# Patient Record
Sex: Male | Born: 1950 | Race: Black or African American | Hispanic: No | Marital: Married | Smoking: Current some day smoker
Health system: Southern US, Community
[De-identification: ages and names within clinical notes are randomized; demographics above are authoritative.]

## PROBLEM LIST (undated history)

## (undated) DIAGNOSIS — M1712 Unilateral primary osteoarthritis, left knee: Secondary | ICD-10-CM

## (undated) DIAGNOSIS — J449 Chronic obstructive pulmonary disease, unspecified: Secondary | ICD-10-CM

## (undated) DIAGNOSIS — I209 Angina pectoris, unspecified: Secondary | ICD-10-CM

## (undated) DIAGNOSIS — I2510 Atherosclerotic heart disease of native coronary artery without angina pectoris: Secondary | ICD-10-CM

## (undated) DIAGNOSIS — C801 Malignant (primary) neoplasm, unspecified: Secondary | ICD-10-CM

## (undated) DIAGNOSIS — K759 Inflammatory liver disease, unspecified: Secondary | ICD-10-CM

## (undated) DIAGNOSIS — E785 Hyperlipidemia, unspecified: Secondary | ICD-10-CM

## (undated) DIAGNOSIS — C189 Malignant neoplasm of colon, unspecified: Secondary | ICD-10-CM

## (undated) DIAGNOSIS — R0602 Shortness of breath: Secondary | ICD-10-CM

## (undated) DIAGNOSIS — I10 Essential (primary) hypertension: Secondary | ICD-10-CM

## (undated) DIAGNOSIS — D689 Coagulation defect, unspecified: Secondary | ICD-10-CM

## (undated) HISTORY — PX: REPLACEMENT TOTAL KNEE: SUR1224

## (undated) HISTORY — DX: Malignant neoplasm of colon, unspecified: C18.9

## (undated) HISTORY — DX: Hyperlipidemia, unspecified: E78.5

## (undated) HISTORY — DX: Chronic obstructive pulmonary disease, unspecified: J44.9

## (undated) HISTORY — PX: JOINT REPLACEMENT: SHX530

## (undated) HISTORY — DX: Coagulation defect, unspecified: D68.9

## (undated) HISTORY — PX: COLON SURGERY: SHX602

---

## 2000-02-03 ENCOUNTER — Encounter: Payer: Self-pay | Admitting: Emergency Medicine

## 2000-02-03 ENCOUNTER — Emergency Department (HOSPITAL_COMMUNITY): Admission: EM | Admit: 2000-02-03 | Discharge: 2000-02-03 | Payer: Self-pay | Admitting: Emergency Medicine

## 2000-02-07 ENCOUNTER — Encounter: Admission: RE | Admit: 2000-02-07 | Discharge: 2000-02-07 | Payer: Self-pay | Admitting: Internal Medicine

## 2000-02-08 ENCOUNTER — Encounter: Admission: RE | Admit: 2000-02-08 | Discharge: 2000-02-08 | Payer: Self-pay | Admitting: Hematology and Oncology

## 2000-03-24 ENCOUNTER — Encounter: Admission: RE | Admit: 2000-03-24 | Discharge: 2000-03-24 | Payer: Self-pay

## 2000-05-25 ENCOUNTER — Ambulatory Visit (HOSPITAL_COMMUNITY): Admission: RE | Admit: 2000-05-25 | Discharge: 2000-05-25 | Payer: Self-pay | Admitting: Gastroenterology

## 2000-05-25 ENCOUNTER — Encounter (INDEPENDENT_AMBULATORY_CARE_PROVIDER_SITE_OTHER): Payer: Self-pay | Admitting: Specialist

## 2000-05-31 ENCOUNTER — Encounter (HOSPITAL_BASED_OUTPATIENT_CLINIC_OR_DEPARTMENT_OTHER): Payer: Self-pay | Admitting: General Surgery

## 2000-06-01 ENCOUNTER — Inpatient Hospital Stay (HOSPITAL_COMMUNITY): Admission: RE | Admit: 2000-06-01 | Discharge: 2000-06-10 | Payer: Self-pay | Admitting: General Surgery

## 2000-06-01 ENCOUNTER — Encounter (INDEPENDENT_AMBULATORY_CARE_PROVIDER_SITE_OTHER): Payer: Self-pay

## 2000-06-02 ENCOUNTER — Encounter (HOSPITAL_BASED_OUTPATIENT_CLINIC_OR_DEPARTMENT_OTHER): Payer: Self-pay | Admitting: General Surgery

## 2000-06-05 ENCOUNTER — Encounter (HOSPITAL_BASED_OUTPATIENT_CLINIC_OR_DEPARTMENT_OTHER): Payer: Self-pay | Admitting: General Surgery

## 2000-06-09 ENCOUNTER — Encounter (HOSPITAL_BASED_OUTPATIENT_CLINIC_OR_DEPARTMENT_OTHER): Payer: Self-pay | Admitting: General Surgery

## 2000-06-28 ENCOUNTER — Encounter: Payer: Self-pay | Admitting: *Deleted

## 2000-06-28 ENCOUNTER — Ambulatory Visit (HOSPITAL_COMMUNITY): Admission: RE | Admit: 2000-06-28 | Discharge: 2000-06-28 | Payer: Self-pay | Admitting: Internal Medicine

## 2000-06-29 ENCOUNTER — Encounter: Admission: RE | Admit: 2000-06-29 | Discharge: 2000-09-27 | Payer: Self-pay | Admitting: Radiation Oncology

## 2000-07-21 ENCOUNTER — Ambulatory Visit (HOSPITAL_COMMUNITY): Admission: RE | Admit: 2000-07-21 | Discharge: 2000-07-21 | Payer: Self-pay | Admitting: General Surgery

## 2000-07-21 ENCOUNTER — Encounter (HOSPITAL_BASED_OUTPATIENT_CLINIC_OR_DEPARTMENT_OTHER): Payer: Self-pay | Admitting: General Surgery

## 2000-08-03 ENCOUNTER — Ambulatory Visit (HOSPITAL_COMMUNITY): Admission: RE | Admit: 2000-08-03 | Discharge: 2000-08-03 | Payer: Self-pay | Admitting: Gastroenterology

## 2000-08-03 ENCOUNTER — Encounter (INDEPENDENT_AMBULATORY_CARE_PROVIDER_SITE_OTHER): Payer: Self-pay | Admitting: Specialist

## 2000-09-29 ENCOUNTER — Inpatient Hospital Stay (HOSPITAL_COMMUNITY): Admission: EM | Admit: 2000-09-29 | Discharge: 2000-10-07 | Payer: Self-pay | Admitting: Emergency Medicine

## 2000-09-30 ENCOUNTER — Encounter (HOSPITAL_COMMUNITY): Payer: Self-pay | Admitting: Oncology

## 2000-10-02 ENCOUNTER — Encounter (HOSPITAL_COMMUNITY): Payer: Self-pay | Admitting: Oncology

## 2000-10-03 ENCOUNTER — Encounter (HOSPITAL_COMMUNITY): Payer: Self-pay | Admitting: Oncology

## 2000-10-06 ENCOUNTER — Encounter (HOSPITAL_COMMUNITY): Payer: Self-pay | Admitting: Oncology

## 2000-10-30 ENCOUNTER — Ambulatory Visit (HOSPITAL_COMMUNITY): Admission: RE | Admit: 2000-10-30 | Discharge: 2000-10-30 | Payer: Self-pay | Admitting: Oncology

## 2000-10-30 ENCOUNTER — Encounter (HOSPITAL_COMMUNITY): Payer: Self-pay | Admitting: Oncology

## 2001-03-03 ENCOUNTER — Encounter: Payer: Self-pay | Admitting: Emergency Medicine

## 2001-03-03 ENCOUNTER — Emergency Department (HOSPITAL_COMMUNITY): Admission: EM | Admit: 2001-03-03 | Discharge: 2001-03-03 | Payer: Self-pay | Admitting: Emergency Medicine

## 2001-03-14 DIAGNOSIS — I2510 Atherosclerotic heart disease of native coronary artery without angina pectoris: Secondary | ICD-10-CM | POA: Insufficient documentation

## 2001-03-16 ENCOUNTER — Encounter: Payer: Self-pay | Admitting: Cardiology

## 2001-03-16 ENCOUNTER — Ambulatory Visit (HOSPITAL_COMMUNITY): Admission: RE | Admit: 2001-03-16 | Discharge: 2001-03-16 | Payer: Self-pay | Admitting: Cardiology

## 2001-03-29 ENCOUNTER — Ambulatory Visit (HOSPITAL_COMMUNITY): Admission: RE | Admit: 2001-03-29 | Discharge: 2001-03-30 | Payer: Self-pay | Admitting: Cardiology

## 2001-04-15 ENCOUNTER — Inpatient Hospital Stay (HOSPITAL_COMMUNITY): Admission: EM | Admit: 2001-04-15 | Discharge: 2001-04-17 | Payer: Self-pay | Admitting: Emergency Medicine

## 2001-04-15 ENCOUNTER — Encounter: Payer: Self-pay | Admitting: Emergency Medicine

## 2001-04-16 ENCOUNTER — Encounter: Payer: Self-pay | Admitting: Cardiology

## 2001-06-22 ENCOUNTER — Encounter: Payer: Self-pay | Admitting: Gastroenterology

## 2001-06-22 ENCOUNTER — Encounter: Admission: RE | Admit: 2001-06-22 | Discharge: 2001-06-22 | Payer: Self-pay | Admitting: Gastroenterology

## 2001-07-02 ENCOUNTER — Ambulatory Visit: Admission: RE | Admit: 2001-07-02 | Discharge: 2001-07-02 | Payer: Self-pay | Admitting: Oncology

## 2001-07-12 ENCOUNTER — Ambulatory Visit (HOSPITAL_COMMUNITY): Admission: RE | Admit: 2001-07-12 | Discharge: 2001-07-12 | Payer: Self-pay | Admitting: Oncology

## 2001-07-12 ENCOUNTER — Encounter (HOSPITAL_COMMUNITY): Payer: Self-pay | Admitting: Oncology

## 2001-07-12 ENCOUNTER — Encounter (INDEPENDENT_AMBULATORY_CARE_PROVIDER_SITE_OTHER): Payer: Self-pay | Admitting: Specialist

## 2001-12-24 ENCOUNTER — Encounter (HOSPITAL_COMMUNITY): Payer: Self-pay | Admitting: Oncology

## 2001-12-24 ENCOUNTER — Ambulatory Visit (HOSPITAL_COMMUNITY): Admission: RE | Admit: 2001-12-24 | Discharge: 2001-12-24 | Payer: Self-pay | Admitting: Oncology

## 2002-01-08 ENCOUNTER — Ambulatory Visit (HOSPITAL_COMMUNITY): Admission: RE | Admit: 2002-01-08 | Discharge: 2002-01-08 | Payer: Self-pay | Admitting: Cardiology

## 2002-01-11 ENCOUNTER — Ambulatory Visit (HOSPITAL_COMMUNITY): Admission: RE | Admit: 2002-01-11 | Discharge: 2002-01-11 | Payer: Self-pay | Admitting: General Surgery

## 2002-04-22 ENCOUNTER — Encounter (INDEPENDENT_AMBULATORY_CARE_PROVIDER_SITE_OTHER): Payer: Self-pay | Admitting: Specialist

## 2002-04-22 ENCOUNTER — Ambulatory Visit (HOSPITAL_COMMUNITY): Admission: RE | Admit: 2002-04-22 | Discharge: 2002-04-22 | Payer: Self-pay | Admitting: Gastroenterology

## 2002-08-28 ENCOUNTER — Ambulatory Visit (HOSPITAL_COMMUNITY): Admission: RE | Admit: 2002-08-28 | Discharge: 2002-08-28 | Payer: Self-pay | Admitting: Cardiology

## 2002-08-28 ENCOUNTER — Encounter: Payer: Self-pay | Admitting: Cardiology

## 2002-10-15 ENCOUNTER — Ambulatory Visit (HOSPITAL_COMMUNITY): Admission: RE | Admit: 2002-10-15 | Discharge: 2002-10-15 | Payer: Self-pay | Admitting: Oncology

## 2002-10-15 ENCOUNTER — Encounter (HOSPITAL_COMMUNITY): Payer: Self-pay | Admitting: Oncology

## 2003-01-02 ENCOUNTER — Encounter (INDEPENDENT_AMBULATORY_CARE_PROVIDER_SITE_OTHER): Payer: Self-pay | Admitting: Specialist

## 2003-01-02 ENCOUNTER — Encounter: Payer: Self-pay | Admitting: Gastroenterology

## 2003-01-02 ENCOUNTER — Ambulatory Visit (HOSPITAL_COMMUNITY): Admission: RE | Admit: 2003-01-02 | Discharge: 2003-01-02 | Payer: Self-pay | Admitting: Gastroenterology

## 2003-01-20 ENCOUNTER — Ambulatory Visit (HOSPITAL_COMMUNITY): Admission: RE | Admit: 2003-01-20 | Discharge: 2003-01-20 | Payer: Self-pay | Admitting: Cardiology

## 2004-02-20 ENCOUNTER — Ambulatory Visit (HOSPITAL_COMMUNITY): Admission: RE | Admit: 2004-02-20 | Discharge: 2004-02-20 | Payer: Self-pay | Admitting: Oncology

## 2004-03-01 ENCOUNTER — Ambulatory Visit: Payer: Self-pay | Admitting: Oncology

## 2004-04-05 ENCOUNTER — Encounter (INDEPENDENT_AMBULATORY_CARE_PROVIDER_SITE_OTHER): Payer: Self-pay | Admitting: *Deleted

## 2004-04-05 ENCOUNTER — Ambulatory Visit (HOSPITAL_COMMUNITY): Admission: RE | Admit: 2004-04-05 | Discharge: 2004-04-05 | Payer: Self-pay | Admitting: Gastroenterology

## 2004-04-12 ENCOUNTER — Ambulatory Visit: Payer: Self-pay | Admitting: Gastroenterology

## 2004-06-25 ENCOUNTER — Ambulatory Visit: Payer: Self-pay | Admitting: Oncology

## 2004-09-06 ENCOUNTER — Ambulatory Visit: Payer: Self-pay | Admitting: Oncology

## 2005-04-07 ENCOUNTER — Ambulatory Visit: Payer: Self-pay | Admitting: Oncology

## 2005-04-08 ENCOUNTER — Ambulatory Visit (HOSPITAL_COMMUNITY): Admission: RE | Admit: 2005-04-08 | Discharge: 2005-04-08 | Payer: Self-pay | Admitting: Oncology

## 2005-04-13 ENCOUNTER — Ambulatory Visit: Payer: Self-pay | Admitting: Family Medicine

## 2005-04-26 ENCOUNTER — Ambulatory Visit: Payer: Self-pay | Admitting: *Deleted

## 2005-07-20 ENCOUNTER — Encounter (HOSPITAL_COMMUNITY): Admission: RE | Admit: 2005-07-20 | Discharge: 2005-10-18 | Payer: Self-pay | Admitting: Cardiology

## 2005-07-29 ENCOUNTER — Ambulatory Visit: Payer: Self-pay | Admitting: Gastroenterology

## 2005-09-27 ENCOUNTER — Ambulatory Visit: Payer: Self-pay | Admitting: Family Medicine

## 2005-10-18 ENCOUNTER — Ambulatory Visit: Payer: Self-pay | Admitting: Oncology

## 2005-11-26 ENCOUNTER — Inpatient Hospital Stay (HOSPITAL_COMMUNITY): Admission: EM | Admit: 2005-11-26 | Discharge: 2005-11-29 | Payer: Self-pay | Admitting: Emergency Medicine

## 2006-01-26 ENCOUNTER — Ambulatory Visit: Payer: Self-pay | Admitting: Oncology

## 2006-02-06 LAB — COMPREHENSIVE METABOLIC PANEL
AST: 52 U/L — ABNORMAL HIGH (ref 0–37)
Alkaline Phosphatase: 107 U/L (ref 39–117)
BUN: 9 mg/dL (ref 6–23)
Creatinine, Ser: 1.26 mg/dL (ref 0.40–1.50)
Glucose, Bld: 93 mg/dL (ref 70–99)

## 2006-02-06 LAB — LACTATE DEHYDROGENASE: LDH: 197 U/L (ref 94–250)

## 2006-02-06 LAB — CBC WITH DIFFERENTIAL/PLATELET
BASO%: 1.3 % (ref 0.0–2.0)
Basophils Absolute: 0.1 10e3/uL (ref 0.0–0.1)
EOS%: 1.4 % (ref 0.0–7.0)
Eosinophils Absolute: 0.1 10e3/uL (ref 0.0–0.5)
HCT: 46.5 % (ref 38.7–49.9)
HGB: 16.3 g/dL (ref 13.0–17.1)
LYMPH%: 29.3 % (ref 14.0–48.0)
MCH: 33.3 pg (ref 28.0–33.4)
MCHC: 35 g/dL (ref 32.0–35.9)
MCV: 95.2 fL (ref 81.6–98.0)
MONO#: 0.4 10e3/uL (ref 0.1–0.9)
MONO%: 5.9 % (ref 0.0–13.0)
NEUT#: 4.5 10e3/uL (ref 1.5–6.5)
NEUT%: 62 % (ref 40.0–75.0)
Platelets: 223 10e3/uL (ref 145–400)
RBC: 4.89 10e6/uL (ref 4.20–5.71)
RDW: 12 % (ref 11.2–14.6)
WBC: 7.3 10e3/uL (ref 4.0–10.0)
lymph#: 2.2 10e3/uL (ref 0.9–3.3)

## 2006-02-06 LAB — CEA: CEA: 0.7 ng/mL (ref 0.0–5.0)

## 2006-03-29 ENCOUNTER — Ambulatory Visit (HOSPITAL_COMMUNITY): Admission: RE | Admit: 2006-03-29 | Discharge: 2006-03-29 | Payer: Self-pay | Admitting: Oncology

## 2006-04-30 ENCOUNTER — Emergency Department (HOSPITAL_COMMUNITY): Admission: EM | Admit: 2006-04-30 | Discharge: 2006-05-01 | Payer: Self-pay | Admitting: Emergency Medicine

## 2006-07-27 ENCOUNTER — Ambulatory Visit: Payer: Self-pay | Admitting: Oncology

## 2006-08-21 ENCOUNTER — Ambulatory Visit (HOSPITAL_COMMUNITY): Admission: RE | Admit: 2006-08-21 | Discharge: 2006-08-21 | Payer: Self-pay | Admitting: Oncology

## 2006-08-21 LAB — COMPREHENSIVE METABOLIC PANEL
AST: 50 U/L — ABNORMAL HIGH (ref 0–37)
Albumin: 4 g/dL (ref 3.5–5.2)
Alkaline Phosphatase: 116 U/L (ref 39–117)
BUN: 13 mg/dL (ref 6–23)
Potassium: 4.1 mEq/L (ref 3.5–5.3)
Sodium: 140 mEq/L (ref 135–145)

## 2006-08-21 LAB — CBC WITH DIFFERENTIAL/PLATELET
Basophils Absolute: 0 10*3/uL (ref 0.0–0.1)
EOS%: 3 % (ref 0.0–7.0)
MCH: 33.7 pg — ABNORMAL HIGH (ref 28.0–33.4)
MCV: 95 fL (ref 81.6–98.0)
MONO%: 9.5 % (ref 0.0–13.0)
RBC: 4.51 10*6/uL (ref 4.20–5.71)
RDW: 13.4 % (ref 11.2–14.6)

## 2006-08-26 ENCOUNTER — Emergency Department (HOSPITAL_COMMUNITY): Admission: EM | Admit: 2006-08-26 | Discharge: 2006-08-26 | Payer: Self-pay | Admitting: Emergency Medicine

## 2006-09-05 ENCOUNTER — Ambulatory Visit: Payer: Self-pay | Admitting: Gastroenterology

## 2006-09-19 ENCOUNTER — Ambulatory Visit (HOSPITAL_COMMUNITY): Admission: RE | Admit: 2006-09-19 | Discharge: 2006-09-19 | Payer: Self-pay | Admitting: Cardiology

## 2006-09-19 ENCOUNTER — Encounter (INDEPENDENT_AMBULATORY_CARE_PROVIDER_SITE_OTHER): Payer: Self-pay | Admitting: Cardiology

## 2006-11-29 ENCOUNTER — Encounter (INDEPENDENT_AMBULATORY_CARE_PROVIDER_SITE_OTHER): Payer: Self-pay | Admitting: *Deleted

## 2007-02-15 ENCOUNTER — Ambulatory Visit: Payer: Self-pay | Admitting: Oncology

## 2007-02-19 LAB — COMPREHENSIVE METABOLIC PANEL
ALT: 51 U/L (ref 0–53)
Alkaline Phosphatase: 127 U/L — ABNORMAL HIGH (ref 39–117)
Creatinine, Ser: 1.14 mg/dL (ref 0.40–1.50)
Sodium: 140 mEq/L (ref 135–145)
Total Bilirubin: 0.4 mg/dL (ref 0.3–1.2)
Total Protein: 7.3 g/dL (ref 6.0–8.3)

## 2007-02-19 LAB — CBC WITH DIFFERENTIAL/PLATELET
BASO%: 2.1 % — ABNORMAL HIGH (ref 0.0–2.0)
Basophils Absolute: 0.1 10*3/uL (ref 0.0–0.1)
EOS%: 1.3 % (ref 0.0–7.0)
Eosinophils Absolute: 0.1 10*3/uL (ref 0.0–0.5)
HCT: 44.4 % (ref 38.7–49.9)
HGB: 15.7 g/dL (ref 13.0–17.1)
LYMPH%: 33.9 % (ref 14.0–48.0)
MCH: 33.9 pg — ABNORMAL HIGH (ref 28.0–33.4)
MCHC: 35.3 g/dL (ref 32.0–35.9)
MCV: 96.1 fL (ref 81.6–98.0)
MONO#: 0.5 10*3/uL (ref 0.1–0.9)
MONO%: 7.5 % (ref 0.0–13.0)
NEUT#: 3.6 10*3/uL (ref 1.5–6.5)
NEUT%: 55.2 % (ref 40.0–75.0)
Platelets: 212 10*3/uL (ref 145–400)
RBC: 4.62 10*6/uL (ref 4.20–5.71)
RDW: 14 % (ref 11.2–14.6)
WBC: 6.5 10*3/uL (ref 4.0–10.0)
lymph#: 2.2 10*3/uL (ref 0.9–3.3)

## 2007-02-19 LAB — LACTATE DEHYDROGENASE: LDH: 188 U/L (ref 94–250)

## 2007-02-19 LAB — CEA: CEA: <0.5 ng/mL (ref 0.0–5.0)

## 2007-03-19 ENCOUNTER — Encounter (INDEPENDENT_AMBULATORY_CARE_PROVIDER_SITE_OTHER): Payer: Self-pay | Admitting: Nurse Practitioner

## 2007-04-02 ENCOUNTER — Telehealth (INDEPENDENT_AMBULATORY_CARE_PROVIDER_SITE_OTHER): Payer: Self-pay | Admitting: *Deleted

## 2007-05-02 ENCOUNTER — Ambulatory Visit: Payer: Self-pay | Admitting: Nurse Practitioner

## 2007-05-02 DIAGNOSIS — I10 Essential (primary) hypertension: Secondary | ICD-10-CM

## 2007-05-28 ENCOUNTER — Ambulatory Visit: Payer: Self-pay | Admitting: Family Medicine

## 2007-05-29 DIAGNOSIS — B1710 Acute hepatitis C without hepatic coma: Secondary | ICD-10-CM | POA: Insufficient documentation

## 2007-05-29 DIAGNOSIS — I10 Essential (primary) hypertension: Secondary | ICD-10-CM | POA: Insufficient documentation

## 2007-05-29 DIAGNOSIS — E785 Hyperlipidemia, unspecified: Secondary | ICD-10-CM | POA: Insufficient documentation

## 2007-05-31 ENCOUNTER — Encounter (INDEPENDENT_AMBULATORY_CARE_PROVIDER_SITE_OTHER): Payer: Self-pay | Admitting: Family Medicine

## 2007-07-20 ENCOUNTER — Encounter (INDEPENDENT_AMBULATORY_CARE_PROVIDER_SITE_OTHER): Payer: Self-pay | Admitting: Family Medicine

## 2007-08-16 ENCOUNTER — Ambulatory Visit: Payer: Self-pay | Admitting: Oncology

## 2007-09-05 ENCOUNTER — Ambulatory Visit: Payer: Self-pay | Admitting: Family Medicine

## 2007-09-05 ENCOUNTER — Telehealth (INDEPENDENT_AMBULATORY_CARE_PROVIDER_SITE_OTHER): Payer: Self-pay | Admitting: Family Medicine

## 2007-09-05 DIAGNOSIS — M62838 Other muscle spasm: Secondary | ICD-10-CM | POA: Insufficient documentation

## 2007-09-10 ENCOUNTER — Encounter (INDEPENDENT_AMBULATORY_CARE_PROVIDER_SITE_OTHER): Payer: Self-pay | Admitting: Family Medicine

## 2007-09-10 LAB — CBC WITH DIFFERENTIAL/PLATELET
BASO%: 0.8 % (ref 0.0–2.0)
Basophils Absolute: 0.1 10*3/uL (ref 0.0–0.1)
EOS%: 3.1 % (ref 0.0–7.0)
HCT: 47 % (ref 38.7–49.9)
LYMPH%: 36.7 % (ref 14.0–48.0)
MCH: 33.2 pg (ref 28.0–33.4)
MCHC: 34.6 g/dL (ref 32.0–35.9)
MCV: 96.2 fL (ref 81.6–98.0)
MONO%: 11.7 % (ref 0.0–13.0)
NEUT%: 47.7 % (ref 40.0–75.0)
lymph#: 2.7 10*3/uL (ref 0.9–3.3)

## 2007-09-10 LAB — COMPREHENSIVE METABOLIC PANEL
ALT: 84 U/L — ABNORMAL HIGH (ref 0–53)
AST: 101 U/L — ABNORMAL HIGH (ref 0–37)
Alkaline Phosphatase: 132 U/L — ABNORMAL HIGH (ref 39–117)
BUN: 14 mg/dL (ref 6–23)
Chloride: 107 mEq/L (ref 96–112)
Creatinine, Ser: 1.18 mg/dL (ref 0.40–1.50)
Total Bilirubin: 0.4 mg/dL (ref 0.3–1.2)

## 2007-09-10 LAB — LACTATE DEHYDROGENASE: LDH: 247 U/L (ref 94–250)

## 2007-09-18 ENCOUNTER — Telehealth (INDEPENDENT_AMBULATORY_CARE_PROVIDER_SITE_OTHER): Payer: Self-pay | Admitting: *Deleted

## 2007-09-25 LAB — CONVERTED CEMR LAB
ALT: 73 units/L — ABNORMAL HIGH (ref 0–53)
Albumin: 4 g/dL (ref 3.5–5.2)
Basophils Absolute: 0 10*3/uL (ref 0.0–0.1)
CO2: 23 meq/L (ref 19–32)
Calcium: 9 mg/dL (ref 8.4–10.5)
Chloride: 105 meq/L (ref 96–112)
Cholesterol: 147 mg/dL (ref 0–200)
Creatinine, Ser: 1.25 mg/dL (ref 0.40–1.50)
Hemoglobin: 16.3 g/dL (ref 13.0–17.0)
Lymphocytes Relative: 37 % (ref 12–46)
Monocytes Absolute: 0.6 10*3/uL (ref 0.1–1.0)
Monocytes Relative: 10 % (ref 3–12)
Neutro Abs: 2.9 10*3/uL (ref 1.7–7.7)
Neutrophils Relative %: 50 % (ref 43–77)
PSA: 1.4 ng/mL (ref 0.10–4.00)
Potassium: 5.3 meq/L (ref 3.5–5.3)
RBC: 4.9 M/uL (ref 4.22–5.81)
RDW: 14.1 % (ref 11.5–15.5)
TSH: 1.063 microintl units/mL (ref 0.350–5.50)
Total CHOL/HDL Ratio: 2.8

## 2007-09-27 ENCOUNTER — Ambulatory Visit: Payer: Self-pay | Admitting: Nurse Practitioner

## 2007-09-27 DIAGNOSIS — L02239 Carbuncle of trunk, unspecified: Secondary | ICD-10-CM | POA: Insufficient documentation

## 2007-09-27 DIAGNOSIS — R209 Unspecified disturbances of skin sensation: Secondary | ICD-10-CM | POA: Insufficient documentation

## 2007-09-27 DIAGNOSIS — L02229 Furuncle of trunk, unspecified: Secondary | ICD-10-CM

## 2007-09-27 LAB — CONVERTED CEMR LAB
Iron: 119 ug/dL (ref 42–165)
Vitamin B-12: 414 pg/mL (ref 211–911)

## 2007-10-01 ENCOUNTER — Ambulatory Visit: Payer: Self-pay | Admitting: Family Medicine

## 2007-10-01 DIAGNOSIS — L723 Sebaceous cyst: Secondary | ICD-10-CM

## 2007-10-02 ENCOUNTER — Encounter (INDEPENDENT_AMBULATORY_CARE_PROVIDER_SITE_OTHER): Payer: Self-pay | Admitting: Family Medicine

## 2007-10-03 ENCOUNTER — Ambulatory Visit: Payer: Self-pay | Admitting: Family Medicine

## 2007-10-05 ENCOUNTER — Ambulatory Visit: Payer: Self-pay | Admitting: Family Medicine

## 2007-10-09 ENCOUNTER — Ambulatory Visit: Payer: Self-pay | Admitting: Family Medicine

## 2007-11-01 ENCOUNTER — Ambulatory Visit (HOSPITAL_COMMUNITY): Admission: RE | Admit: 2007-11-01 | Discharge: 2007-11-01 | Payer: Self-pay | Admitting: Gastroenterology

## 2007-11-01 ENCOUNTER — Encounter (INDEPENDENT_AMBULATORY_CARE_PROVIDER_SITE_OTHER): Payer: Self-pay | Admitting: Gastroenterology

## 2007-11-12 ENCOUNTER — Ambulatory Visit: Payer: Self-pay | Admitting: Family Medicine

## 2008-01-02 ENCOUNTER — Telehealth (INDEPENDENT_AMBULATORY_CARE_PROVIDER_SITE_OTHER): Payer: Self-pay | Admitting: *Deleted

## 2008-02-01 ENCOUNTER — Encounter (INDEPENDENT_AMBULATORY_CARE_PROVIDER_SITE_OTHER): Payer: Self-pay | Admitting: Family Medicine

## 2008-04-29 ENCOUNTER — Ambulatory Visit: Payer: Self-pay | Admitting: Gastroenterology

## 2008-04-29 ENCOUNTER — Encounter (INDEPENDENT_AMBULATORY_CARE_PROVIDER_SITE_OTHER): Payer: Self-pay | Admitting: Family Medicine

## 2008-06-27 ENCOUNTER — Encounter (INDEPENDENT_AMBULATORY_CARE_PROVIDER_SITE_OTHER): Payer: Self-pay | Admitting: Family Medicine

## 2008-08-11 ENCOUNTER — Encounter (INDEPENDENT_AMBULATORY_CARE_PROVIDER_SITE_OTHER): Payer: Self-pay | Admitting: Family Medicine

## 2008-09-19 ENCOUNTER — Ambulatory Visit: Payer: Self-pay | Admitting: Oncology

## 2008-09-22 ENCOUNTER — Encounter (INDEPENDENT_AMBULATORY_CARE_PROVIDER_SITE_OTHER): Payer: Self-pay | Admitting: Family Medicine

## 2008-09-23 ENCOUNTER — Encounter: Payer: Self-pay | Admitting: Physician Assistant

## 2008-09-23 ENCOUNTER — Ambulatory Visit (HOSPITAL_COMMUNITY): Admission: RE | Admit: 2008-09-23 | Discharge: 2008-09-23 | Payer: Self-pay | Admitting: Oncology

## 2008-09-23 LAB — COMPREHENSIVE METABOLIC PANEL
ALT: 78 U/L — ABNORMAL HIGH (ref 0–53)
AST: 83 U/L — ABNORMAL HIGH (ref 0–37)
CO2: 24 mEq/L (ref 19–32)
Chloride: 105 mEq/L (ref 96–112)
Sodium: 141 mEq/L (ref 135–145)
Total Bilirubin: 1.1 mg/dL (ref 0.3–1.2)
Total Protein: 7.1 g/dL (ref 6.0–8.3)

## 2008-09-23 LAB — CBC WITH DIFFERENTIAL/PLATELET
BASO%: 0.4 % (ref 0.0–2.0)
EOS%: 1.4 % (ref 0.0–7.0)
LYMPH%: 29.1 % (ref 14.0–49.0)
MCHC: 33.8 g/dL (ref 32.0–36.0)
MONO#: 0.4 10*3/uL (ref 0.1–0.9)
RBC: 4.59 10*6/uL (ref 4.20–5.82)
WBC: 7.4 10*3/uL (ref 4.0–10.3)
lymph#: 2.2 10*3/uL (ref 0.9–3.3)

## 2008-09-23 LAB — LACTATE DEHYDROGENASE: LDH: 185 U/L (ref 94–250)

## 2009-01-28 ENCOUNTER — Telehealth: Payer: Self-pay | Admitting: Physician Assistant

## 2009-03-04 ENCOUNTER — Encounter (HOSPITAL_COMMUNITY): Admission: RE | Admit: 2009-03-04 | Discharge: 2009-03-13 | Payer: Self-pay | Admitting: Cardiology

## 2009-03-15 ENCOUNTER — Telehealth: Payer: Self-pay | Admitting: Physician Assistant

## 2009-05-05 ENCOUNTER — Telehealth (INDEPENDENT_AMBULATORY_CARE_PROVIDER_SITE_OTHER): Payer: Self-pay | Admitting: Nurse Practitioner

## 2009-05-12 ENCOUNTER — Ambulatory Visit: Payer: Self-pay | Admitting: Nurse Practitioner

## 2009-05-12 LAB — CONVERTED CEMR LAB: LDL Goal: 100 mg/dL

## 2009-05-13 ENCOUNTER — Encounter (INDEPENDENT_AMBULATORY_CARE_PROVIDER_SITE_OTHER): Payer: Self-pay | Admitting: Nurse Practitioner

## 2009-05-13 LAB — CONVERTED CEMR LAB
ALT: 55 units/L — ABNORMAL HIGH (ref 0–53)
Basophils Relative: 1 % (ref 0–1)
CO2: 20 meq/L (ref 19–32)
Calcium: 8.8 mg/dL (ref 8.4–10.5)
Chloride: 103 meq/L (ref 96–112)
Cholesterol: 167 mg/dL (ref 0–200)
Creatinine, Ser: 1.25 mg/dL (ref 0.40–1.50)
Eosinophils Absolute: 0.1 10*3/uL (ref 0.0–0.7)
Eosinophils Relative: 1 % (ref 0–5)
Glucose, Bld: 92 mg/dL (ref 70–99)
HCT: 45.4 % (ref 39.0–52.0)
Hemoglobin: 15 g/dL (ref 13.0–17.0)
Lymphs Abs: 2 10*3/uL (ref 0.7–4.0)
MCHC: 33 g/dL (ref 30.0–36.0)
MCV: 95.8 fL (ref 78.0–100.0)
Microalb, Ur: 1.82 mg/dL (ref 0.00–1.89)
Monocytes Absolute: 0.4 10*3/uL (ref 0.1–1.0)
Monocytes Relative: 8 % (ref 3–12)
PSA: 0.9 ng/mL (ref 0.10–4.00)
RBC: 4.74 M/uL (ref 4.22–5.81)
Total Bilirubin: 0.4 mg/dL (ref 0.3–1.2)
Total CHOL/HDL Ratio: 2.8
Total Protein: 7.3 g/dL (ref 6.0–8.3)
Triglycerides: 51 mg/dL (ref <150)
VLDL: 10 mg/dL (ref 0–40)
WBC: 5.6 10*3/uL (ref 4.0–10.5)

## 2009-07-07 ENCOUNTER — Encounter (INDEPENDENT_AMBULATORY_CARE_PROVIDER_SITE_OTHER): Payer: Self-pay | Admitting: Nurse Practitioner

## 2009-07-07 LAB — CONVERTED CEMR LAB
HDL: 51 mg/dL (ref >39)
Total CHOL/HDL Ratio: 3.2
Triglycerides: 153 mg/dL — ABNORMAL HIGH (ref <150)

## 2009-08-19 ENCOUNTER — Ambulatory Visit: Payer: Self-pay | Admitting: Nurse Practitioner

## 2009-08-19 DIAGNOSIS — H6120 Impacted cerumen, unspecified ear: Secondary | ICD-10-CM

## 2009-08-24 ENCOUNTER — Ambulatory Visit: Payer: Self-pay | Admitting: Nurse Practitioner

## 2009-09-01 ENCOUNTER — Ambulatory Visit: Payer: Self-pay | Admitting: Nurse Practitioner

## 2009-09-22 ENCOUNTER — Ambulatory Visit: Payer: Self-pay | Admitting: Oncology

## 2009-09-25 ENCOUNTER — Encounter: Payer: Self-pay | Admitting: Physician Assistant

## 2009-09-28 LAB — CBC WITH DIFFERENTIAL/PLATELET
BASO%: 0.3 % (ref 0.0–2.0)
EOS%: 1.7 % (ref 0.0–7.0)
HCT: 43.7 % (ref 38.4–49.9)
LYMPH%: 31.6 % (ref 14.0–49.0)
MCH: 33.3 pg (ref 27.2–33.4)
MCHC: 34 g/dL (ref 32.0–36.0)
MONO%: 7.3 % (ref 0.0–14.0)
NEUT%: 59.1 % (ref 39.0–75.0)
Platelets: 203 10*3/uL (ref 140–400)
RBC: 4.47 10*6/uL (ref 4.20–5.82)

## 2009-09-28 LAB — COMPREHENSIVE METABOLIC PANEL
ALT: 70 U/L — ABNORMAL HIGH (ref 0–53)
AST: 79 U/L — ABNORMAL HIGH (ref 0–37)
Alkaline Phosphatase: 72 U/L (ref 39–117)
Creatinine, Ser: 1.23 mg/dL (ref 0.40–1.50)
Sodium: 140 mEq/L (ref 135–145)
Total Bilirubin: 0.8 mg/dL (ref 0.3–1.2)

## 2009-09-28 LAB — LACTATE DEHYDROGENASE: LDH: 196 U/L (ref 94–250)

## 2009-10-16 ENCOUNTER — Emergency Department (HOSPITAL_COMMUNITY): Admission: EM | Admit: 2009-10-16 | Discharge: 2009-10-16 | Payer: Self-pay | Admitting: Emergency Medicine

## 2009-11-07 ENCOUNTER — Telehealth: Payer: Self-pay | Admitting: Physician Assistant

## 2010-03-19 ENCOUNTER — Telehealth (INDEPENDENT_AMBULATORY_CARE_PROVIDER_SITE_OTHER): Payer: Self-pay | Admitting: Nurse Practitioner

## 2010-04-15 NOTE — Assessment & Plan Note (Signed)
Summary: HTN   Vital Signs:  Patient profile:   60 year old male Height:      71.25 inches Weight:      207.4 pounds BMI:     28.83 BSA:     2.15 Temp:     97.7 degrees F oral Pulse rate:   56 / minute Pulse rhythm:   regular Resp:     16 per minute BP sitting:   163 / 97  (left arm) Cuff size:   regular  Vitals Entered By: Levon Hedger (May 12, 2009 10:11 AM) CC: Hypertension Management, Lipid Management Is Patient Diabetic? No Pain Assessment Patient in pain? no       Does patient need assistance? Functional Status Self care Ambulation Normal   CC:  Hypertension Management and Lipid Management.  History of Present Illness:  Pt into the office for follow up for hypertension.  Pt has been out of medications for almost 1 week. Pt is fasting today for labs  No acute complaints today. Reports that he maintains f/u at cardiology and just had a stress test done which was negative.  Hypertension History:      He denies headache, chest pain, and palpitations.  He notes no problems with any antihypertensive medication side effects.  no side effects when pt is taking the medications although he has had meds in almost 1 week. He needs new Rx sent to pharmacy.        Positive major cardiovascular risk factors include male age 43 years old or older, hyperlipidemia, and hypertension.  Negative major cardiovascular risk factors include no history of diabetes and non-tobacco-user status.        Positive history for target organ damage include ASHD (either angina/prior MI/prior CABG).  Further assessment for target organ damage reveals no history of stroke/TIA or peripheral vascular disease.    Lipid Management History:      Positive NCEP/ATP III risk factors include male age 22 years old or older, hypertension, and ASHD (either angina/prior MI/prior CABG).  Negative NCEP/ATP III risk factors include non-diabetic, non-tobacco-user status, no prior stroke/TIA, no peripheral  vascular disease, and no history of aortic aneurysm.        The patient states that he knows about the "Therapeutic Lifestyle Change" diet.  The patient expresses understanding of adjunctive measures for cholesterol lowering.  He expresses no side effects from his lipid-lowering medication.  The patient denies any symptoms to suggest myopathy or liver disease.      Habits & Providers  Alcohol-Tobacco-Diet     Alcohol drinks/day: 1     Alcohol Counseling: to decrease amount and/or frequency of alcohol intake     Alcohol type: beer     Tobacco Status: quit     Tobacco Counseling: to remain off tobacco products     Cigarette Packs/Day: <0.25     Year Quit: 04/2009  Exercise-Depression-Behavior     Have you felt down or hopeless? no     Have you felt little pleasure in things? no     Depression Counseling: not indicated; screening negative for depression     Drug Use: no     Seat Belt Use: 100     Sun Exposure: occasionally  Comments: Quit smoking 4 days ago  Allergies (verified): 1)  ! Sulfa  Social History: Smoking Status:  quit Packs/Day:  <0.25  Review of Systems General:  Denies fever. CV:  Denies chest pain or discomfort. Resp:  Denies cough. GI:  Denies abdominal  pain, nausea, and vomiting.  Physical Exam  General:  alert.   Head:  normocephalic.   Lungs:  normal breath sounds.   Heart:  normal rate and regular rhythm.   Abdomen:  normal bowel sounds.   Msk:  active ROM Neurologic:  alert & oriented X3.   Skin:  color normal.   Psych:  Oriented X3.     Impression & Recommendations:  Problem # 1:  HYPERTENSION, BENIGN (ICD-401.1) DASH diet BP elevated today, pt out of meds for 1 week - will restart His updated medication list for this problem includes:    Lisinopril 20 Mg Tabs (Lisinopril) ..... One tablet by mouth for blood pressure  Orders: T-Comprehensive Metabolic Panel [phone]) T-PSA ([id number]) T-CBC w/Diff ([id number]) T-TSH  ([id number]) T-Urine Microalbumin w/creat. ratio [id number]) UA Dipstick w/o Micro (manual) (53664)  Problem # 2:  HYPERLIPIDEMIA (ICD-272.4) Will check labs today His updated medication list for this problem includes:    Lipitor 10 Mg Tabs (Atorvastatin calcium) ..... One tablet by mouth daily for choleserol  Orders: T-Lipid Profile [id number]) T-Comprehensive Metabolic Panel [id number]) T-CBC w/Diff (95188-41660) Rapid HIV  (63016)  Problem # 3:  HEPATITIS C (ICD-070.51) previous f/u at [facility]  from 2004-2008 but pt continued to defer treatment  Problem # 4:  CORONARY ARTERY DISEASE (ICD-414.00) continue f/u with cardiology His updated medication list for this problem includes:    Lisinopril 20 Mg Tabs (Lisinopril) ..... One tablet by mouth for blood pressure  Complete Medication List: 1)  Lipitor 10 Mg Tabs (Atorvastatin calcium) .... One tablet by mouth daily for choleserol 2)  Lisinopril 20 Mg Tabs (Lisinopril) .... One tablet by mouth for blood pressure  Other Orders: Clonidine 0.1mg  tab [facility])  Hypertension Assessment/Plan:      The patient's hypertensive risk group is category C: Target organ damage and/or diabetes.  Today's blood pressure is 163/97.  His blood pressure goal is < 140/90.  Lipid Assessment/Plan:      Based on NCEP/ATP III, the patient's risk factor category is "history of coronary disease, peripheral vascular disease, cerebrovascular disease, or aortic aneurysm".  The patient's lipid goals are as follows: Total cholesterol goal is 200; LDL cholesterol goal is 100; HDL cholesterol goal is 40; Triglyceride goal is 150.    Patient Instructions: 1)  Restart your medications for blood pressure and blood sugar. 2)  You will be notified of any abnormal labs. 3)  Follow up in this office in 2-3 weeks for nurse visit - blood pressure check.  Take your medicatios before this visit. Prescriptions: LISINOPRIL 20 MG  TABS  (LISINOPRIL) One tablet by mouth for blood pressure  #30 x 5   Entered and Authorized by:   Lehman Prom FNP   Signed by:   Lehman Prom FNP on 05/12/2009   Method used:   Printed then faxed to ...       [facility] - [facility] (retail)       [address] [place], [place]  [address]       Ph: [phone]       Fax: [phone]   RxID:   [id number] LIPITOR 10 MG  TABS (ATORVASTATIN CALCIUM) One tablet by mouth daily for choleserol  #30 x 5   Entered and Authorized by:   Lehman Prom FNP   Signed by:   Lehman Prom FNP on 05/12/2009   Method used:   Printed then faxed to .Marland KitchenMarland Kitchen  [facility] - Pharmac (retail)       [address] [place], [place]  [address]       Ph: [phone]       Fax: [phone]   RxID:   [id number] LISINOPRIL 20 MG  TABS (LISINOPRIL) One tablet by mouth for blood pressure  #30 x 5   Entered and Authorized by:   Lehman Prom FNP   Signed by:   Lehman Prom FNP on 05/12/2009   Method used:   Faxed to ...       [facility] - Pharmac (retail)       [address] [place], [place]  [address]       Ph: [phone]       Fax: [phone]   RxID:   [id number] LIPITOR 10 MG  TABS (ATORVASTATIN CALCIUM) One tablet by mouth daily for choleserol  #30 x 5   Entered and Authorized by:   Lehman Prom FNP   Signed by:   Lehman Prom FNP on 05/12/2009   Method used:   Faxed to ...       [facility] - Pharmac (retail)       [address] [place], [place]  [address]       Ph: [phone]       Fax: [phone]   RxID:   [id number]    Medication Administration  Medication # 1:    Medication: Clonidine 0.1mg  tab    Diagnosis: HYPERTENSION (ICD-401.9)    Dose: 1 tablet    Route: po    Exp Date: 02/2010    Lot #: [id number]    Mfr: mylan pharmaceutical    Patient tolerated  medication without complications    Given by: Levon Hedger (May 12, 2009 11:23 AM)  Orders Added: 1)  T-Lipid Profile [id number] 2)  T-Comprehensive Metabolic Panel [[id number]] 3)  T-PSA [01751-02585] 4)  T-CBC w/Diff [27782-42353] 5)  Rapid HIV  [92370] 6)  T-TSH [61443-15400] 7)  T-Urine Microalbumin w/creat. ratio [[id number]] 8)  UA Dipstick w/o Micro (manual) [81002] 9)  Est. Patient Level III [86761] 10)  Clonidine 0.1mg  tab [EMRORAL]  Laboratory Results  Date/Time Received: May 12, 2009 11:41 AM  Date/Time Reported: May 12, 2009 11:41 AM   Other Tests  Rapid HIV: negative   Prevention & Chronic Care Immunizations   Influenza vaccine: Not documented   Influenza vaccine deferral: Refused  (05/12/2009)    Tetanus booster: Not documented   Td booster deferral: Not available  (05/12/2009)    Pneumococcal vaccine: Not documented  Colorectal Screening   Hemoccult: Not documented    Colonoscopy: cecal polyp/anastamosis in sigmoid/trans polyp/hemhorroids/PATH=tub adenoma/hyperplastic  (11/01/2007)  Other Screening   PSA: 1.40  (09/05/2007)   PSA ordered.   Smoking status: quit  (05/12/2009)  Lipids   Total Cholesterol: 147  (09/05/2007)   LDL: 84  (09/05/2007)   LDL Direct: Not documented   HDL: 52  (09/05/2007)   Triglycerides: 56  (09/05/2007)    SGOT (AST): 82  (09/05/2007)   SGPT (ALT): 73  (09/05/2007) CMP ordered    Alkaline phosphatase: 86  (09/05/2007)   Total bilirubin: 0.7  (09/05/2007)  Hypertension   Last Blood Pressure: 163 / 97  (05/12/2009)   Serum creatinine: 1.25  (09/05/2007)   Serum potassium 5.3  (09/05/2007) CMP ordered   Self-Management Support :  Hypertension self-management support: Not documented    Lipid self-management support: Not documented

## 2010-04-15 NOTE — Progress Notes (Signed)
Summary: Query:  Refill lisinopril?  Phone Note Outgoing Call   Summary of Call: Last seen 08/2009 - was supposed to call for f/u  after getting eligibility renewed - has not contacted this office.  Phone # disconnected.-checked with pharmacy, no other number.  Refill lisinopril? Initial call taken by: Dutch Quint RN,  March 19, 2010 12:45 PM  Follow-up for Phone Call        yes, will refill Follow-up by: Lehman Prom FNP,  March 19, 2010 1:08 PM

## 2010-04-15 NOTE — Progress Notes (Signed)
Summary: No more refills until he has follow up appt.   Phone Note Outgoing Call   Summary of Call: Patient needs follow up appt. This is the last refill I will approve.  He cannot have any more refills until he has a f/u appt.   Follow-up for Phone Call        appt is scheudled Follow-up by: Armenia Shannon,  March 17, 2009 8:58 AM    Prescriptions: LISINOPRIL 20 MG  TABS (LISINOPRIL) 1 by mouth once daily  #30 x 0   Entered and Authorized by:   Tereso Newcomer PA-C   Signed by:   Tereso Newcomer PA-C on 03/15/2009   Method used:   Faxed to ...       [facility] - Pharmac (retail)       [address] [place], [place]  [address]       Ph: [phone]       Fax: [phone]   RxID:   [id number] LIPITOR 10 MG  TABS (ATORVASTATIN CALCIUM) 1 by mouth once daily  #30 x 0   Entered and Authorized by:   Tereso Newcomer PA-C   Signed by:   Tereso Newcomer PA-C on 03/15/2009   Method used:   Faxed to ...       [facility] - Pharmac (retail)       [address] [place], [place]  [address]       Ph: [phone]       Fax: [phone]   RxID:   [id number]

## 2010-04-15 NOTE — Assessment & Plan Note (Signed)
Summary: ear irrigation//kt  Nurse Visit   Vitals Entered By: Levon Hedger (September 01, 2009 12:36 PM) CC: right ear irrigation.   Allergies: 1)  ! Sulfa  Orders Added: 1)  Est. Patient Level I [78295]

## 2010-04-15 NOTE — Letter (Signed)
Summary: *HSN Results Follow up  [facility]  [address] [place], [place] [address]   Phone: [phone]  Fax: [phone]      05/13/2009   [name] [name] [address] [place], [place]  [address]   Dear  [name],                            ____S.Drinkard,FNP   ____D. Gore,FNP       ____B. McPherson,MD   ____V. Rankins,MD    ____E. Mulberry,MD    _X___N. Daphine Deutscher, FNP  ____D. Reche Dixon, MD    ____K. Philipp Deputy, MD    ____Other     This letter is to inform you that your recent test(s):  _______Pap Smear    ___X____Lab Test     _______X-ray   Comments:  Liver labs were elevated during your recent office visit.  This has been the case for several years.  You should be aware of the case for this but if you need further explanation call the office. All other labs were normal.      _________________________________________________________ If you have any questions, please contact our office [phone].                    Sincerely,    Lehman Prom FNP [facility]

## 2010-04-15 NOTE — Letter (Signed)
Summary: Lipid Letter  [facility]  [address] [place], [place] [address]   Phone: [phone]  Fax: [phone]    07/07/2009  Malikye Reppond [address] [place], [place]  [address]  Dear Ivar Drape:  We have carefully reviewed your last lipid profile from 07/07/2009 and the results are noted below with a summary of recommendations for lipid management.    Cholesterol:       164     Goal: less than 200   HDL "good" Cholesterol:   51     Goal: greater than 40   LDL "bad" Cholesterol:   82     Goal: less than 70   Triglycerides:       153     Goal: less than 150    Cholesterol labs are overall improved.  Continue taking medications at current dose.      Current Medications: 1)    Lipitor 10 Mg  Tabs (Atorvastatin calcium) .... One tablet by mouth daily for choleserol 2)    Lisinopril 20 Mg  Tabs (Lisinopril) .... One tablet by mouth for blood pressure  If you have any questions, please call. We appreciate being able to work with you.   Sincerely,    Lehman Prom, FNP [facility]

## 2010-04-15 NOTE — Progress Notes (Signed)
Summary: Needs F/u visit   Phone Note Outgoing Call   Summary of Call: I rec'd another refill request for lisinopril and lipitor. He has no showed to 4 appts since Aug. He has another appt March 1. Meds last filled 1.3.2011 . . . so he has been out of meds for a couple weeks. Find out if he plans to follow up with [place].  Make sure he has received a copy of the no show policy.  Initial call taken by: Brynda Rim,  May 05, 2009 8:32 AM  Follow-up for Phone Call        number is disconnected....Marland KitchenMarland KitchenArmenia Shannon  May 05, 2009 11:32 AM   Additional Follow-up for Phone Call Additional follow up Details #1::        this pt on my schedule for tomorrow I see that he has no-showed with [facility] several times appears to be a previous Rankins pt and has not re-established with any provider  Additional Follow-up by: Lehman Prom FNP,  May 11, 2009 2:20 PM    Additional Follow-up for Phone Call Additional follow up Details #2::    pt seen in office on 05/12/2009 Follow-up by: Lehman Prom FNP,  May 13, 2009 7:30 AM

## 2010-04-15 NOTE — Assessment & Plan Note (Signed)
Summary: Acute - Ear impaction   Vital Signs:  Patient profile:   60 year old male Weight:      206.9 pounds Temp:     97.4 degrees F oral Pulse rate:   67 / minute Pulse rhythm:   regular Resp:     16 per minute BP sitting:   158 / 105  (left arm) Cuff size:   regular  Vitals Entered By: Levon Hedger (August 19, 2009 10:51 AM) CC: went to have hearing test and he has q-tip stuck in one ear and a build up of wax in the other, Hypertension Management Is Patient Diabetic? No Pain Assessment Patient in pain? no       Does patient need assistance? Functional Status Self care Ambulation Normal   CC:  went to have hearing test and he has q-tip stuck in one ear and a build up of wax in the other and Hypertension Management.  History of Present Illness:  Pt into the office with foriegn body in his ear. Left ear - used a q-tip and cotton tip broke off in the left ear Right ear - wax build up Pt went for a hearing evaluation and was informed of the above problems. He presents today to get the ears cleaned  Hypertension History:      He denies headache, chest pain, and palpitations.  Pt has been without his BP meds for 1 day. He is going today and get refills from the pharmacy.        Positive major cardiovascular risk factors include male age 3 years old or older, hyperlipidemia, and hypertension.  Negative major cardiovascular risk factors include no history of diabetes and non-tobacco-user status.        Positive history for target organ damage include ASHD (either angina/prior MI/prior CABG).  Further assessment for target organ damage reveals no history of stroke/TIA or peripheral vascular disease.     Allergies (verified): 1)  ! Sulfa  Review of Systems ENT:  Complains of decreased hearing; denies earache. CV:  Denies chest pain or discomfort. Resp:  Denies cough. GI:  Denies abdominal pain, nausea, and vomiting.  Physical Exam  General:  alert.   Head:   normocephalic.   Ears:  bil ear - unable to visualize TM  left - cotton swab removed from ear - able to see TM following removal right ear - maximum cerumen impaction - irrigation caused canal to bleed due to trauma - procedure ended Lungs:  normal breath sounds.   Heart:  normal rate and regular rhythm.   Msk:  up to the exam table Neurologic:  alert & oriented X3.   Psych:  Oriented X3.     Impression & Recommendations:  Problem # 1:  CERUMEN IMPACTION, RIGHT (ICD-380.4) trauma during procedure causing ear to bleed pt to use ear drops for 3 days then return to clinic for irrigation left ear - cotton ball removed  Orders: Cerumen Impaction Removal (62376)  Problem # 2:  HYPERTENSION (ICD-401.9) pt advised to go get his medications today and restart His updated medication list for this problem includes:    Lisinopril 20 Mg Tabs (Lisinopril) ..... One tablet by mouth for blood pressure  Complete Medication List: 1)  Lipitor 10 Mg Tabs (Atorvastatin calcium) .... One tablet by mouth daily for choleserol 2)  Lisinopril 20 Mg Tabs (Lisinopril) .... One tablet by mouth for blood pressure 3)  Antipyrine-benzocaine 54-14 Mg/ml Soln (Benzocaine-antipyrine) .... Two drops in right ear three  times a day for 3 days  Hypertension Assessment/Plan:      The patient's hypertensive risk group is category C: Target organ damage and/or diabetes.  Today's blood pressure is 158/105.  His blood pressure goal is < 140/90.  Patient Instructions: 1)  Remember do not use q-tips or any foreign objects in your ear. 2)  Be sure to get your blood pressure medications filled because your blood pressure is very high today. 3)  Use ear drops three times per day in right ear 4)  Follow up on Friday for Nurse visit - ear irrigation Prescriptions: ANTIPYRINE-BENZOCAINE 54-14 MG/ML SOLN (BENZOCAINE-ANTIPYRINE) Two drops in RIGHT ear three times a day for 3 days  #26ml x 0   Entered and Authorized by:   Lehman Prom FNP   Signed by:   Lehman Prom FNP on 08/19/2009   Method used:   Print then Give to Patient   RxID:   [id number]

## 2010-04-15 NOTE — Progress Notes (Signed)
Summary: needs f/u   Phone Note Outgoing Call   Summary of Call: [name] [name] should have f/u for HTN. I have never seen him but listed as mine. [name] [name] has seen x 2. Schedule f/u with either me or her. Initial call taken by: Brynda Rim,  November 07, 2009 3:23 PM  Follow-up for Phone Call        [facility] AND LEFT MESSAGE FOR [name] [name] TO CALL AND MAKE APPT. WITH [facility] OR [facility]. Follow-up by: Leodis Rains,  November 09, 2009 4:13 PM  Additional Follow-up for Phone Call Additional follow up Details #1::        Pt called back, but not sure when he can come in.  Has eligibilty appt on 12/01/09 and will call for ov. Additional Follow-up by: Vesta Mixer CMA,  November 12, 2009 3:42 PM

## 2010-04-15 NOTE — Letter (Signed)
Summary: [facility]  [facility]   Imported By: Arta Bruce 10/06/2009 14:47:35  _____________________________________________________________________  External Attachment:    Type:   Image     Comment:   External Document

## 2010-04-16 NOTE — Letter (Signed)
Summary: [facility]ESS [facility]  [facility]   Imported By: Arta Bruce 11/09/2009 14:48:24  _____________________________________________________________________  External Attachment:    Type:   Image     Comment:   External Document

## 2010-05-03 ENCOUNTER — Encounter (INDEPENDENT_AMBULATORY_CARE_PROVIDER_SITE_OTHER): Payer: Self-pay | Admitting: Nurse Practitioner

## 2010-05-11 NOTE — Letter (Signed)
Summary: MAILED REQUESTED RECORDS TO [facility]  MAILED REQUESTED RECORDS TO [facility]   Imported By: Arta Bruce 05/03/2010 09:25:05  _____________________________________________________________________  External Attachment:    Type:   Image     Comment:   External Document

## 2010-05-28 LAB — DIFFERENTIAL
Basophils Absolute: 0 10*3/uL (ref 0.0–0.1)
Eosinophils Relative: 3 % (ref 0–5)
Lymphocytes Relative: 44 % (ref 12–46)
Lymphs Abs: 3.5 10*3/uL (ref 0.7–4.0)
Monocytes Absolute: 0.7 10*3/uL (ref 0.1–1.0)

## 2010-05-28 LAB — URINE CULTURE

## 2010-05-28 LAB — URINALYSIS, ROUTINE W REFLEX MICROSCOPIC
Glucose, UA: NEGATIVE mg/dL
Ketones, ur: 15 mg/dL — AB
pH: 6 (ref 5.0–8.0)

## 2010-05-28 LAB — CBC
HCT: 44.4 % (ref 39.0–52.0)
MCH: 33.7 pg (ref 26.0–34.0)
MCV: 97.2 fL (ref 78.0–100.0)
RDW: 13.9 % (ref 11.5–15.5)
WBC: 8 10*3/uL (ref 4.0–10.5)

## 2010-05-28 LAB — BASIC METABOLIC PANEL
BUN: 6 mg/dL (ref 6–23)
CO2: 26 mEq/L (ref 19–32)
Chloride: 107 mEq/L (ref 96–112)
Creatinine, Ser: 1.38 mg/dL (ref 0.4–1.5)
Glucose, Bld: 111 mg/dL — ABNORMAL HIGH (ref 70–99)

## 2010-05-28 LAB — URINE MICROSCOPIC-ADD ON

## 2010-07-27 NOTE — Op Note (Signed)
NAME:  [name] [name], [name] [name]             ACCOUNT NO.:  [id number]   MEDICAL RECORD NO.:  [id number]          PATIENT TYPE:  AMB   LOCATION:  ENDO                         FACILITY:  [facility]   PHYSICIAN:  Petra Kuba, M.D.    DATE OF BIRTH:  1950/08/10   DATE OF PROCEDURE:  11/01/2007  DATE OF DISCHARGE:                               OPERATIVE REPORT   PROCEDURE:  Colonoscopy with polypectomy.   INDICATIONS:  The patient with history of colon cancer due for colonic  screening.  Consent was signed after risks, benefits, methods, options  thoroughly discussed multiple times in the past.   MEDICINES USED:  Fentanyl 70 mcg, Versed 6 mg.   PROCEDURE:  Rectal inspection is pertinent for small external  hemorrhoids.  Digital exam was negative.  The video colonoscope was  inserted and easily advanced around the colon to the cecum.  No  abnormalities were seen on insertion.  The cecum was identified by the  appendiceal orifice and the ileocecal valve.  The scope was slowly  withdrawn.  In the cecal pole, a tiny polyp was seen, it was cold  biopsied x2.  The scope was further withdrawn.  The ascending was  normal.  In the midtransverse, a tiny and small polyp was seen, and hot  biopsied x1 and put in the same container.  No other abnormalities were  seen until we through to the mid distal sigmoid where the anastomosis  was seen.  It was a colon-end-to-colon-side anastomosis, widely patent.  No other abnormalities.  The scope was slowly withdrawn back to the  rectum.  Anorectal pull through and retroflexion confirmed some small  hemorrhoids.  Scope was drained, readvanced towards the left side of the  colon.  Air was suctioned and scope was removed.  The patient tolerated  the procedure fairly adequately.  There was no obvious immediate  complications.   ENDOSCOPIC DIAGNOSES:  1. Internal and external small hemorrhoids.  2. Sigmoid anastomosis.  3. Small transverse polyp, hot biopsied.  4. Tiny  cecal polyp, cold biopsied.  5. Otherwise, within normal limits to the cecum.   PLAN:  Follow up p.r.n..  Await pathology.  Repeat screening probably 3-  5 years and return care to Dr. Barbaraann Barthel and Dr. Arline Asp.           ______________________________  Petra Kuba, M.D.     MEM/MEDQ  D:  11/01/2007  T:  11/02/2007  Job:  [id number]   cc:   Benetta Spar R. Rankins, M.D.  Samul Dada, M.D.

## 2010-07-30 NOTE — Cardiovascular Report (Signed)
[place]  Patient:    [name] [name], [name] [name] Visit Number: [id number] MRN: [id number]          Service Type: CAT Location: 6500 6524 02 Attending Physician:  Robynn Pane Dictated by:   Eduardo Osier Sharyn Lull, M.D. Proc. Date: 03/29/01 Admit Date:  03/29/2001 Discharge Date: 03/30/2001   CC:         Cardiac Catheterization Lab   Cardiac Catheterization  PROCEDURES: 1. Left cardiac catheterization with selective left and right coronary    angiography, left ventricular angiography via right groin using Judkins    technique. 2. Successful percutaneous transluminal coronary angioplasty to proximal left    anterior descending artery using 3.0- x 10-mm long CrossSail balloon. 3. Successful deployment of 3.5- x 13-mm long Zeta Multilink stent in proximal    left anterior descending artery.  INDICATION FOR PROCEDURE:  [name] [name] is a 60 year old black male with a past medical history significant for CA of the colon status post resection and chemotherapy, history of tobacco abuse, complains of recurrent retrosternal chest burning associated with diaphoresis related to exertion relieved with rest.  Denies any nausea, vomiting, or diaphoresis.  Denies shortness of breath, palpitations, lightheadedness, or syncope.  No history of PND, orthopnea, or leg swelling.  Patient underwent stress Cardiolite on March 16, 2001.  He exercised for 8.31 minutes on Bruce protocol.  Complained of retrosternal chest burning at peak of exercise.  Peak heart rate achieved was 124 which was 72% of maximum predicted heart rate and peak blood pressure was 190/95.  EKG showed minor ST depression in inferolateral leads. Cardiolite scan showed reversible ischemia and septal and anteroseptal and apical wall with ejection fraction of 49%.  Due to typical anginal pain, positive stress Cardiolite, the patient was advised for left catheterization and possible  PCI.  DESCRIPTION OF PROCEDURE:  After obtaining the informed consent, the patient was brought to the catheterization lab and was placed on the fluoroscopy table.  The right groin was prepped and draped in the usual fashion. Xylocaine, 2%, was used for local anesthesia in the right groin.  With the help of a thin-walled needle, a #6 French arterial sheath was placed.  The sheath was aspirated and flushed.  Next, a #6 French left Judkins catheter was advanced over the wire under fluoroscopic guidance to the ascending aorta. The wire was pulled out.  The catheter was aspirated and connected to the manifold.  The catheter was further advanced and engaged into the left coronary ostium.  Multiple views of the left system were taken.  Next, the catheter was disengaged and was pulled out over the wire and was replaced with a #6 [place] right Judkins catheter which was advanced over the wire under fluoroscopic guidance up to the ascending aorta.  The wire was pulled out. The catheter was aspirated and connected to the manifold.  The catheter was further advanced and engaged into the right coronary ostium.  Multiple views of the right system were taken.  Next, the catheter was disengaged and was pulled out over the wire and was replaced with #6 French pigtail catheter which was advanced over the wire under fluoroscopic guidance up to the ascending aorta.  The wire was pulled out.  The catheter was aspirated and connected to the manifold.  The catheter was further advanced across the aortic valve into the LV.  LV pressures were recorded.  Next, left ventricular angiography was done in 30-degree RAO position.  Post angiographic pressures were  recorded from the LV and then pullback pressures were recorded from the aorta.  There was no gradient across the aortic valve.  Next, the pigtail catheter was pulled out over the wire.  Sheaths were aspirated and flushed.  FINDINGS: 1. LV showed good LV  systolic function, EF of 50-55%. 2. The left main was patent. 3. The LAD has 30% ostial stenosis and 70% focal eccentric proximal stenosis. 4. The left circumflex was patent and tapers down after OM-2 and AV groove.    OM-1 was large which bifurcates into superior and inferior branch which was    patent.  OM-2 was medium sized which was patent. 5. The RCA has minimal proximal plaquing.  INTERVENTIONAL PROCEDURE:  Successful PTCA to proximal LAD was done using 3.0- x 10-mm long CrossSail balloon for predilatation and then 3.5- x 13-mm long Zeta Multi stent was deployed at eight atmospheres of pressure which was fully expanded going up to 10 atmospheres of pressure.  The lesion was dilated from 70% to 0% residual with excellent TIMI grade 3 distal flow without evidence of dissection or distal embolization.  The patient received weight-based heparin, ReoPro, and 300 mg of Plavix during the procedure.  The patient tolerated the procedure well.  There were no complications.  The patient was transferred to recovery room in stable condition. Dictated by:   Eduardo Osier Sharyn Lull, M.D. Attending Physician:  Robynn Pane DD:  03/30/01 TD:  03/30/01 Job: [id number]

## 2010-07-30 NOTE — Discharge Summary (Signed)
[place]. [facility]  Patient:    [name] [name], [name] [name]                      MRN: [id number] Adm. Date:  [id number] Disc. Date: [id number] Attending:  Carmin Muskrat CC:         [name] Celeste. Lurene [name], M.D.   Discharge Summary  DATE OF BIRTH:  10/16/1950  DISCHARGE DIAGNOSES: 1. Port-A-Cath related venous thrombosis involving the internal jugular,    subclavian, axillary, and brachial veins to the mid arm. 2. Carcinoma of the colon, T3, N0, stage II, with preoperative CEA of 6.2. 3. Adjuvant chemotherapy with 5-FU and leucovorin. 4. Addiction to cigarette smoking.  PROCEDURES: 1. Doppler study of the upper extremity. 2. CT scan of the head with and without intravenous contrast on September 30, 2000. 3. CT scan of the chest and neck on September 30, 2000. 4. Port-A-Cath injection under fluoroscopy, right upper extremity venography,    and thrombolytic infusion of Retavase on October 02, 2000. 5. On October 03, 2000, right Port-A-Cath injection and repositioning of    Port-A-Cath tip from the right internal jugular vein into the superior vena    cava. 6. On October 06, 2000, administration of chemotherapy consisting of two-hour    infusion of leucovorin 1050 mg followed by 5-fluorouracil 1050 mg during    the second hour of the leucovorin infusion.  This treatment represented #3    of cycle #2.  HISTORY OF PRESENT ILLNESS:  [name] [name] is a 60 year old, black, married male who was admitted to [facility] by the on-call physician, [name] [name]. [name] [name], M.D., because of a one-week history of some swelling and tenderness involving the right neck and arm.  The patient had a Port-A-Cath placed on the right side by [name] [name], M.D., on Jul 21, 2000, for adjuvant chemotherapy treatments involving his stage II carcinoma of the left colon.  These treatments had begun on in early May.  The patient had last received treatment on September 15, 2000, but had missed  treatment that had been scheduled for the previous week, specifically on September 22, 2000.  He was also due for a treatment on the day of admission, i.e. September 29, 2000.  The patient was evaluated with Doppler and found to have rather extensive thrombosis of the internal jugular, subclavian, axillary, and brachial veins down to the mid upper arm.  Several dilated superficial veins were noted with very high flow patterns.  HOSPITAL COURSE:  The patient was started on heparin anticoagulation that was monitored by the pharmacy.  In addition, he was started on 5 mg of Coumadin on October 01, 2000.  Coumadin was continued daily thereafter.  The patient was evaluated by [name] [name], M.D., from radiology, who felt that the patient would benefit from attempt at thrombolysis using Retavase.  I should mention that the patient was evaluated with CT scans of the brain, chest, and neck.  These studies confirmed the thrombosis associated with the venous access device.  In addition, there was no pathology noted involving the brain nor any evidence of metastatic disease involving the lungs.  There was felt to be a small amount of mural thrombus in the SVC.  A pulmonary embolus protocol was not utilized.  On October 02, 2000, the patient underwent studies as noted above and began an infusion with Retavase at a flow rate of 0.6 units/hr via the Port-A-Cath.  On the following day, October 03, 2000, the patient was reevaluated.  There was felt to be improvement in the internal jugular vein thrombosis after the infusion with some residual clot above the Port-A-Cath tip.  The tip of the catheter, which had flipped into the internal jugular vein was repositioned so that the tip was now at the cavoatrial junction. Clinically, the patient improved, responding to these therapeutic interventions.  He felt quite well during his time in the hospital and was quite active on the unit.  We continued his heparin and in addition  continued with the Coumadin.  On October 05, 2000, after four days of Coumadin at 5 mg/day, the pro time was only 14.4 seconds with an INR of 1.2.  On October 05, 2000, [name] received 10 mg of Coumadin.  The following day, his pro time was still low at 16.1 seconds with an INR of 1.4.  He received 12 mg on October 06, 2000, this being his sixth dose.  On October 07, 2000, his pro time was 19.0 with an INR of 1.9.  The platelet count was normal.  The patient did receive chemotherapy on October 06, 2000, 1050 mg of 5-FU, and 1050 mg of leucovorin, the former given as one-hour infusion during the last hour of the two-hour leucovorin infusion.  The patient tolerated this extremely well.  He had no problems during his hospitalization, which was fairly uneventful.  He did wear a nicotine patch 21 mg/day, which was quite helpful for his cigarette cravings.  On the morning of discharge, October 07, 2000, he was anxious to go home.  He will be discharged on the following medicine:  Coumadin 7.5 mg to take today, Saturday, October 07, 2000, and Sunday, October 08, 2000.  He was given discharge instructions in which he was supposed to call our office and have a pro time checked on Monday, October 09, 2000, and again on Thursday, October 12, 2000.  He understands to hold his Coumadin dose on those days that he has his pro time until the evening so that his pro time can be checked.  Clinically there was virtual complete resolution of the physical findings, which revealed a lot of swelling and tenderness in the right neck.  Exam was basically normal.  He had a functioning venous access device and his condition was markedly improved.  There were no restrictions on his activity or diet.  The patient will continue with his chemotherapy on Fridays and this will need to be scheduled again for October 13, 2000, for the fourth treatment of his second cycle.  It is anticipated that he will have six weeks of treatment followed by a  two-week break for a total of three or four cycles to comprise his adjuvant chemotherapy program.  The patient expressed interest in continuing with the  nicotine patches and attempt at cigarette cessation.  He will need to see me for a follow-up visit in approximately one month. DD:  10/07/00 TD:  10/09/00 Job: [id number]

## 2010-07-30 NOTE — Discharge Summary (Signed)
[place]. [facility]  Patient:    [name] [name], [name] [name]                      MRN: [id number] Adm. Date:  [id number] Disc. 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  PROCEDURE:  Left hemicolectomy with colorectal anastomosis.  COMPLICATIONS:  None.  CONDITION ON DISCHARGE:  Improved.  HISTORY OF PRESENT ILLNESS:  [name] is a 60 year old man presenting with severe cramping abdominal pain.  On colonoscopy, he was noted to have an almost completely obstructing carcinoma of the descending colon.  Prior to coming to the hospital, the patient developed a fever up to 103.  He also had leukocytosis of 24,000 suggesting that the tumor might have been perforated.  HOSPITAL COURSE:  He was brought to the operating room on the day of admission following completion of a bowel prep for colectomy.  He underwent colectomy for a nonperforated but penetrating carcinoma of the descending colon.  His postoperative course has been benign with normal resumption of diet and bowel activity.  At the time of discharge, his wounds are healing satisfactorily and he is tolerating a regular diet.  FOLLOWUP:  He is being discharged to be followed up in the office in two weeks.  He will be seen in followup by the oncology service.  DISCHARGE MEDICATION:  Percocet one to two every four hours p.r.n. pain.  ACTIVITY:  As tolerated.  DIET:  Unrestricted.  LABORATORY DATA AND X-RAY FINDINGS:  Final pathology report shows a moderately differentiated colonic carcinoma with extension into the mesenteric adipose tissue with 8/8 pericolonic nodes with no tumor identified.  TNM code, T3, N0, M0. DD:  08/10/00 TD:  08/10/00 Job: [id number]

## 2010-07-30 NOTE — H&P (Signed)
. [facility]  Patient:    [name] [name], [name] [name]                      MRN: [id number] Adm. Date:  [id number] Disc. Date: [id number] Attending:  Nelda Marseille CC:         Samul Dada, M.D.  Mardene Celeste Lurene Shadow, M.D.   History and Physical  CHIEF COMPLAINT: The patient is a 60 year old male, a patient of Dr. Nevada Crane, being transferred to Dr. Arline Asp, admitted with a catheter-associated clot of his right neck and right arm.  HISTORY OF PRESENT ILLNESS: Past medical history is positive for left hemicolectomy by Dr. Lurene Shadow on June 11, 2000, with pathology showing [id number] grade 2 adenocarcinoma with extension into perirectal fat.  Nine of nine pericolonic lymph nodes were negative.  Primary was 4.5 cm in size. Pathologically staged T3No lesion had negative margins.  Preoperative CEA was 6.2.  He had been a heavy drinker and smoker.  He went on to have on Jul 21, 2000 a Port-A-Cath placed on the right by Dr. Dominga Ferry.  The patient is a very poor historian.  By his history he has had a six weekly chemotherapy treatment with approximately 18 weeks more planned.  It sounds as though his compliance has been rather spotty.  He missed the last two weeks of therapy. He was scheduled to be established with Dr. Arline Asp, though had not seen him yet.  Since Monday he had developed right neck pain and swelling, which worsened time.  He was seen by his primary care physician Thursday for this and put on a muscle relaxant for a pulled neck.  The last time he had his catheter flushed two weeks ago he reported good blood return and chemotherapy infused easily through the catheter.  He denies any mucositis or diarrhea as complications.  He came to the emergency room after he had reported to me his neck swelling and I asked him to come to the [facility] Emergency Room. The ER physician ordered a venous Doppler study by the vascular  laboratory, which showed thrombosis of the right IJ, supraclavicular, axillary, and brachial vein to the mid upper arm, with several dilated superficial veins with high flow.  Subsequently he was admitted for anticoagulation.  PAST MEDICAL HISTORY: As above.  SOCIAL HISTORY: He has been a prior drinker, a six-pack per day, and also smokes one pack per day.  He is married and [place]  ALLERGIES: SULFA, rash.  FAMILY HISTORY: Not obtained.  REVIEW OF SYSTEMS: Basically as above.  He also, however, has had a little more dyspnea, he reports, than normal.  PHYSICAL EXAMINATION:  VITAL SIGNS: Blood pressure 122/80, temperature 98.4 degrees, pulse 85, respirations 24.  Oxygen saturation 95% on room air.  Of note, he has been on Coumadin 1 mg q.d. at home for prophylaxis.  HEENT: Oral mucosa looks normal.  I do not see mucosal bleeding.  NECK: On the right side the neck is swollen and tender all the way up and down the supraclavicular and cervical areas on the right.  The left side is normal. Axillary areas are normal bilaterally.  HEART: S1 greater than S2, without rubs or gallops.  LUNGS: Decreased breath sounds bilaterally.  ABDOMEN: No hepatosplenomegaly.  Bowel sounds active.  No inguinal adenopathy. Well-healed midline abdominal scar.  EXTREMITIES: Lower extremities really do not have any calf tenderness per se or swelling or increased heat.  The right arm is a little bit more swollen than the left but not a tremendous amount of difference.  LABORATORY DATA: PT, PTT baseline are pending.  WBC 10.2, hemoglobin 14, platelet count 177,000; absolute neutrophil count 5600.  Sodium 136, potassium 3.7, CO2 28, glucose 107, BUN 7, creatinine 1.2, alkaline phosphatase 116, SGOT 18, SGPT 14, albumin 3.4, calcium 8.9.  I should note that April 2002 looking back from the computer his post surgery CT scan of the abdomen and pelvis were  negative.  ASSESSMENT: Extensive catheter-associated clot secondary to right-sided Port-A-Cath.  It could also be at least partially secondary to hypercoagulability secondary to his adenocarcinoma.  The patient was on Coumadin 1 mg q.d. to try to prevent such clot.  PLAN: Will get CT imaging to rule out pulmonary embolus or extensive central clot.  He does have a little bit of facial swelling so it is possible he could be developing SVC syndrome.  Depending on that and how extensive things look we might need to consider thrombolysis.  However, I suspect this is at least partly subacute and we may not have a lot to gain by this.  Certainly the heparin drip, however, is reasonable and the pharmacy can manage that for [place] to start. DD:  09/30/00 TD:  10/01/00 Job: [id number]

## 2010-07-30 NOTE — Discharge Summary (Signed)
[place]. [facility]  Patient:    [name] [name], PEOPLE Visit Number: [id number] MRN: [id number]          Service Type: MED Location: 2000 2040 01 Attending Physician:  Robynn Pane Dictated by:   Eduardo Osier Sharyn Lull, M.D. Admit Date:  04/15/2001 Discharge Date: 04/17/2001                             Discharge Summary  ADMISSION DIAGNOSES: 1. New onset angina, rule out restenosis. 2. Coronary artery disease. 3. Status post percutaneous transluminal coronary angioplasty and stenting    to left anterior descending approximately two weeks ago. 4. Hypertension. 5. Tobacco abuse. 6. Hypercholesterolemia. 7. Positive family history of coronary artery disease. 8. History of carcinoma of colon.  DISCHARGE DIAGNOSES: 1. Stable angina. 2. Positive stress test Cardiolite. 3. Coronary artery disease. 4. Status post percutaneous transluminal coronary angioplasty and stenting    to proximal left anterior descending approximately two weeks ago. 5. Hypertension. 6. Hypercholesterolemia. 7. History of tobacco abuse. 8. Positive family history of coronary artery disease. 9. History of carcinoma of colon.  DISCHARGE MEDICATIONS: 1. Nitro-Dur 0.4 mg per hour apply to chest wall in a.m. and off at night. 2. Plavix 75 mg one tablet daily with food. 3. Enteric coated aspirin 325 mg one tablet daily. 4. Toprol XL 25 mg half tablet daily. 5. Nitrostat 0.4 mg sublingual use as directed. 6. Lipitor 10 mg one tablet daily.  ACTIVITY:  As tolerated.  DIET:  Low salt, low cholesterol diet.  FOLLOW-UP:  The patient will follow up with me in one week.  CONDITION ON DISCHARGE:  Stable.  BRIEF HISTORY:  [name] [name] is a 60 year old black male with past medical history significant for coronary artery disease, status post PTCA and stenting to proximal LAD approximately two weeks ago, hypertension, hypercholesterolemia, and tobacco abuse, history of CA of colon,  positive family history of coronary artery disease; who came to the ER via EMS complaining of recurrent retrosternal chest tightness grade 8/10 associated with diaphoresis and shortness of breath.  He took initially one sublingual nitroglycerin with relief and again when walking in the kitchen, had some chest pain requiring three sublingual nitroglycerin so he called EMS. The patient presently denies any chest pain, states he was feeling stronger and had more energy after PTCA and stenting.  Denies any palpitations, lightheadedness or syncope.  He denies PND, orthopnea, leg swelling.  PAST MEDICAL HISTORY:  As above.  PAST SURGICAL HISTORY:  He had partial colectomy in March of 2002, he had Port-A-Cath insertion subsequently.  SOCIAL HISTORY:  He is married and has one child.  He smoked a half pack per day for 10 years. He drinks beer occasionally.  He works as a Medical illustrator.  FAMILY HISTORY:  Positive for coronary artery disease.  MEDICATION AT HOME: 1. Plavix 75 mg p.o. q.d. 2. Enteric coated aspirin one p.o. q.d. 3. Toprol XL 25 mg half q.d. 4. Nitroglycerin 0.2 mg per hour q.d. 5. Lipitor 10 mg p.o. q.d. 6. Nitroglycerin sublingual p.r.n. 7. Coumadin 1 mg p.o. q.d.  PHYSICAL EXAMINATION:  GENERAL APPEARANCE:  He was awake, alert and oriented x3, in no acute distress.  VITAL SIGNS:  Blood pressure was 114/68, pulse 48, sinus brady on monitor.  HEENT:  Conjunctiva pink.  NECK:  Supple.  No JVD, no bruit.  LUNGS:  Clear to auscultation without rhonchi or rales.  CARDIOVASCULAR:  S1 and  S2 normal. There was no S3 gallop. Port-A-Cath site was okay.  ABDOMEN:  Soft, bowel sounds present, nontender.  EXTREMITIES:  There was no clubbing, cyanosis, or edema.  EKG showed sinus bradycardia with no acute ischemic changes.  His stress Cardiolite showed mild ischemia in anteroseptal and ___________ wall, EF 48% which was similar as before approximately three weeks ago.  Two  sets of cardiac enzymes were negative.  Two sets of troponin I were negative.  His hemoglobin was 13.5, hematocrit 38.8, white count 5.4.  Sodium 142, potassium 3.7, chloride 110, bicarb 27, glucose 108, BUN 13, creatinine 1.1.  BRIEF HOSPITAL COURSE:  The patient was admitted to telemetry unit.  MI was ruled out by serial enzymes and EKG. The patient did not have any episodes of chest pain during the hospital stay.  The patient underwent stress Cardiolite on April 16, 2001; exercised for eight minutes on Bruce protocol.  He compl,ained of retrosternal chest tightness at the peak of exercise which resolved immediately after stopping.  Peak heart rate achieved was 127.  Peak blood pressure achieved was 160/84.  EKG showed no acute significant ischemic changes.  Cardiolite scan showed small area of mild ischemia in the anteroseptal and apical wall as before.  Discussed with the patient at length regarding the Cardiolite finding and various options of treatment; i.e., maximizing the medication for now and if gets recurrent chest pain, proceed with cath and possible PCI versus the left cath, possible PCI and stress __________.  The patient initially wanted to discuss with his wife and agreed to proceed with medical management for now and if he gets recurrent chest pain, will contact me immediately and consider PCI. Dictated by:   Eduardo Osier Sharyn Lull, M.D. Attending Physician:  Robynn Pane DD:  04/17/01 TD:  04/18/01 Job: [id number]

## 2010-07-30 NOTE — Discharge Summary (Signed)
[name]  Patient:    [name] [name], [name] [name] Visit Number: [id number] MRN: [id number]          Service Type: CAT Location: [id number] Attending Physician:  Robynn Pane Dictated by:   Eduardo Osier Sharyn Lull, M.D. Admit Date:  03/29/2001 Discharge Date: 03/30/2001                             Discharge Summary  ADMISSION DIAGNOSES: 1. New onset angina. 2. Positive stress Cardiolite. 3. Tobacco abuse. 4. History of carcinoma of colon.  FINAL DIAGNOSES: 1. New onset angina. 2. Positive stress Cardiolite, status post percutaneous transluminal coronary    angioplasty and stenting to proximal left anterior descending. 3. Acute bronchitis. 4. History of tobacco abuse. 5. History of carcinoma of colon.  DISCHARGE MEDICATIONS: 1. Plavix 75 mg one tablet daily with food 2. Enteric coated aspirin 325 mg one q.d. 3. Toprol XL 25 mg one-half tablet daily. 4. Nitro-Dur 0.2 mg per hour apply to chest wall in a.m. and off in p.m. 5. Nitrostat 0.4 mg sublingual use as directed. 6. Zithromax 500 mg one tablet daily for two more days.  ACTIVITY:  Avoid heavy lifting, pushing or pulling for 48 hours.  DIET:  Low salt, low cholesterol.  DISCHARGE INSTRUCTIONS:  Post angioplasty and stent instructions have been given.  The patient has been given information regarding phase 2 cardiac rehabilitation.  FOLLOW UP:  Patient will follow up with me in one week.  CONDITION ON DISCHARGE:  Stable.  BRIEF HISTORY AND HOSPITAL COURSE:  The patient is a 60 year old black male with past medical history significant for carcinoma of colon, status post resection and chemotherapy, history of tobacco abuse, complains of recurrent retrosternal chest pain, chest burning, associated with diaphoresis related to exertion and relieved with rest.  The patient denies any nausea, vomiting, denies shortness of breath, palpitations, light headedness or syncope.  The patient denies  paroxysmal nocturnal dyspnea, orthopnea, leg swelling.  Patient underwent a stress Cardiolite on 03/16/01 exercised for 8.31 minutes on Bruce Protocol.  Patient complained of retrosternal chest burning at peak of exercise.  Peak heart rate achieved was 124 which was 72% of maximum predicted heart rate and peak blood pressure was 190/95.  Electrocardiogram showed minor ST-depression in the inferior lateral leads and Cardiolite scan showed reversible ischemia, septal, anteroseptal and apical wall with ejection fraction of 49%.  Due to typical anginal pain and positive stress Cardiolite the patient was advised for possible percutaneous coronary intervention.  PAST MEDICAL HISTORY:  As above.  PAST SURGICAL HISTORY: Patient has had partial colectomy in March 2002.  He had circumcision many years, history of arthroscopic right knee surgery.  ALLERGIES:  SULFA.  SOCIAL HISTORY:  Patient is married and has one child.  He smoked one-half pack per day for 10 years and now smokes 2 to 3 cigarettes per day.  Patient used to drink beer socially, worked as Medical illustrator in IT sales professional.  FAMILY HISTORY:  Positive for coronary artery disease.  MEDICATIONS AT HOME:  Patient is on enteric coated aspirin 325 mg p.o. q.d., Toprol XL 25 mg one-half tablet daily, Nitrostat sublingual p.r.n.  PHYSICAL EXAMINATION:  Patient was alert and oriented times three in no acute distress.  VITAL SIGNS:  Blood pressure 120/70, pulse 70 regular.  HEENT:  Conjunctivae pink.  NECK:  Supple with no jugular venous distension or bruits.  LUNGS:  Clear to auscultation without rhonchi  or rales.  CARDIOVASCULAR:  S1, S2 normal.  There was no S3, gallop, rub or murmur.  ABDOMEN:  Soft.  Bowel sounds present, nontender.  EXTREMITIES:  No clubbing, cyanosis, or edema.  BRIEF HOSPITAL COURSE:  The patient was admitted and underwent left cardiac catheterization with left and right coronary angiography and  percutaneous transluminal coronary angioplasty with stenting to proximal left anterior descending as per procedure report.  The patient tolerated the procedure well. There were no complications.  The patient was transferred to elective angioplasty unit.  The sheaths were pulled out on the same day.  There was no evidence of hematoma or bruit.  There were no further episodes of chest pain during the hospitalization.  The patient has been ambulating in the hallway without any problem.  The patient had last night cough with some sore throat and was started on Zithromax with improvement in his symptoms.  Post procedure CPKs total CPK elevated at 771 but MB total is 2.5 with index of 0.3. His platelet count is stable.  Hemoglobin and hematocrit have been also stable post procedure. His renal function is stable.  Electrocardiogram post procedure showed no evidence of ischemia.  Electrocardiogram earlier this morning showed some T wave inversion in the lateral leads.  A repeat electrocardiogram done showed no evidence of ischemic changes.  The patient did not have any episodes of chest pain during his hospital stay.  The patient will be discharged home on the above medications and will be followed up in my office in one week.  The patient has been given instructions regarding phase 2 cardiac rehabilitation and will discussed with the patient further next week. Dictated by:   Eduardo Osier Sharyn Lull, M.D. Attending Physician:  Robynn Pane DD:  03/30/01 TD:  04/02/01 Job: [id number]

## 2010-07-30 NOTE — Discharge Summary (Signed)
NAME:  [name] [name], [name] [name]             ACCOUNT NO.:  [id number]   MEDICAL RECORD NO.:  [id number]          PATIENT TYPE:  INP   LOCATION:  3705                         FACILITY:  [facility]   PHYSICIAN:  Mohan N. Sharyn Lull, M.D. DATE OF BIRTH:  May 17, 1950   DATE OF ADMISSION:  11/26/2005  DATE OF DISCHARGE:  11/29/2005                                 DISCHARGE SUMMARY   ADMITTING DIAGNOSIS:  Recurrent chest pain.  Rule out coronary  insufficiency, hypertension, hypercholesteremia controlled by diet, history  of hepatitis C, history of CA of colon, tobacco abuse, coronary artery  disease, status post PCI to LAD in the past.   FINAL DIAGNOSIS:  1. Status post recurrent chest pain, myocardial infarction ruled out.  2. Negative Persantine Myoview.  3. Ischemic cardiomyopathy.  4. Coronary artery disease.  5. Status post percutaneous coronary intervention to labor and delivery in      the past.  6. Hypertension.  7. Hypercholesteremia.  8. History of hepatitis C.  9. History of carcinoma of the colon.  10.Tobacco abuse.  11.Left renal nonobstructive cholelithiasis.   DISCHARGE HOME MEDICATIONS:  1. Enteric-coated aspirin 81 mg 1 tablet daily.  2. Coreg CR 10 mg 1 capsule daily.  3. Altace 2.5 mg 1 capsule daily.  4. Darvocet-N 100, one to two tablets q.8 h. as needed for flank pain.  5. Protonix 40 mg 1 tablet daily.  6. Nitrostat sublingual 0.4 mg use as directed.   DIET:  Low salt, low cholesterol.   FOLLOWUP:  With me in 1 week and urology service in 1 week.   CONDITION AT DISCHARGE:  Stable.   BRIEF HISTORY AND HOSPITAL COURSE:  [name] [name] is a 60 year old black male  with past medical history significant for coronary artery disease, status  post PTCA stenting to LAD.  In the past, hypertension, hypercholesteremia,  CA of colon.  Patient came to the ER complaining of recurrent retrosternal  chest pain grade 10/10 off-and-on for last 2-3 weeks relieved partially with  sublingual  nitro.  Denies any nausea, vomiting or diaphoresis.  Denies  palpitation, lightheadedness, or syncope.  Denies shortness of breath.  Denies PND, orthopnea, or leg swelling.  Denies exertional chest pain.  States that occasionally chest pain radiates to left shoulder, left arm, and  occasionally gets relieved with movement.  Denies relation of chest pain to  food, breathing, denies cough, fever or chills.  Also complains of left  flank pain, and denies any urinary complaints,   PAST MEDICAL HISTORY:  As above, also history of hepatitis C.   PAST SURGICAL HISTORY:  1. He had partial colectomy in March 2002 for CA of the colon  2. Circumcision many years ago.  3. Knee surgery in the past.  4. Colonoscopy and polypectomy in January of 2006.   SOCIAL HISTORY:  He is married, has one child.  Smoked half-a-pack per day  for 10 years.  Used to drink beer socially.  Presently unemployed.   FAMILY HISTORY:  Positive for coronary artery disease.   MEDICATION AT HOME:  He takes aspirin and nitro as needed.   PHYSICAL  EXAMINATION:  GENERAL:  He is alert and oriented x3 in no acute  distress.  VITAL SIGNS:  Blood pressure 144/88, pulse was 98.  HEENT:  Conjunctivae were pink.  NECK:  Supple, no JVD, no bruit.  LUNGS:  Clear to auscultation without rhonchi or rales.  CARDIOVASCULAR EXAM:  S1-S2 was normal.  There was soft S4 gallop.  ABDOMEN:  Soft.  Bowel sounds were present, nontender.  There was left CVA  mild tenderness.  EXTREMITIES:  There was no clubbing, cyanosis or edema.   LABS:  EKG showed sinus bradycardia with early repolarization.  There were  no acute ischemic changes noted.  Three sets of point of care cardiac  enzymes, CPK/MB, and troponin I were negative.  Cholesterol 131, LDL 70, HDL  was 51.  Three sets of cardiac enzymes by lab:  CK 130, MB was 1.5;  second  set CK 171, MB was 2.2; third set CK 98, MB was 1.4.  Troponin I were 0.03,  0.02 and 0.03.  His sodium 137,  potassium 3.1, repeat potassium was 4.1.  Bicarb 30, glucose 94, BUN 8, creatinine 1.3.  His AST and ALT were slightly  elevated at 67 and 56.  Total bilirubin 1.2, lipase was 28.  His hemoglobin  15.8, hematocrit 46.8, white count was 8.9 with no shift to the left.  He  had CTA of the abdomen which showed nonobstructive left renal calculus small  low-density exophytic structure associated with right kidney suggestive of  cysts, few prominent gastrohepatic lymph nodes.  CT of the pelvis showed no  acute findings in the pelvis, slight enlarged pelvic lymph nodes.  Persantine Myoview showed no evidence of reversible ischemia with EF of 37%.   BRIEF HOSPITAL COURSE:  The patient was admitted to telemetry unit.  MI was  ruled out by serial enzymes and EKG.  The patient subsequently underwent  Persantine Myoview on 09/17 which showed no evidence of reversible ischemia  with EF of 37% which has significantly reduced from prior EF of 46%.  The  patient has stopped all his blood pressure medication; and is noncompliant  to medications.  The patient will be restarted on beta blockers and ACE  inhibitors.  Will do 2-D echo as outpatient to evaluate his LV function.  The patient had exacerbation of left flank pain requiring IV morphine.  Urology consultation was obtained; and the patient has outpatient  appointment to  see urologist.  The patient did not have any problems voiding urine; there  were no rbc's in the urinalysis; his kidney function has been stable.  The  patient will be discharged home on above medications; and will be followed  up in my office in 1 week.           ______________________________  Eduardo Osier. Sharyn Lull, M.D.     MNH/MEDQ  D:  11/29/2005  T:  11/30/2005  Job:  [id number]

## 2010-07-30 NOTE — Procedures (Signed)
[facility]  Patient:    [name] [name], [name] [name]                      MRN: [id number] Proc. Date: 05/25/00 Adm. Date:  [id number] Disc. Date: [id number] Attending:  Doug Sou CC:         Nancee Liter, M.D.   Procedure Report  PROCEDURE:  Colonoscopy with biopsy.  ENDOSCOPIST:  Petra Kuba, M.D.  INDICATIONS:  Bloody diarrhea.  CAT scan showing questionable colitis, want to confirm the diagnosis.  INFORMED CONSENT:  Consent was signed after risks, benefits, methods and options were thoroughly discussed in the office.  MEDICATIONS USED:  Demerol 75 mg, Versed 8 mg.  DESCRIPTION OF PROCEDURE:  Rectal inspection was pertinent for external hemorrhoids.  Digital exam was negative.  The video colonoscope was easily inserted and advanced to about 30-35 cm.  At that point, an obstructing mass lesion was seen.  It looked more like a cancer than a post colitis or diverticular stricture.  Multiple biopsies were obtained.  We went ahead and withdrew the scope after not being able to advance it through the small lumen. No other abnormalities were seen as we withdrew back to the rectum.  Once back in the rectum, the scope was retroflexed.  We went ahead and inserted an upper endoscope and with minimal difficulty we were able to advance it pass the strictured lesion to advance to the proximal level of the mid ascending colon. It did require rolling him on his back and some abdominal pressure.  We could not advance any further due to looping.  The scope was based on the light reflex on the right side and based on [place] traveling past what we thought was the transverse and making a turn at the hepatic flexure believe we were in the ascending but could not be sure.  The scope was slowly withdrawn.  There was a lot of spasm in the transverse and descending but no obvious other significant lesions were seen.  The scope was slowly withdrawn back to the mass lesion and a  few more biopsies were obtained at the proximal end.  The scope was further withdrawn.  Again, no additional findings were seen.  The air was suctioned and the scope removed.  The patient tolerated the procedure well.  There were no immediate complications.  ENDOSCOPIC DIAGNOSES: 1. Internal and external hemorrhoids. 2. New obstructing mass at about 30 cm in the proximal level of the    proximal level of the sigmoid, status post biopsy, unable to pass    the regular scope. 3. Able to pass the upper endoscope to approximately the mid    ascending without any additional significant lesions.  PLAN: 1. Go ahead and check labs, CBC, C-MET and CEA. 2. Await pathology but will probably need surgical options    Will discuss later today and will let them think about which    surgeon they would like.  We will set him up as soon as we get the    pathology report. DD:  05/25/00 TD:  05/25/00 Job: [id number]

## 2010-07-30 NOTE — Consult Note (Signed)
NAME:  [name] [name], [name] [name] NO.:  [id number]   MEDICAL RECORD NO.:  [id number]          PATIENT TYPE:  INP   LOCATION:  3705                         FACILITY:  [facility]   PHYSICIAN:  Bertram Millard. Dahlstedt, M.D.DATE OF BIRTH:  [birth year]   DATE OF CONSULTATION:  DATE OF DISCHARGE:                                   CONSULTATION   UROLOGY CONSULTATION   DATE OF CONSULTATION:  November 28, 2005.   REASON FOR CONSULTATION:  Left renal stone.   BRIEF HISTORY:  A 60 year old male with a prior history of coronary artery  disease - status post PTCA with stenting.  He presented to the emergency  room with substernal chest pain.  He has also had left flank pain for the  past week or so.  A CT scan of the abdomen was performed 2 days ago.  This  revealed a very tiny left renal calculus without obstruction.  He also had a  small right renal cyst, simple in nature, 5 mm in size.  Urologic  consultation was requested.   The patient denies urologic history besides the circumcision.  He is  currently disabled.  He has a history of hepatitis C.  He has had a partial  cholecystectomy in March of 2002.  He has had knee surgery.   He is a smoker.  He is married and has a child.   FAMILY HISTORY:  Positive for coronary artery disease.   MEDICATIONS:  At the time of admission, medications included only aspirin.   ALLERGIES:  HE IS ALLERGIC TO SULFA.   REVIEW OF SYSTEMS:  Pain gets better and worse by moving around.  He finds  it hard to move around in bed because of this.  He has had no change in  lower urinary tract symptoms.  He has no cough or sputum production.  He has  no gross hematuria, dysuria.   PHYSICAL EXAM:  Pleasant, middle aged male.  He was lying in bed without  discomfort.  He had minimal left flank tenderness.  No mass or abdominal  tenderness was noted.  No rebound or guarding.  GU and rectal exam were  deferred.   LABORATORY:  Urine culture on admission was no  growth.  His creatinine was  1.3.  Urinanalysis revealed few bacteria, mild ketonuria, mild proteinuria,  small bilirubin.   I reviewed the patient's CT scan myself.   IMPRESSION:  1. Left flank pain, non GU in etiology.  2. Right renal cyst, simple in nature, very small, not a worry.  3. Left renal calculus, small, nonobstructive, not associated with pain.   PLAN:  1. I talked with patient and his wife for 15 minutes regarding his left      renal stone - I let them know that this is not causing his pain as it      is the renal parenchyma and is not causing obstruction.  2. I reassured him regarding his right renal cyst.  3. I will have him come back in my office in the next week or two after      discharge  just to provide a full examination at this time.      Bertram Millard. Dahlstedt, M.D.  Electronically Signed     SMD/MEDQ  D:  11/28/2005  T:  11/28/2005  Job:  [id number]   cc:   Eduardo Osier. Sharyn Lull, M.D.

## 2010-07-30 NOTE — Op Note (Signed)
. [facility]  Patient:    [name] [name], [name] [name]                      MRN: [id number] Proc. Date: 08/03/00 Adm. Date:  [id number] Attending:  Nelda Marseille CC:         Nancee Liter, M.D.  Mardene Celeste Lurene Shadow, M.D.  Dolan Amen, M.D.   Operative Report  PROCEDURE:  Colonoscopy with polypectomy.  INDICATIONS:  The patient with a near obstructing colon cancer recently diagnosed who we were unable to get our colonoscope passed the lesion, but able to get the upper endoscope to the approximate level of the hepatic flexure, requesting complete colonoscopy by Dr. Nevada Crane.  Consent was signed after risks, benefits, methods, and options were thoroughly discussed with [name] in the past.  MEDICINES USED:  Demerol 50, Versed 5.  DESCRIPTION OF PROCEDURE:  Rectal inspection was pertinent for small external hemorrhoids.  Digital examination was negative.  Video pediatric colonoscope was inserted and easily advanced around the colon to the cecum.  No obvious abnormality was seen on insertion.  The cecum was identified by the appendiceal orifice and the ileocecal valve.  In fact the scope was inserted a short ways into the terminal ileum which was normal.  Photodocumentation was obtained.  The scope was slowly withdrawn.  The TI and cecum were normal.  In the mid ascending, a small 2 mm polyp was seen and was hot biopsied x 1. The scope was further withdrawn and in the proximal wall of the hepatic flexure, a 4 to 5 mm polyp was seen, snared, electrocautery applied, and the polyp was suctioned through the scope and collected in the trap.  The scope was slowly withdrawn.  In the proximal level of the descending, another small polyp was seen and was hot biopsied x 2.  The scope was withdrawn back to the anastomosis which was about 30 cm which had a very shallow ulceration along it, but no mass lesion or other abnormality.  The scope was further  withdrawn through the normal remaining distal sigmoid, back to the rectum and retroflexed, pertinent for some tiny internal hemorrhoids.  The scope was straightened, readvanced easily to the cecum.  Air was suctioned and scope removed.  The patient tolerated the procedure adequately.  There was no obvious immediate complications.  ENDOSCOPIC DIAGNOSES: 1. Small internal and external hemorrhoids. 2. Anastomosis at roughly 30 cm with a small shallow ulcer. 3. Three tiny to small polyps, two hot biopsied in the descending and    ascending with one snare in the proximal level of the hepatic flexure. 4. Otherwise within normal limits to the terminal ileum.  PLAN:  Await pathology, but probably recheck in two to three years.  Will see back sooner p.r.n.  Otherwise yearly rectals and guaiacs per above mentioned doctors and certainly if unexplained guaiac positivity, anemia, or other GI symptoms occurred, would be happy to proceed sooner. DD:  08/03/00 TD:  08/03/00 Job: [id number]

## 2010-07-30 NOTE — Op Note (Signed)
. [facility]  Patient:    [name] [name], [name] [name]                      MRN: [id number] Proc. Date: 06/01/00 Adm. Date:  [id number] Attending:  Sonda Primes                           Operative Report  PREOPERATIVE DIAGNOSIS:  Obstructing carcinoma of the descending colon.  POSTOPERATIVE DIAGNOSIS:  Obstructing carcinoma of the descending colon.  OPERATION PERFORMED:  A left hemicolectomy with take-down of the splenic flexure and colostomy.  SURGEON:  Mardene Celeste. Lurene Shadow, M.D.  ASSISTANT:  Marnee Spring. Wiliam Ke, M.D.  ANESTHESIA:  General.  INDICATIONS FOR PROCEDURE:  The patient is a 60 year old man presenting with severe cramping abdominal pain.  At colonoscopy he was noted to have an almost completely obstructing carcinoma of the descending colon.  Prior to coming into the hospital, the patient developed a fever up to 103.  He also had leukocytosis of 24,000 suggesting that the tumor might be perforating.  He is brought to the operating room now following completion of his bowel prep for colectomy.  DESCRIPTION OF PROCEDURE:  Following induction of satisfactory general anesthesia, the patient positioned supinely, the abdomen was prepped and draped to be included in a sterile operative field.  Exploratory laparotomy carried out through a midline incision and upon entering the abdomen there was no seeding or metastasis noted on the sidewalls.  The tumor was invading into the left lateral side wall.  The liver surfaces were soft and smooth without any gross evidence of metastases.  There were some nodules within the colon mesentery suggestive of metastatic disease to the mesenteric lymph nodes. Dissection was started near the sigmoid with reflection of the peritoneal reflection.  At the region of the tumor, I took a wide margin of the parietal peritoneum to get around the tumor and carried the dissection up along the retroperitoneal reflection up to  the splenic flexure.  The left ureter was positively identified and protected throughout the course of dissection.  The splenic flexure was taken down along with the gastrocolic omentum so as to get adequate margins on the tumor and to bring the transverse colon down into the pelvis.  The bowel was divided proximally and distally using GIA staples and the large amount of mesentery was taken to include the left colic vessels. These were secured with 0 silk ties.  The remainder of the mesentery was secured with 2-0 silk ties.  The entire specimen was removed and forwarded for pathologic evaluation.  Hemostasis was assured in the retroperitoneum and a functional end-to-end anastomosis was carried out with a GIA stapler and a TA-55 stapling device.  The resulting anastomosis was widely patent.  The mesenteric defect was closed with interrupted 3-0 silk sutures.  The wound was then thoroughly irrigated and again all the areas of dissection checked for hemostasis and noted to be dry.  Sponge, instrument and sharp counts were verified.  The wound was closed as follows:  The midline was closed with a running #1 suture of Novofil, the skin closed with staples.  A sterile dressing was applied to the wound.  Anesthetic reversed.  Patient removed from the operating room to the recovery room in stable condition having tolerated the procedure well. DD:  06/01/00 TD:  06/02/00 Job: [id number]

## 2010-07-30 NOTE — Cardiovascular Report (Signed)
NAME:  [name] [name], [name] [name]                       ACCOUNT NO.:  [id number]   MEDICAL RECORD NO.:  [id number]                   PATIENT TYPE:  OIB   LOCATION:  2899                                 FACILITY:  [facility]   PHYSICIAN:  Robynn Pane, M.D.               DATE OF BIRTH:  11/13/1950   DATE OF PROCEDURE:  01/08/2002  DATE OF DISCHARGE:                              CARDIAC CATHETERIZATION   PROCEDURE:  Left cardiac catheterization with selective left and right  coronary angiography, left ventriculography via right groin using Judkins  technique.   INDICATION FOR THE PROCEDURE:  The patient is a 60 year old black male with  a past medical history significant for coronary artery disease, status post  PTCA and stenting to proximal LAD, 3.5/13 mm long Multi-Link stent in  January of 2003, history of hypercholesterolemia, hypertension, tobacco  abuse, CA of colon complains of retrosternal chest pain radiating to the  inner side of the left arm relieved with sublingual nitroglycerin.  The  patient denies any nausea, vomiting, diaphoresis.  Denies PND, orthopnea,  leg swelling.  Denies palpitations, lightheadedness or syncope.  The patient  denies any rest or nocturnal angina. The patient had stress Cardiolite in  February of 2003 when he was admitted with similar chest pain which showed  mild ischemia in the anteroseptal and anteroapical wall and at that time  opted for medical management, but due to recurrent typical anginal pain  patient consented for PCI.   PAST MEDICAL HISTORY:  As above.   PAST SURGICAL HISTORY:  Had partial colectomy in March of 2002.  Had  circumcision years ago.  Had knee surgery many years ago.   ALLERGIES:  He is allergic to SULFA.   SOCIAL HISTORY:  He is married and has one child.  Smoked half pack-per-day  for 10+ years.  Used to drink beer socially.  He works as Medical illustrator.   FAMILY HISTORY:  Mother is alive.  She is 25, has coronary artery disease.  He does not know about his father.  He has four brother's and two sister's  in good health.   HOME MEDICATIONS:  1. He is on Toprol-XL 25 mg half p.o. daily.  2. Plavix 75 mg p.o. daily  3. Enteric-coated aspirin 1 p.o. daily  4. Lipitor 10 mg p.o. daily.   PHYSICAL EXAMINATION:  GENERAL:  On examination, he is alert, awake,  oriented x 3 in no acute distress.  VITAL SIGNS:  Blood pressure was 114/76, pulse was 56, regular.  HEENT:  Conjunctivae was pink.  NECK:  Supple, no JVD, no bruit.  LUNGS:  Clear to auscultation without rhonchi or rales.  HEART: Cardiovascular examination:  S1 and S2 was normal. There was no S3  gallop.  EXTREMITY:  There is no clubbing, cyanosis or edema.   IMPRESSION:  New onset accelerated angina, rule out re-stenosis, coronary  artery disease, history of percutaneous transluminal coronary angioplasty  and stenting to left anterior descending, positive stress Cardiolite in the  past, hypertension, hypocontractility, history of tobacco abuse, history of  cancer of the colon.   Discussed with patient length regarding left catheterization, possible  percutaneous transluminal coronary angioplasty and stenting, its risks,  i.e., death, MI, stroke, or need for emergency CABG, risks of restenosis,  local vascular complications, etc., and consent for PCI.   DESCRIPTION OF PROCEDURE:  After obtaining the informed consent, the patient  was brought to the catheterization lab and was placed on the fluoroscopy  table.  The right groin was prepped and draped in the usual fashion. Then 2%  Xylocaine was used for local anesthesia in the right groin. With the help of  thin wall needle, a 6 French  arterial sheath was placed. The sheath was  aspirated and flushed. Next, a 6 French left Judkins catheter was advanced  over the wire under fluoroscopic guidance up to the ascending aorta. The  wire was pulled out, the catheter was aspirated and connected to the  manifold.  The catheter was further advanced and engaged into the left  coronary ostium.  Multiple views of the left system were taken. Next, the  catheter was disengaged over the wire and was replaced with a 6 French right  Judkins catheter, which was advanced over the wire under fluoroscopic  guidance up to the ascending aorta. The wire was pulled out, the catheter  was aspirated and connected to the manifold. The catheter was further  advanced and engaged into the right coronary ostium. Multiple views of the  right system were taken. Next, the catheter was disengaged and was pulled  out over the wire and was replaced with a 6 French pigtail catheter, which  was advanced over the wire under fluoroscopic guidance up to the ascending  aorta. The wire was pulled out, the catheter was aspirated and connected to  the manifold. The catheter was further advanced across the aortic valve into  the LV. LV pressures were recorded.  Next, LV gram was done in 30-degree RAO  position. Postangiographic pressures were recorded from LV and then pullback  pressures were recorded from the aorta. There was no gradient across the  aortic valve.  Next, the pigtail catheter was pulled out, sheaths were  aspirated and flushed.   FINDINGS:  LV was mildly enlarged, EF of 50%.   Left main was patent.   LAD has 10% ostial stenosis and 20-30% in-stent proximal stenosis.  Diagonal  #1 is patent.   Left circumflex is patent, which tapers down in the AV groove after giving  off OM-2.  OM-1 is large which was patent, which bifurcates into superior  and inferior branch.  OM-2 is large which is patent.  Dara Lords has 10-15%  proximal and mid stenosis.   Arteriotomy was closed with Perclose without complications.  The patient  tolerated the procedure well and was transferred to recovery room in stable  condition.                                                Robynn Pane, M.D.    MNH/MEDQ  D:  01/08/2002  T:  01/08/2002   Job:  [id number]   cc:   Cardiac Catheterization Laboratory

## 2010-07-30 NOTE — Op Note (Signed)
[place]  Patient:    [name] [name], [name] [name]                      MRN: [id number] Proc. Date: 07/21/00 Adm. Date:  [id number] Disc. Date: [id number] Attending:  Sonda Primes                           Operative Report  PREOPERATIVE DIAGNOSES: 1. Poor venous access. 2. Colon cancer.  POSTOPERATIVE DIAGNOSES: 1. Poor venous access. 2. Colon cancer.  PROCEDURE:  Port-A-Cath implantation.  SURGEON:  Mardene Celeste. Lurene Shadow, M.D.  ASSISTANT:  Nurse.  ANESTHESIA:  I used MAC.  I used 1% Xylocaine with epinephrine.  DESCRIPTION OF PROCEDURE:  Following the induction of satisfactory general anesthesia, with the patient positioned supinely, the anterior chest and neck are prepped and draped to be included in the sterile operative field.  I infiltrated the right subclavian region with 1% Xylocaine plain and then made a subclavian stick into the right subclavian vein, threaded the guidewire into the central venous system under fluoroscopic guidance.  I then created a pocket on the anterior chest wall by first injecting 1% Xylocaine with epinephrine into the anterior chest wall, using a transverse incision and creating a pocket on the anterior chest wall.  A tunnel was created from the pocket up to the shoulder site and the Silastic catheter pulled through.  The Silastic catheter was flushed with heparinized saline.  I used a size 10 [employer] introducer-dilator system over the guidewire and inserted into the central venous system.  The guidewire and dilator were removed and the Silastic catheter threaded into the introducer and positioned at the atrial-vena caval junction.  Inflow of heparinized saline and blood return noted to be excellent.  The external portion of the catheter in the pocket was then trimmed, and the reservoir was securely attached.  The reservoir was seated in the pocket and sutured in place with 2-0 silk sutures.  Inflow  of heparinized saline and blood return through the reservoir were noted to be excellent.  Sponge, instrument, and sharp counts were verified.  The final evaluation of the placement under fluoroscopy showed the Port-A-Cath to be well-seated and no kinks, bends, or unusual turns in the course of the catheter.  The wounds were then closed in layers as follows:  Both wounds closed with subcutaneous tissues closed with interrupted 3-0 Vicryl, skin closed with subcuticular stitch of 5-0 Monocryl.  The wounds were reinforced with Steri-Strips, sterile dressing applied, anesthetic reversed, patient removed from the operating room to the recovery room in stable condition, having tolerated the procedure well. DD:  07/21/00 TD:  07/21/00 Job: [id number]

## 2010-07-30 NOTE — Op Note (Signed)
NAME:  [name] [name], [name] [name]                       ACCOUNT NO.:  [id number]   MEDICAL RECORD NO.:  [id number]                   PATIENT TYPE:  AMB   LOCATION:  ENDO                                 FACILITY:  [facility]   PHYSICIAN:  Petra Kuba, M.D.                 DATE OF BIRTH:  1950/09/24   DATE OF PROCEDURE:  04/22/2002  DATE OF DISCHARGE:                                 OPERATIVE REPORT   PROCEDURE PERFORMED:  Colonoscopy with biopsy.   ENDOSCOPIST:  Petra Kuba, M.D.   MEDICINES USED:  Demerol 60 mg, Versed 6 mg.   INDICATIONS FOR PROCEDURE:  History of colon cancer due for repeat  screening.   DESCRIPTION OF PROCEDURE:  Rectal inspection was pertinent for external  hemorrhoids, small.  Digital exam was negative.  A video pediatric  adjustable colonoscope was inserted and easily advanced around the colon to  the cecum.  This did not require any position changes or any abdominal  pressure.  No abnormalities were seen on insertion.  The cecum was  identified by the appendiceal orifice and the ileocecal valve.  In fact the  scope was inserted a short ways into the terminal ileum which was normal.  Photodocumentation was obtained.  The scope was slowly withdrawn.  The prep  was adequate.  There was minimal stool that required washing and suctioning.  On slow withdrawal through the colon, the cecum, ascending, transverse and  majority of the descending were all normal.  The anastomosis was seen at  about 30 cm which was normal.  The scope was slowly withdrawn.  In the  distal sigmoid, a questionable tiny polyp was seen and was cold biopsied  times one.  Once back in the rectum, the scope was retroflexed, pertinent  for some internal hemorrhoids.  The scope was straightened and readvanced to  the cecum one more time.  No additional findings were seen.  Air was  suctioned, scope removed.  The patient tolerated the procedure well without  obvious complication.   ENDOSCOPIC  DIAGNOSIS:  1. Internal and external tiny hemorrhoids.  2. Anastomosis at 30 cm.  3. Distal sigmoid questionable polyp, cold biopsied.  4. Otherwise within normal limits to the terminal ileum.    PLAN:  Yearly rectals and guaiacs per Dr. Arline Asp.  Happy to see back  p.r.n. and await pathology.  Probably recheck colon screening in three  years.                                                Petra Kuba, M.D.    MEM/MEDQ  D:  04/22/2002  T:  04/22/2002  Job:  [id number]   cc:   Eduardo Osier. Sharyn Lull, M.D.  [address]  [place]  [place] [address]  Fax: [phone]   Samul Dada, M.D.  [address] [facility]  [place]  [place] [address]  Fax: [phone]

## 2010-07-30 NOTE — Op Note (Signed)
[name] [name], [name] [name]             ACCOUNT NO.:  [id number]   MEDICAL RECORD NO.:  [id number]          PATIENT TYPE:  AMB   LOCATION:  ENDO                         FACILITY:  [facility]   PHYSICIAN:  Petra Kuba, M.D.    DATE OF BIRTH:  10-27-1950   DATE OF PROCEDURE:  04/05/2004  DATE OF DISCHARGE:                                 OPERATIVE REPORT   PROCEDURE:  Colonoscopy with polypectomy.   INDICATIONS:  Patient with a history of colon cancer, requesting repeat  screening.  Consent was signed after risks, benefits, methods, and options  thoroughly discussed in the office on multiple occasions.   MEDICINES USED:  Demerol 75 mg, Versed 7.5 mg.   PROCEDURE:  Rectal inspection was pertinent for small external hemorrhoids.  Digital exam was negative.  The video pediatric adjustable colonoscope was  inserted, easily advanced around the colon to the cecum, which was  identified by the appendiceal orifice and the ileocecal valve.  In fact, the  scope was inserted a short way into the terminal ileum, which was normal.  Photo documentation was obtained.  The scope was slowly withdrawn.  The prep  was adequate.  There was some liquid stool that required washing and  suctioning.  On slow withdrawal through the colon, the tiny proximal  ascending polyp was seen and was hot biopsied x1.  Also a proximal  transverse polyp was seen and was hot biopsied x1 and was put in the same  container.  The scope was slowly withdrawn.  No additional findings were  seen as we slowly withdrew back to the rectum.  The anastomosis was  confirmed in the distal to mid-sigmoid.  Anorectal pull-through and  retroflexion confirmed some small hemorrhoids, external greater than  internal.  The scope was straightened and readvanced a short way up the left  side of the colon, air was suctioned, the scope removed.  The patient  tolerated the procedure well.  There was no obvious immediate complication.   ENDOSCOPIC  DIAGNOSES:  1.  Internal-external minimal hemorrhoids.  2.  Sigmoid anastomosis.  3.  Two tiny polyps in the ascending and transverse, hot biopsied.  4.  Otherwise within normal limits to the terminal ileum.   PLAN:  Await pathology, but probably recheck colon screening in five years.  Happy to see back p.r.n.  Otherwise, return care to Drs. Harwani and  Murinson for the customary health care maintenance to include yearly rectals  and guaiacs.  Unfortunately, with his depression, I do not think he would be  a great candidate for treating his hepatitis C now but would re-evaluate per  the hepatitis clinic at some point in the future.                                               ______________________________  Petra Kuba, M.D.    MEM/MEDQ  D:  04/05/2004  T:  04/05/2004  Job:  [id number]   cc:   Gerarda Fraction.  Murinson, M.D.  [address] [facility]  [place]  [place] [address]  Fax: [phone]   Eduardo Osier. Sharyn Lull, M.D.  [address]  [place]  [place] [address]  Fax: [phone]

## 2010-10-07 ENCOUNTER — Other Ambulatory Visit (HOSPITAL_COMMUNITY): Payer: Self-pay | Admitting: Oncology

## 2010-10-07 ENCOUNTER — Encounter (HOSPITAL_BASED_OUTPATIENT_CLINIC_OR_DEPARTMENT_OTHER): Payer: Self-pay | Admitting: Oncology

## 2010-10-07 DIAGNOSIS — C189 Malignant neoplasm of colon, unspecified: Secondary | ICD-10-CM

## 2010-10-07 DIAGNOSIS — C187 Malignant neoplasm of sigmoid colon: Secondary | ICD-10-CM

## 2010-10-07 LAB — COMPREHENSIVE METABOLIC PANEL
Albumin: 3.9 g/dL (ref 3.5–5.2)
Alkaline Phosphatase: 66 U/L (ref 39–117)
BUN: 10 mg/dL (ref 6–23)
Glucose, Bld: 119 mg/dL — ABNORMAL HIGH (ref 70–99)
Potassium: 3.7 mEq/L (ref 3.5–5.3)
Total Bilirubin: 0.9 mg/dL (ref 0.3–1.2)

## 2010-10-07 LAB — CBC WITH DIFFERENTIAL/PLATELET
Basophils Absolute: 0 10*3/uL (ref 0.0–0.1)
EOS%: 2.5 % (ref 0.0–7.0)
HCT: 43.8 % (ref 38.4–49.9)
HGB: 14.9 g/dL (ref 13.0–17.1)
MCH: 33.3 pg (ref 27.2–33.4)
MCV: 98 fL (ref 79.3–98.0)
MONO%: 5.8 % (ref 0.0–14.0)
NEUT%: 58.4 % (ref 39.0–75.0)
Platelets: 192 10*3/uL (ref 140–400)

## 2010-12-13 ENCOUNTER — Other Ambulatory Visit (HOSPITAL_COMMUNITY): Payer: Self-pay | Admitting: Cardiology

## 2010-12-15 ENCOUNTER — Encounter (HOSPITAL_COMMUNITY)
Admission: RE | Admit: 2010-12-15 | Discharge: 2010-12-15 | Disposition: A | Discharge status: Home / Self Care | Payer: Medicaid Other | Source: Ambulatory Visit | Attending: Cardiology | Admitting: Cardiology

## 2010-12-15 ENCOUNTER — Ambulatory Visit (HOSPITAL_COMMUNITY)
Admission: RE | Admit: 2010-12-15 | Discharge: 2010-12-15 | Disposition: A | Discharge status: Home / Self Care | Payer: Medicaid Other | Source: Ambulatory Visit | Attending: Cardiology | Admitting: Cardiology

## 2010-12-15 DIAGNOSIS — Z01810 Encounter for preprocedural cardiovascular examination: Secondary | ICD-10-CM | POA: Insufficient documentation

## 2010-12-15 DIAGNOSIS — R51 Headache: Secondary | ICD-10-CM | POA: Insufficient documentation

## 2010-12-15 MED ORDER — TECHNETIUM TC 99M TETROFOSMIN IV KIT
30.0000 | PACK | Freq: Once | INTRAVENOUS | Status: AC | PRN
  Administered 2010-12-15: 30 via INTRAVENOUS

## 2010-12-15 MED ORDER — TECHNETIUM TC 99M TETROFOSMIN IV KIT
10.0000 | PACK | Freq: Once | INTRAVENOUS | Status: AC | PRN
  Administered 2010-12-15: 10 via INTRAVENOUS

## 2011-01-05 ENCOUNTER — Encounter (HOSPITAL_COMMUNITY)
Admission: RE | Admit: 2011-01-05 | Discharge: 2011-01-05 | Disposition: A | Discharge status: Home / Self Care | Payer: Medicaid Other | Source: Ambulatory Visit | Attending: Orthopedic Surgery | Admitting: Orthopedic Surgery

## 2011-01-05 ENCOUNTER — Ambulatory Visit (HOSPITAL_COMMUNITY)
Admission: RE | Admit: 2011-01-05 | Discharge: 2011-01-05 | Disposition: A | Discharge status: Home / Self Care | Payer: Medicaid Other | Source: Ambulatory Visit | Attending: Orthopedic Surgery | Admitting: Orthopedic Surgery

## 2011-01-05 ENCOUNTER — Other Ambulatory Visit (HOSPITAL_COMMUNITY): Payer: Self-pay | Admitting: Orthopedic Surgery

## 2011-01-05 DIAGNOSIS — F17200 Nicotine dependence, unspecified, uncomplicated: Secondary | ICD-10-CM | POA: Insufficient documentation

## 2011-01-05 DIAGNOSIS — S8990XA Unspecified injury of unspecified lower leg, initial encounter: Secondary | ICD-10-CM

## 2011-01-05 DIAGNOSIS — I10 Essential (primary) hypertension: Secondary | ICD-10-CM | POA: Insufficient documentation

## 2011-01-05 DIAGNOSIS — Z01812 Encounter for preprocedural laboratory examination: Secondary | ICD-10-CM | POA: Insufficient documentation

## 2011-01-05 DIAGNOSIS — Z01818 Encounter for other preprocedural examination: Secondary | ICD-10-CM | POA: Insufficient documentation

## 2011-01-05 LAB — COMPREHENSIVE METABOLIC PANEL
Alkaline Phosphatase: 111 U/L (ref 39–117)
BUN: 9 mg/dL (ref 6–23)
Calcium: 10 mg/dL (ref 8.4–10.5)
GFR calc Af Amer: 71 mL/min — ABNORMAL LOW (ref >90)
GFR calc non Af Amer: 61 mL/min — ABNORMAL LOW (ref >90)
Glucose, Bld: 94 mg/dL (ref 70–99)
Total Protein: 7.4 g/dL (ref 6.0–8.3)

## 2011-01-05 LAB — CBC
MCH: 33.3 pg (ref 26.0–34.0)
MCV: 95.8 fL (ref 78.0–100.0)
Platelets: 229 10*3/uL (ref 150–400)
RDW: 13.3 % (ref 11.5–15.5)

## 2011-01-05 LAB — TYPE AND SCREEN: ABO/RH(D): O POS

## 2011-01-05 LAB — SURGICAL PCR SCREEN: MRSA, PCR: NEGATIVE

## 2011-01-10 ENCOUNTER — Inpatient Hospital Stay (HOSPITAL_COMMUNITY): Payer: Medicaid Other

## 2011-01-10 ENCOUNTER — Inpatient Hospital Stay (HOSPITAL_COMMUNITY)
Admission: RE | Admit: 2011-01-10 | Discharge: 2011-01-26 | DRG: 470 | Disposition: A | Discharge status: Skilled Nursing Facility | Payer: Medicaid Other | Source: Ambulatory Visit | Attending: Orthopedic Surgery | Admitting: Orthopedic Surgery

## 2011-01-10 DIAGNOSIS — K56609 Unspecified intestinal obstruction, unspecified as to partial versus complete obstruction: Secondary | ICD-10-CM | POA: Diagnosis not present

## 2011-01-10 DIAGNOSIS — E860 Dehydration: Secondary | ICD-10-CM | POA: Diagnosis not present

## 2011-01-10 DIAGNOSIS — B1920 Unspecified viral hepatitis C without hepatic coma: Secondary | ICD-10-CM | POA: Diagnosis present

## 2011-01-10 DIAGNOSIS — I10 Essential (primary) hypertension: Secondary | ICD-10-CM | POA: Diagnosis present

## 2011-01-10 DIAGNOSIS — M25469 Effusion, unspecified knee: Secondary | ICD-10-CM | POA: Diagnosis not present

## 2011-01-10 DIAGNOSIS — K929 Disease of digestive system, unspecified: Secondary | ICD-10-CM | POA: Diagnosis not present

## 2011-01-10 DIAGNOSIS — K560 Paralytic ileus: Secondary | ICD-10-CM | POA: Diagnosis not present

## 2011-01-10 DIAGNOSIS — E46 Unspecified protein-calorie malnutrition: Secondary | ICD-10-CM | POA: Diagnosis not present

## 2011-01-10 DIAGNOSIS — Z01818 Encounter for other preprocedural examination: Secondary | ICD-10-CM

## 2011-01-10 DIAGNOSIS — I2589 Other forms of chronic ischemic heart disease: Secondary | ICD-10-CM | POA: Diagnosis present

## 2011-01-10 DIAGNOSIS — N289 Disorder of kidney and ureter, unspecified: Secondary | ICD-10-CM | POA: Diagnosis not present

## 2011-01-10 DIAGNOSIS — D62 Acute posthemorrhagic anemia: Secondary | ICD-10-CM | POA: Diagnosis not present

## 2011-01-10 DIAGNOSIS — I2510 Atherosclerotic heart disease of native coronary artery without angina pectoris: Secondary | ICD-10-CM | POA: Diagnosis present

## 2011-01-10 DIAGNOSIS — Z01812 Encounter for preprocedural laboratory examination: Secondary | ICD-10-CM

## 2011-01-10 DIAGNOSIS — E7800 Pure hypercholesterolemia, unspecified: Secondary | ICD-10-CM | POA: Diagnosis present

## 2011-01-10 DIAGNOSIS — N390 Urinary tract infection, site not specified: Secondary | ICD-10-CM | POA: Diagnosis not present

## 2011-01-10 DIAGNOSIS — F17200 Nicotine dependence, unspecified, uncomplicated: Secondary | ICD-10-CM | POA: Diagnosis present

## 2011-01-10 DIAGNOSIS — Z85038 Personal history of other malignant neoplasm of large intestine: Secondary | ICD-10-CM

## 2011-01-10 DIAGNOSIS — M1710 Unilateral primary osteoarthritis, unspecified knee: Principal | ICD-10-CM | POA: Diagnosis present

## 2011-01-10 DIAGNOSIS — M1712 Unilateral primary osteoarthritis, left knee: Secondary | ICD-10-CM | POA: Diagnosis present

## 2011-01-10 DIAGNOSIS — Y831 Surgical operation with implant of artificial internal device as the cause of abnormal reaction of the patient, or of later complication, without mention of misadventure at the time of the procedure: Secondary | ICD-10-CM | POA: Diagnosis not present

## 2011-01-11 LAB — CBC
HCT: 37.1 % — ABNORMAL LOW (ref 39.0–52.0)
MCH: 33.4 pg (ref 26.0–34.0)
MCV: 96.1 fL (ref 78.0–100.0)
Platelets: 222 10*3/uL (ref 150–400)
RBC: 3.86 MIL/uL — ABNORMAL LOW (ref 4.22–5.81)

## 2011-01-11 LAB — BASIC METABOLIC PANEL
BUN: 9 mg/dL (ref 6–23)
CO2: 25 mEq/L (ref 19–32)
GFR calc non Af Amer: 74 mL/min — ABNORMAL LOW (ref >90)
Glucose, Bld: 109 mg/dL — ABNORMAL HIGH (ref 70–99)
Potassium: 3.9 mEq/L (ref 3.5–5.1)

## 2011-01-12 LAB — CBC
HCT: 39.8 % (ref 39.0–52.0)
Hemoglobin: 13.2 g/dL (ref 13.0–17.0)
MCH: 32.6 pg (ref 26.0–34.0)
MCHC: 33.2 g/dL (ref 30.0–36.0)
MCV: 98.3 fL (ref 78.0–100.0)
RBC: 4.05 MIL/uL — ABNORMAL LOW (ref 4.22–5.81)

## 2011-01-12 LAB — BASIC METABOLIC PANEL
BUN: 8 mg/dL (ref 6–23)
CO2: 27 mEq/L (ref 19–32)
Chloride: 98 mEq/L (ref 96–112)
Creatinine, Ser: 1.37 mg/dL — ABNORMAL HIGH (ref 0.50–1.35)
Glucose, Bld: 74 mg/dL (ref 70–99)
Potassium: 4 mEq/L (ref 3.5–5.1)

## 2011-01-12 LAB — PROTIME-INR: Prothrombin Time: 19.9 seconds — ABNORMAL HIGH (ref 11.6–15.2)

## 2011-01-12 NOTE — Op Note (Signed)
NAMEDERRELL, MILANES NO.:  [id number]  MEDICAL RECORD NO.:  [id number]  LOCATION:  5017                         FACILITY:  [facility]  PHYSICIAN:  Eulas Post, MD    DATE OF BIRTH:  March 16, 1950  DATE OF PROCEDURE:  01/10/2011 DATE OF DISCHARGE:                              OPERATIVE REPORT   PREOPERATIVE DIAGNOSIS:  Right knee medial compartment osteoarthritis, question patellofemoral arthritis.  POSTOPERATIVE DIAGNOSIS:  Right knee medial and patellofemoral compartment arthritis with some early lateral compartment arthritis as well.  OPERATIVE PROCEDURE:  Right total knee arthroplasty.  ATTENDING SURGEON:  Eulas Post, MD  FIRST ASSISTANT:  Janace Litten, orthopedic PA-C, present and scrubbed throughout the case and critical for assistance with instrumentation, exposure, retraction and closure.  OPERATIVE IMPLANTS:  DePuy LCS tibial rotating platform, size 5 tibia MBT keel with a cemented primary femoral component standard plus right with a 38-mm oval dome 3-peg patellar button, and an LCS 15 mm rotating platform tibial insert.  PREOPERATIVE INDICATIONS:  [name] is a 60 year old gentleman who had progressive right knee osteoarthritis.  He failed conservative treatment and elected for surgical management.  The risks, benefits, alternatives were discussed before the procedure including, but not limited to risks of infection, bleeding, nerve injury, stiffness, loss of function, loosening, the need for revision surgery, blood clots, cardiopulmonary complications, among others and he is willing to proceed.  We discussed the option of partial knee replacement versus total knee, and I told him that I would make the decision based on operative findings intraoperatively.  He understood preoperatively that there was a possibility of him having a total knee, as well as the possibility of a partial knee.  OPERATIVE FINDINGS:  The medial  compartment had extensive grade 4 chondral loss.  The patellofemoral joint also had substantial chondral loss that was diffuse on the femoral trochlear side.  The patellar side was still in relatively good condition.  There was extensive pannus formation all superiorly and diffusely around the knee.  The lateral compartment also had an area of lateral pitting, and also had some osteophyte formation laterally.  Given the findings in all 3 compartments, particularly in the patellofemoral compartment, I did not feel partial knee replacement would adequately address his structural pathology, and therefore proceeded with total knee arthroplasty.  OPERATIVE PROCEDURE:  The patient was brought to the operating room and placed in supine position.  IV antibiotics were given.  Time-out was performed.  Foley was placed after general anesthesia was administered. The right lower extremity was initially suspended, in the leg holder and sterile prep and drape was performed.  I elevated and exsanguinated the leg and the tourniquet was inflated.  Total tourniquet time was 2 hours and 1 minute.  After performing the arthrotomy, I inspected the patellofemoral joint and the femoral trochlea demonstrated substantial disease and therefore I converted to a total knee arthroplasty.  The foot was elevated and a new drape was applied and sterile technique maintained, and I then completed the exposure for the total knee.  The menisci were excised, and the patella was able to be everted.  I did a quadriceps tendon splitting approach.  I had  an LCS rotating platform system available and this was utilized. I turned my attention to the tibia.  The extramedullary tibial cutting jig was applied, and I exposed the tibia after removing osteophytes within the notch.  I resected the proximal tibia and took less off the medial side, then off the lateral side.  I then placed my jig off the tibial cut and performed my  anterior and posterior cuts using the appropriate jig.  I took care not to notch.  Appropriate rotation was also confirmed.  I then measured my flexion gap, which measured to 12.5. I then placed the intramedullary jig.  I then assembled my distal cutting jig and pinned this into place.  I also sized the femur which was a standard plus right.  I then resected my distal femoral cut, and then I trialed the size of this, which was too tight to accommodate a 12.5.  Therefore, I resected the femur once more, and this in fact was still too tight, and so I took 2 more mm off the distal femur.  Finally, my third cut had adequate extension gaps in order to accommodate a 12.5 to match the posterior flexion gap.  I then applied the chamfer cut jig and cut my chamfers as well as my box and then removed all excess bone.  The proximal tibia was exposed, and I sized this to a size 5.  This was centered on the tibia and matched the medial tibial condyle well, and then this was pinned in place and then drilled and reamed and prepared and punched.  I placed a trial 12.5 and polyethylene as well as the femoral component and it was found to have excellent fit and appropriate stability.  I measured the patella which measured to a 29, and I resected down to 18 and then placed a size 38-mm patellar button after drilling it.  I trialed and the patella tracked well and all of the trial components removed.  I irrigated all of the wounds copiously and cemented in the tibial and femoral and patellar components using 2 bags of Depuy CMW 1 bone cement. All loose cement was removed.  The wounds were irrigated copiously.  I then repaired the retinaculum and the patella tracked very well.  There was no thumb technique.  The quadriceps tendon and patellar retinaculum was repaired with #1 Vicryl followed by Vicryl for the subcutaneous tissue and Monocryl with Steri-Strips and sterile gauze for the skin. The  tourniquet was released.  The patient was awakened and returned to the PACU in stable and satisfactory condition.  There were no complications, and he tolerated the procedure well.  I had appropriate stability with full range of motion after all implants were in.     Eulas Post, MD     JPL/MEDQ  D:  01/10/2011  T:  01/11/2011  Job:  [id number]  Electronically Signed by Teryl Lucy MD on 01/12/2011 09:24:09 AM

## 2011-01-13 ENCOUNTER — Inpatient Hospital Stay (HOSPITAL_COMMUNITY): Payer: Medicaid Other

## 2011-01-13 DIAGNOSIS — I10 Essential (primary) hypertension: Secondary | ICD-10-CM

## 2011-01-13 DIAGNOSIS — K56609 Unspecified intestinal obstruction, unspecified as to partial versus complete obstruction: Secondary | ICD-10-CM

## 2011-01-13 LAB — CBC
Hemoglobin: 13.4 g/dL (ref 13.0–17.0)
MCH: 33.1 pg (ref 26.0–34.0)
MCV: 94.8 fL (ref 78.0–100.0)
Platelets: 215 10*3/uL (ref 150–400)
RBC: 4.05 MIL/uL — ABNORMAL LOW (ref 4.22–5.81)
WBC: 16.6 10*3/uL — ABNORMAL HIGH (ref 4.0–10.5)

## 2011-01-14 ENCOUNTER — Inpatient Hospital Stay (HOSPITAL_COMMUNITY): Payer: Medicaid Other

## 2011-01-14 LAB — BASIC METABOLIC PANEL
CO2: 25 mEq/L (ref 19–32)
Chloride: 105 mEq/L (ref 96–112)
Glucose, Bld: 99 mg/dL (ref 70–99)
Potassium: 3.9 mEq/L (ref 3.5–5.1)
Sodium: 139 mEq/L (ref 135–145)

## 2011-01-14 LAB — CBC
Hemoglobin: 12.1 g/dL — ABNORMAL LOW (ref 13.0–17.0)
MCH: 32.9 pg (ref 26.0–34.0)
MCV: 96.7 fL (ref 78.0–100.0)
Platelets: 232 10*3/uL (ref 150–400)
RBC: 3.68 MIL/uL — ABNORMAL LOW (ref 4.22–5.81)
WBC: 13 10*3/uL — ABNORMAL HIGH (ref 4.0–10.5)

## 2011-01-15 ENCOUNTER — Inpatient Hospital Stay (HOSPITAL_COMMUNITY): Payer: Medicaid Other

## 2011-01-15 LAB — BASIC METABOLIC PANEL
CO2: 24 mEq/L (ref 19–32)
GFR calc non Af Amer: 71 mL/min — ABNORMAL LOW (ref >90)
Glucose, Bld: 101 mg/dL — ABNORMAL HIGH (ref 70–99)
Potassium: 3.7 mEq/L (ref 3.5–5.1)
Sodium: 136 mEq/L (ref 135–145)

## 2011-01-15 LAB — CBC
HCT: 32 % — ABNORMAL LOW (ref 39.0–52.0)
Hemoglobin: 11 g/dL — ABNORMAL LOW (ref 13.0–17.0)
MCV: 95.8 fL (ref 78.0–100.0)
Platelets: 238 10*3/uL (ref 150–400)
RBC: 3.34 MIL/uL — ABNORMAL LOW (ref 4.22–5.81)
WBC: 8.9 10*3/uL (ref 4.0–10.5)

## 2011-01-15 MED ORDER — KCL IN DEXTROSE-NACL 20-5-0.45 MEQ/L-%-% IV SOLN
INTRAVENOUS | Status: AC
  Administered 2011-01-16 – 2011-01-20 (×7): via INTRAVENOUS
  Filled 2011-01-15 (×20): qty 1000

## 2011-01-15 MED ORDER — ONDANSETRON HCL 4 MG/2ML IJ SOLN
4.0000 mg | Freq: Four times a day (QID) | INTRAMUSCULAR | Status: DC | PRN
  Administered 2011-01-17: 4 mg via INTRAVENOUS
  Filled 2011-01-15: qty 2

## 2011-01-15 MED ORDER — PROMETHAZINE HCL 25 MG/ML IJ SOLN
12.5000 mg | Freq: Four times a day (QID) | INTRAMUSCULAR | Status: DC | PRN
  Administered 2011-01-17 (×2): 12.5 mg via INTRAVENOUS
  Filled 2011-01-15: qty 1

## 2011-01-15 MED ORDER — HYDROMORPHONE HCL PF 1 MG/ML IJ SOLN
0.5000 mg | INTRAMUSCULAR | Status: DC | PRN
  Administered 2011-01-16 (×9): 1 mg via INTRAVENOUS
  Filled 2011-01-15: qty 1

## 2011-01-15 MED ORDER — DEXTROSE 5 % IV SOLN
500.0000 mg | Freq: Four times a day (QID) | INTRAVENOUS | Status: DC | PRN
  Filled 2011-01-15: qty 5

## 2011-01-15 MED ORDER — METOPROLOL TARTRATE 1 MG/ML IV SOLN
2.5000 mg | Freq: Two times a day (BID) | INTRAVENOUS | Status: DC
  Administered 2011-01-16 – 2011-01-26 (×17): 2.5 mg via INTRAVENOUS
  Filled 2011-01-15 (×25): qty 5

## 2011-01-15 MED ORDER — BISACODYL 10 MG RE SUPP
10.0000 mg | Freq: Every day | RECTAL | Status: DC | PRN
  Filled 2011-01-15: qty 1

## 2011-01-15 MED ORDER — IOHEXOL 300 MG/ML  SOLN
100.0000 mL | Freq: Once | INTRAMUSCULAR | Status: AC | PRN
  Administered 2011-01-15: 100 mL via INTRAVENOUS

## 2011-01-15 MED ORDER — FLEET ENEMA 7-19 GM/118ML RE ENEM
1.0000 | ENEMA | Freq: Every day | RECTAL | Status: DC | PRN
  Filled 2011-01-15: qty 1

## 2011-01-15 MED ORDER — ACETAMINOPHEN 650 MG RE SUPP: 325.0000 mg | RECTAL | Status: DC | PRN

## 2011-01-15 MED ORDER — METHOCARBAMOL 100 MG/ML IJ SOLN
500.0000 mg | Freq: Four times a day (QID) | INTRAMUSCULAR | Status: DC | PRN
  Filled 2011-01-15: qty 5

## 2011-01-15 MED ORDER — METOCLOPRAMIDE HCL 5 MG/ML IJ SOLN
5.0000 mg | Freq: Three times a day (TID) | INTRAMUSCULAR | Status: DC | PRN
  Filled 2011-01-15: qty 2

## 2011-01-15 MED ORDER — PHENOL 1.4 % MT LIQD: 1.0000 | OROMUCOSAL | Status: DC | PRN

## 2011-01-15 NOTE — Consult Note (Signed)
[name] [name], [name] [name] NO.:  [id number]  MEDICAL RECORD NO.:  [id number]  LOCATION:  5017                         FACILITY:  [facility]  PHYSICIAN:  Mary Sella. Andrey Campanile, MD     DATE OF BIRTH:  07-Sep-1950  DATE OF CONSULTATION:  01/13/2011 DATE OF DISCHARGE:                                CONSULTATION   TIME OF CONSULTATION:  10:36 a.m.  REQUESTING PHYSICIAN:  Eulas Post, MD  REASON FOR CONSULTATION:  Small bowel obstruction.  HISTORY OF PRESENT ILLNESS:  [name] [name] is a 60 year old black male who denied any past medical history; however, upon reviewing e-chart, he has a past medical history including coronary artery disease, ischemic cardiomyopathy, hypertension, history of hepatitis C, history of colon cancer, and nephrolithiasis.  The patient was admitted on Monday for right total knee arthroplasty.  He states his last bowel movement was Monday.  Last night, the patient states he developed abdominal distention with nausea and vomiting and some abdominal pain.  He is still passing some flatus, but has continued to have nausea and emesis. This morning, a KUB was obtained, which revealed a questionable small bowel obstruction, however, there were some air in the colon.  At this time, an NG tube was placed for gastric decompression.  The patient otherwise admits to right knee pain, but otherwise denies any other complaints.  We have been asked to evaluate the patient for further recommendations.  REVIEW OF SYSTEMS:  Please see HPI.  Otherwise, all other systems have been reviewed and are negative.  FAMILY HISTORY:  Noncontributory.  PAST MEDICAL HISTORY:  Per e-chart is the patient denies any past medical history. 1. Coronary artery disease status post stenting. 2. Ischemic cardiomyopathy. 3. Hypertension. 4. History of hepatitis C. 5. History of colon cancer. 6. Nephrolithiasis.  PAST SURGICAL HISTORY: 1. Status post this hospitalization right  TKA. 2. Status post exploratory laparotomy with left hemicolectomy for     colon cancer. 3. History of prior knee surgery.  SOCIAL HISTORY:  The patient is married with one child.  He admits to smoking 1-2 cigarettes a day.  He admits to rare use of alcohol.  He states that he does not work as he is on disability for mental disability.  ALLERGIES: 1. SULFA. 2. NSAIDs.  MEDICATIONS AT HOME: 1. Lisinopril 20 mg daily. 2. Lipitor 10 mg daily.  PHYSICAL EXAMINATION:  GENERAL:  [name] [name] is a pleasant yet very sleepy secondary to medicine 60 year old black male who is currently lying in bed in no acute distress. VITAL SIGNS:  Temperature 99, temperature max is 101.7 yesterday, pulse ranging from 108-117, respirations 18, blood pressure 159/108. HEENT:  Head is normocephalic, atraumatic.  Sclerae noninjected.  Pupils are equal, round, and reactive to light.  Ears and nose without any obvious masses or lesions.  No rhinorrhea.  Mouth is pink, but dry.  He does have an NG tube in his right naris with approximately 100 mL of brown output. HEART:  Regular rhythm; however, he is tachycardic.  Normal S1 and S2. No murmurs, gallops, or rubs are noted.  He does have palpable carotid, radial, and pedal pulses bilaterally. LUNGS:  Clear auscultation bilaterally with no  wheezes, rhonchi, or rales noted.  Respiratory effort is nonlabored. ABDOMEN:  Soft, but distended with absent bowel sounds.  He is tender throughout, but the greatest is noted in the right upper quadrant.  He does have a midline scar from his prior laparotomies.  Otherwise, no masses, hernias, or organomegaly are noted. MUSCULOSKELETAL:  His bilateral upper extremities are symmetrical.  He does have an Ace bandage on his right leg as he has recently had a total knee replacement.  This Ace bandage is not removed.  Otherwise, no cyanosis, clubbing, or edema are noted in other extremities. SKIN:  Warm and dry with no masses,  lesions, or rashes. PSYCH:  The patient is oriented, however, he is somewhat obtunded secondary to some type of medicine whether it be Phenergan or pain medicines.  LABS AND DIAGNOSTICS:  INR is 3.36.  White blood cell count is 16,600, hemoglobin 13.4, hematocrit 38.4, platelet count is 215.  Sodium yesterday was 134, potassium 4.0, glucose 74, BUN 8, creatinine 1.37. Abdominal x-ray this morning reveals gaseous distention of the stomach and small bowel with decompressed colon suggesting small-bowel obstruction.  IMPRESSION: 1. Status post right total knee arthroplasty. 2. Postoperative ileus. 3. Hypertension. 4. Dehydration. 5. Tachycardia likely secondary to #4. 6. Leukocytosis. 7. Acute renal insufficiency likely secondary to #4 as well.  PLAN:  I have discussed this patient with Apolinar Junes, the PA with Dr. Dion Saucier.  I am going to start the patient on IV fluids as the patient is likely dehydrated as he has been n.p.o. and NG tube is in place. Hopefully the IV fluids will help with his elevated creatinine and his tachycardia.  Since the patient is unable to take his p.o. meds, we will place these on hold.  I will add p.r.n. Vasotec for his hypertension as his last blood pressure was noted to be 159/108.  In the meantime, we will continue the patient with NG tube and bowel rest for postoperative ileus as this does not appear to be a bowel obstruction as he is still passing flatus and has some air in his colon.  We will repeat a KUB in the morning and follow this.  We would recommend ambulation as much as possible.  Obviously, the patient is unable to mobilize too much given his recent knee replacement, however, as much as possible is recommended.  We also recommend decreasing his narcotics which are able.  We will recheck labs in the morning including a CBC and a BMET to follow his increased white blood cell count as well as his increased creatinine.  We will follow this patient  along with you.   HOLD ACEI GIVEN SLIGHT BUMP IN CREATININE. OTHERWISE AS ABOVE  Letha Cape, PA   ______________________________ Mary Sella. Andrey Campanile, MD    KEO/MEDQ  D:  01/13/2011  T:  01/13/2011  Job:  [id number]  cc:   Eulas Post, MD  Electronically Signed by Barnetta Chapel PA on 01/13/2011 04:11:18 PM Electronically Signed by Gaynelle Adu M.D. on 01/15/2011 01:23:45 PM

## 2011-01-16 ENCOUNTER — Inpatient Hospital Stay (HOSPITAL_COMMUNITY): Payer: Medicaid Other

## 2011-01-16 DIAGNOSIS — K56609 Unspecified intestinal obstruction, unspecified as to partial versus complete obstruction: Secondary | ICD-10-CM | POA: Diagnosis not present

## 2011-01-16 LAB — CBC
MCH: 32.7 pg (ref 26.0–34.0)
MCHC: 34.7 g/dL (ref 30.0–36.0)
MCV: 94 fL (ref 78.0–100.0)
Platelets: 254 10*3/uL (ref 150–400)
RBC: 3.52 MIL/uL — ABNORMAL LOW (ref 4.22–5.81)
RDW: 13.3 % (ref 11.5–15.5)

## 2011-01-16 MED ORDER — OXYCODONE-ACETAMINOPHEN 5-325 MG PO TABS
1.0000 | ORAL_TABLET | ORAL | Status: DC | PRN
  Administered 2011-01-23 – 2011-01-26 (×15): 2 via ORAL
  Filled 2011-01-16 (×16): qty 2

## 2011-01-16 MED ORDER — METOCLOPRAMIDE HCL 5 MG/ML IJ SOLN
10.0000 mg | Freq: Four times a day (QID) | INTRAMUSCULAR | Status: DC
  Administered 2011-01-16 – 2011-01-26 (×37): 10 mg via INTRAVENOUS
  Filled 2011-01-16 (×45): qty 2

## 2011-01-16 MED ORDER — HYDROMORPHONE HCL PF 1 MG/ML IJ SOLN
0.5000 mg | INTRAMUSCULAR | Status: DC | PRN
  Administered 2011-01-17 – 2011-01-26 (×65): 0.5 mg via INTRAVENOUS
  Filled 2011-01-16 (×33): qty 1

## 2011-01-16 NOTE — Progress Notes (Signed)
Discussed new care plan regarding change of medication measures and interventions with patient. Discussed new interventions and measures are an attempt to decrease SBO. Pt reports flatus in present. Pt verbalizes understanding. Md also phoned pt's room and discussed in length with pt. Patient is tearful and reports he understands the changes.

## 2011-01-16 NOTE — Progress Notes (Signed)
PATIENT ID:      [name] [name]  MRN:     [id number] DOB/AGE:    11/01/1950 / 60 y.o.    PROGRESS NOTE Subjective:  negative for Chest Pain  negative for Shortness of Breath  negative for Nausea/Vomiting   negative for Calf Pain  negative for Bowel Movement   Tolerating Diet: no         Patient reports pain as 3 on 0-10 scale.    Objective: Vital signs in last 24 hours: Temp:  [98.7 F (37.1 C)-99.5 F (37.5 C)] 98.7 F (37.1 C) (11/04 0700) Pulse Rate:  [56-89] 56  (11/04 0700) Resp:  [20] 20  (11/04 0700) BP: (154-155)/(89-91) 155/89 mmHg (11/04 0700)    Intake/Output from previous day: I/O last 3 completed shifts: In: 4685 [P.O.:960; I.V.:2400; Other:700; NG/GT:625] Out: 1550 [Urine:1550]   Intake/Output this shift:     LABORATORY DATA: HGB  Date/Time Value Range Status  10/07/2010 10:44 AM 14.9  13.0-17.1 (g/dL) Final     Hemoglobin  Date/Time Value Range Status  01/16/2011  8:48 AM 11.5* 13.0-17.0 (g/dL) Final     HCT  Date/Time Value Range Status  01/16/2011  8:48 AM 33.1* 39.0-52.0 (%) Final  10/07/2010 10:44 AM 43.8  38.4-49.9 (%) Final     Platelets  Date/Time Value Range Status  01/16/2011  8:48 AM 254  150-400 (10e3/uL) Final  10/07/2010 10:44 AM 192  140-400 (10e3/uL) Final    Sodium  Date/Time Value Range Status  01/15/2011  6:30 AM 136  135-145 (mEq/L) Final     Potassium  Date/Time Value Range Status  01/15/2011  6:30 AM 3.7  3.5-5.1 (mEq/L) Final     Chloride  Date/Time Value Range Status  01/15/2011  6:30 AM 103  96-112 (mEq/L) Final     CO2  Date/Time Value Range Status  01/15/2011  6:30 AM 24  19-32 (mEq/L) Final     BUN  Date/Time Value Range Status  01/15/2011  6:30 AM 12  6-23 (mg/dL) Final     Creatinine, Ser  Date/Time Value Range Status  01/15/2011  6:30 AM 1.11  0.50-1.35 (mg/dL) Final     Glucose, Bld  Date/Time Value Range Status  01/15/2011  6:30 AM 101* 70-99 (mg/dL) Final     Calcium  Date/Time Value Range  Status  01/15/2011  6:30 AM 8.5  8.4-10.5 (mg/dL) Final   Lab Results  Component Value Date   INR 3.68* 01/16/2011   INR 4.16* 01/15/2011   INR 3.36* 01/14/2011    Examination:  General appearance: alert, cooperative and no distress  Wound Exam: clean, dry, intact   Drainage:  None: wound tissue dry  Motor Exam Normal Intact  Sensory Exam Deep Peroneal normal  Assessment:          ADDITIONAL DIAGNOSIS:  Ileus  Plan: Physical Therapy as ordered Weight Bearing as Tolerated (WBAT)  DVT Prophylaxis:  Coumadin  DISCHARGE PLAN: Home  DISCHARGE NEEDS: HHPT, HHRN, CPM, Walker and 3-in-1 comode seat         Dalicia Kisner,STEPHEN D 01/16/2011, 9:28 AM

## 2011-01-16 NOTE — Progress Notes (Addendum)
  Subjective: Patient denies nausea, vomiting, or excessive pain. He thinks that the episodes of hiccups and burping are about the same. He does think less nasogastric contents are coming out of his nasogastric tube. He denies any flatus.  Objective: Vital signs in last 24 hours: Temp:  [98.7 F (37.1 C)-99.5 F (37.5 C)] 98.7 F (37.1 C) (11/04 0700) Pulse Rate:  [56-89] 56  (11/04 0700) Resp:  [20] 20  (11/04 0700) BP: (154-155)/(89-91) 155/89 mmHg (11/04 0700)    Intake/Output from previous day: 11/03 0701 - 11/04 0700 In: 4685 [P.O.:960; I.V.:2400; NG/GT:625] Out: 1550 [Urine:1550] Intake/Output this shift: Total I/O In: -  Out: 325 [Urine:325]  General appearance: alert GI: normal findings: bowel sounds normal and soft, non-tender and abnormal findings:  distended  Lab Results:   Basename 01/16/11 0848 01/15/11 0630  WBC 10.1 8.9  HGB 11.5* 11.0*  HCT 33.1* 32.0*  PLT 254 238   BMET  Basename 01/15/11 0630 01/14/11 0622  NA 136 139  K 3.7 3.9  CL 103 105  CO2 24 25  GLUCOSE 101* 99  BUN 12 17  CREATININE 1.11 1.26  CALCIUM 8.5 8.7   PT/INR  Basename 01/16/11 0625 01/15/11 0630  LABPROT 37.1* 40.8*  INR 3.68* 4.16*   Studies/Results: CT abd/pelvis 11/3 c/w SBO Abd film today NSC  Assessment/Plan: 1. SBO -- Continue NGT, NPO   LOS: 6 days    JEFFERY,MICHAEL J. 01/16/2011  SBO - continue conservative management.  May need exploratory laparotomy if no improvement.  Henderson Frampton K. 01/16/11 1615

## 2011-01-17 ENCOUNTER — Inpatient Hospital Stay (HOSPITAL_COMMUNITY): Payer: Medicaid Other

## 2011-01-17 DIAGNOSIS — M1712 Unilateral primary osteoarthritis, left knee: Secondary | ICD-10-CM | POA: Diagnosis present

## 2011-01-17 LAB — CBC
MCH: 30.7 pg (ref 26.0–34.0)
MCV: 94.6 fL (ref 78.0–100.0)
Platelets: 338 10*3/uL (ref 150–400)
RBC: 3.88 MIL/uL — ABNORMAL LOW (ref 4.22–5.81)

## 2011-01-17 LAB — BASIC METABOLIC PANEL
BUN: 9 mg/dL (ref 6–23)
GFR calc Af Amer: 85 mL/min — ABNORMAL LOW (ref >90)
GFR calc non Af Amer: 73 mL/min — ABNORMAL LOW (ref >90)
Potassium: 3.9 mEq/L (ref 3.5–5.1)

## 2011-01-17 MED ORDER — OXYCODONE-ACETAMINOPHEN 5-325 MG PO TABS: 1.0000 | ORAL_TABLET | ORAL | Status: DC | PRN

## 2011-01-17 MED ORDER — METOCLOPRAMIDE HCL 10 MG PO TABS: 10.0000 mg | ORAL_TABLET | Freq: Four times a day (QID) | ORAL | Status: DC

## 2011-01-17 MED ORDER — PROMETHAZINE HCL 25 MG PO TABS: 25.0000 mg | ORAL_TABLET | Freq: Four times a day (QID) | ORAL | Status: DC | PRN

## 2011-01-17 MED ORDER — OXYCODONE-ACETAMINOPHEN 10-325 MG PO TABS: 1.0000 | ORAL_TABLET | Freq: Four times a day (QID) | ORAL | Status: DC | PRN

## 2011-01-17 MED ORDER — OXYCODONE-ACETAMINOPHEN 10-325 MG PO TABS: 1.0000 | ORAL_TABLET | Freq: Four times a day (QID) | ORAL | Status: AC | PRN

## 2011-01-17 MED ORDER — ENOXAPARIN SODIUM 30 MG/0.3ML ~~LOC~~ SOLN: 40.0000 mg | SUBCUTANEOUS | Status: DC

## 2011-01-17 MED ORDER — BISACODYL 10 MG RE SUPP
10.0000 mg | Freq: Once | RECTAL | Status: AC
  Administered 2011-01-17: 10 mg via RECTAL

## 2011-01-17 MED ORDER — METHOCARBAMOL 500 MG PO TABS: 500.0000 mg | ORAL_TABLET | Freq: Four times a day (QID) | ORAL | Status: AC

## 2011-01-17 MED ORDER — METHOCARBAMOL 500 MG PO TABS: 500.0000 mg | ORAL_TABLET | Freq: Four times a day (QID) | ORAL | Status: DC

## 2011-01-17 MED ORDER — CALCIUM CARBONATE-VITAMIN D 500-200 MG-UNIT PO TABS: 1.0000 | ORAL_TABLET | Freq: Every day | ORAL | Status: DC

## 2011-01-17 NOTE — Progress Notes (Addendum)
  Subjective: Pt very sleepy.  Still with belching.  +emesis, bilious in nature while i was in the room.  No flatus, BM.  D/W pt need for NGT.  He is agreeable.  Objective: Vital signs in last 24 hours: Temp:  [98.6 F (37 C)-99.4 F (37.4 C)] 99.4 F (37.4 C) (11/05 0900) Pulse Rate:  [84-107] 107  (11/05 0900) Resp:  [18-20] 20  (11/05 0900) BP: (108-157)/(84-91) 108/87 mmHg (11/05 0900) SpO2:  [94 %-99 %] 96 % (11/05 0900) Last BM Date: 01/14/11 (smal amt  with suppository given)  Intake/Output from previous day: 11/04 0701 - 11/05 0700 In: 625 [I.V.:625] Out: 650 [Urine:325; Emesis/NG output:325] Intake/Output this shift: Total I/O In: 875 [I.V.:875] Out: -   PE:  Gen: Pt awake/alert/oriented x4 in no acute distress Eyes: PERRL, normal EOM. Psych:  Calm, relaxed.  No delerium/paranoia Neck: Supple, No tracheal deviation Neuro: CN 2-12 intact w/o focal deficits Chest: No pain w good excursion CV:  Pulses OK.  RRR no clicks/rubs Abdomen:  -BS, non-tender soft, nontender/nondistended Ext:  SCDs BLE.  No mjr edema   Lab Results:   BMET  [facility] 01/17/11 0718 01/15/11 0630  NA 134* 136  K 3.9 3.7  CL 95* 103  CO2 28 24  GLUCOSE 102* 101*  BUN 9 12  CREATININE 1.09 1.11  CALCIUM 9.4 8.5   PT/INR  Basename 01/17/11 0718 01/16/11 0625  LABPROT 35.2* 37.1*  INR 3.44* 3.68*     Studies/Results: Abd x-ray: no significant change in PSBO vs ileus   Anti-infectives: Anti-infectives    None       Assessment/Plan   1.s/p TKR 2. Post-op ileus vs PSBO  Plan: pt now agreeable to NGT again.  Will replace then and con't NPO and bowel rest. 2. Repeat abd films in AM 3. Ambulate and decrease narcotics as much as able 4. Pt does have stool and air in colon, so hopefully this is beginning to resolve some.  If he does not resolve and this appears to be more c/w PSBO, then he may require ex. Lap 5. con't to follow for now, may try suppository.   LOS: 7 days     OSBORNE,KELLY E 01/17/2011   -RN failed to replace NGT x 3 different RN attempts. Initially we were going to try fluoro placement, but I was able to place on the floor.  -Will try enema to see if it can help stimulate return of bowel function.  D/w pt & wife at bedside

## 2011-01-17 NOTE — Progress Notes (Addendum)
Pt has intermittently vomiting after pulling NG tube this last evening , attempt to insert down a NG tube with resistance.  Encouraged pt to try again with a pediatric size but still refused. Dr. Corliss Skains Notified.  No order received

## 2011-01-17 NOTE — Progress Notes (Signed)
ASSESSMENT COMPLETE,VS STABLE. PT VOMITING GREEN BILE AND COMPLAINING OF ABDOMINAL PAIN,PAC BRANDON AT BEDSIDE. DR LANDAU NOTIFIED BY PAC,BRANDON. [facility] STATES THAT GI DOCTOR WILL BE IN TO SEE PT.

## 2011-01-17 NOTE — Progress Notes (Signed)
Patient nauseated and vomiting. Spoke with RN. Will hold therapy today. Please call with any questions. [phone] Naveyah Iacovelli, Adline Potter

## 2011-01-17 NOTE — Progress Notes (Signed)
Subjective: Pt still having C/O Mild to moderate pain in Rt Knee but states that this is better.  Pt still having C/O Nausea and Vomiting.  He has apparently had his ng tube fall out, vs pulled out, and refused replacement.  Has then had increased nausea and vomiting.  Objective: Vital signs in last 24 hours: Temp:  [98.6 F (37 C)-99.3 F (37.4 C)] 98.6 F (37 C) (11/05 0524) Pulse Rate:  [84-100] 84  (11/05 0524) Resp:  [18] 18  (11/05 0524) BP: (136-157)/(84-90) 136/84 mmHg (11/05 0524) SpO2:  [94 %-99 %] 94 % (11/05 0524)  Intake/Output from previous day: 11/04 0701 - 11/05 0700 In: 625 [I.V.:625] Out: 650 [Urine:325; Emesis/NG output:325] Intake/Output this shift: Total I/O In: 875 [I.V.:875] Out: -    Basename 01/16/11 0848 01/15/11 0630  HGB 11.5* 11.0*    Basename 01/16/11 0848 01/15/11 0630  WBC 10.1 8.9  RBC 3.52* 3.34*  HCT 33.1* 32.0*  PLT 254 238    Basename 01/15/11 0630  NA 136  K 3.7  CL 103  CO2 24  BUN 12  CREATININE 1.11  GLUCOSE 101*  CALCIUM 8.5    Basename 01/16/11 0625 01/15/11 0630  LABPT -- --  INR 3.68* 4.16*    Neurologically intact Sensation intact distally Intact pulses distally Dorsiflexion/Plantar flexion intact Incision: scant drainage Compartment soft  Assessment/Plan: Rt Knee TKA Ileus vs SBO   Continue Plan per CCS for Ileus VS SBO Will Plan DC home when Ileus VS SBO resolved   DOUGLAS PARRY, BRANDON 01/17/2011, 8:31 AM

## 2011-01-17 NOTE — Progress Notes (Signed)
Dr Michaell Cowing IN TO SEE PT. NUMBER 18 NG TUBE PLACED IN RIGHT NARE WITH NO COMPLICATIONS,700ML GREEN DRAINAGE OBTAINED WHEN NGT PLACED. HOB ELEVATED. PT ABDOMEN DISTENDED,BS HYPOACTIVE. PT COMPLAINS OF ABDOMINAL PAIN.VS PRESENTLY STABLE. PT PROVIDED COMFORT MEASURES,WILL CONTINUE TO MONITOR.

## 2011-01-17 NOTE — Progress Notes (Signed)
Attempted to place NGT down L nare, but met resistance. Unable to advance tube. Notified pt's RN who notified MD.   Tammy Sours RN

## 2011-01-17 NOTE — Progress Notes (Signed)
Patient nauseated and vomiting. Spoke with RN. Will hold therapy today. Please call with any questions. [phone]

## 2011-01-18 ENCOUNTER — Inpatient Hospital Stay (HOSPITAL_COMMUNITY): Payer: Medicaid Other

## 2011-01-18 LAB — BASIC METABOLIC PANEL
BUN: 17 mg/dL (ref 6–23)
Calcium: 8.8 mg/dL (ref 8.4–10.5)
GFR calc non Af Amer: 57 mL/min — ABNORMAL LOW (ref >90)
Glucose, Bld: 109 mg/dL — ABNORMAL HIGH (ref 70–99)
Sodium: 135 mEq/L (ref 135–145)

## 2011-01-18 LAB — HEPATIC FUNCTION PANEL
AST: 51 U/L — ABNORMAL HIGH (ref 0–37)
Albumin: 2.7 g/dL — ABNORMAL LOW (ref 3.5–5.2)

## 2011-01-18 NOTE — Progress Notes (Signed)
Subjective:    C/O Mild to moderate pain in the Rt knee controlled with pain medicine. C/O nausea and vomiting getting better  Patient reports pain as 5 on 0-10 scale.    Objective: Vital signs in last 24 hours: Temp:  [98 F (36.7 C)-99.9 F (37.7 C)] 98.5 F (36.9 C) (11/06 0525) Pulse Rate:  [90-98] 90  (11/06 0525) Resp:  [18-20] 18  (11/06 0525) BP: (107-116)/(60-74) 116/72 mmHg (11/06 0525) SpO2:  [95 %-100 %] 95 % (11/06 0525)  Intake/Output from previous day: 11/05 0701 - 11/06 0700 In: 4115 [P.O.:240; I.V.:3875] Out: 4300 [Urine:1850; Emesis/NG output:2450] Intake/Output this shift: Total I/O In: -  Out: 100 [Emesis/NG output:100]   Basename 01/17/11 0718 01/16/11 0848  HGB 11.9* 11.5*    Basename 01/17/11 0718 01/16/11 0848  WBC 11.3* 10.1  RBC 3.88* 3.52*  HCT 36.7* 33.1*  PLT 338 254    Basename 01/18/11 0643 01/17/11 0718  NA 135 134*  K 3.9 3.9  CL 99 95*  CO2 28 28  BUN 17 9  CREATININE 1.33 1.09  GLUCOSE 109* 102*  CALCIUM 8.8 9.4    Basename 01/18/11 0643 01/17/11 0718  LABPT -- --  INR 4.21* 3.44*    Neurologically intact Neurovascular intact Sensation intact distally Intact pulses distally Dorsiflexion/Plantar flexion intact Incision: scant drainage No cellulitis present Compartment soft  Assessment/Plan:    Rt TKA, SBO  Continue Plan per CCS for SBO   Up with therapy Ok to DC home from Ortho standpoint when ok with G-surg, but still needs SBO resolution. INR still elevated.  Hold anticoag.    Torrie Mayers, BRANDON 01/18/2011, 11:10 AM

## 2011-01-18 NOTE — Progress Notes (Addendum)
Physical Therapy Treatment Patient Details Name: [name] [name] MRN: [id number] DOB: 10-24-1950 Today's Date: 01/18/2011  PT Assessment/Plan  PT - Assessment/Plan Comments on Treatment Session: Pt ambulated very well today despite yesterdays event. Pt encouraged to sit up as much as he could tolerate. Pt performing HEP in room by himself per his report.  PT Plan: Discharge plan remains appropriate PT Goals  Acute Rehab PT Goals PT Goal Formulation: With patient Time For Goal Achievement: 7 days Pt will Ambulate: >150 feet;with modified independence;with rolling walker PT Goal: Ambulate - Progress: Progressing toward goal Pt will Go Up / Down Stairs: 3-5 stairs;with supervision;with rail(s) Pt will Perform Home Exercise Program: Independently (with correct technique) PT Goal: Perform Home Exercise Program - Progress: Progressing toward goal  PT Treatment Precautions/Restrictions  Restrictions Weight Bearing Restrictions: No RLE Weight Bearing: Weight bearing as tolerated Mobility (including Balance) Bed Mobility Bed Mobility: Yes Supine to Sit: 6: Modified independent (Device/Increase time) Transfers Transfers: Yes Sit to Stand: 6: Modified independent (Device/Increase time) Stand to Sit: 6: Modified independent (Device/Increase time) Ambulation/Gait Ambulation/Gait: Yes Ambulation/Gait Assistance: Other (comment) (Minguard A. Cues for safety/RW positioning) Ambulation Distance (Feet): 180 Feet Assistive device: Rolling walker Gait Pattern: Trunk flexed;Step-to pattern;Antalgic Stairs: No (Pt declined practice b/c of pain) Wheelchair Mobility Wheelchair Mobility: No    Exercise  Total Joint Exercises Quad Sets: AROM;Strengthening;10 reps;Supine;Right Short Arc Quad: 10 reps;Supine;Strengthening;AAROM;Right Heel Slides: AAROM;Strengthening;Right;Supine;10 reps Straight Leg Raises: AAROM;Strengthening;Right;10 reps;Supine Knee Flexion: Other (comment) (Pt knee flexion =  55) End of Session PT - End of Session Equipment Utilized During Treatment: Gait belt Activity Tolerance: Patient tolerated treatment well Patient left: in chair;with call bell in reach General Behavior During Session: [employer] for tasks performed Cognition: [facility] for tasks performed  Fredrich Birks 01/18/2011, 12:14 PM
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
## 2011-01-18 NOTE — Progress Notes (Signed)
Occupational Therapy Treatment Patient Details Name: [name] [name] MRN: [id number] DOB: March 11, 1951 Today's Date: 01/18/2011  OT Assessment/Plan OT Assessment/Plan OT Plan: Discharge plan remains appropriate OT Frequency: Min 2X/week Follow Up Recommendations: None OT Goals Acute Rehab OT Goals OT Goal Formulation: With patient Time For Goal Achievement: 7 days ADL Goals Pt Will Perform Grooming: with modified independence;Standing at sink;Other (comment) (3 grooming tasks at sink) ADL Goal: Grooming - Progress: Progressing toward goals Pt Will Perform Lower Body Bathing: with modified independence;Sit to stand from bed ADL Goal: Lower Body Bathing - Progress: Progressing toward goals Pt Will Perform Lower Body Dressing: with modified independence;Sit to stand from bed ADL Goal: Lower Body Dressing - Progress: Progressing toward goals Pt Will Transfer to Toilet: with modified independence;with DME;Drop arm 3-in-1 ADL Goal: Toilet Transfer - Progress: Progressing toward goals  OT Treatment Precautions/Restrictions  Restrictions Weight Bearing Restrictions: No RLE Weight Bearing: Weight bearing as tolerated   ADL ADL Lower Body Dressing: Performed;Minimal assistance Lower Body Dressing Details (indicate cue type and reason): Pt doffed R sock with min assist to get sock of toes while sitting in chair. Where Assessed - Lower Body Dressing: Unsupported;Sitting, chair Toilet Transfer: Simulated;Other (comment) (Pt transferred from bed to chair with min guard assist ) Toilet Transfer Method: Ambulating Equipment Used: Rolling walker Mobility  Bed Mobility Bed Mobility: Yes Supine to Sit: 6: Modified independent (Device/Increase time) Transfers Transfers: Yes Sit to Stand: 6: Modified independent (Device/Increase time) Stand to Sit: 6: Modified independent (Device/Increase time) Exercises Total Joint Exercises Quad Sets: AROM;Strengthening;10 reps;Supine;Right Short Arc  Quad: 10 reps;Supine;Strengthening;AAROM;Right Heel Slides: AAROM;Strengthening;Right;Supine;10 reps Straight Leg Raises: AAROM;Strengthening;Right;10 reps;Supine Knee Flexion: Other (comment) (Pt knee flexion = 55)  End of Session General Behavior During Session: [facility] for tasks performed Cognition: [facility] for tasks performed  Cipriano Mile  01/18/2011, 1:56 PM

## 2011-01-19 LAB — COMPREHENSIVE METABOLIC PANEL
ALT: 36 U/L (ref 0–53)
Albumin: 2.6 g/dL — ABNORMAL LOW (ref 3.5–5.2)
Alkaline Phosphatase: 56 U/L (ref 39–117)
BUN: 13 mg/dL (ref 6–23)
Chloride: 101 mEq/L (ref 96–112)
Glucose, Bld: 103 mg/dL — ABNORMAL HIGH (ref 70–99)
Potassium: 4.2 mEq/L (ref 3.5–5.1)
Sodium: 136 mEq/L (ref 135–145)
Total Bilirubin: 1.8 mg/dL — ABNORMAL HIGH (ref 0.3–1.2)
Total Protein: 7.3 g/dL (ref 6.0–8.3)

## 2011-01-19 LAB — CBC
HCT: 34 % — ABNORMAL LOW (ref 39.0–52.0)
Hemoglobin: 11.6 g/dL — ABNORMAL LOW (ref 13.0–17.0)
MCHC: 34.1 g/dL (ref 30.0–36.0)
RDW: 13.4 % (ref 11.5–15.5)
WBC: 8.1 10*3/uL (ref 4.0–10.5)

## 2011-01-19 LAB — PROTIME-INR
INR: 3.96 — ABNORMAL HIGH (ref 0.00–1.49)
Prothrombin Time: 39.3 seconds — ABNORMAL HIGH (ref 11.6–15.2)

## 2011-01-19 NOTE — Progress Notes (Signed)
Physical Therapy Treatment Patient Details Name: [name] [name] MRN: [id number] DOB: 1950-05-25 Today's Date: 01/19/2011  PT Assessment/Plan  PT - Assessment/Plan Comments on Treatment Session: Pt impulsive throughout treatment secondary to pain.  PT Plan: Discharge plan remains appropriate PT Goals  Acute Rehab PT Goals PT Goal: Ambulate - Progress: Progressing toward goal PT Goal: Perform Home Exercise Program - Progress: Progressing toward goal  PT Treatment Precautions/Restrictions  Precautions Precautions: Knee Restrictions Weight Bearing Restrictions: Yes RLE Weight Bearing: Weight bearing as tolerated Mobility (including Balance) Bed Mobility Supine to Sit: 6: Modified independent (Device/Increase time) Transfers Transfers: Yes Sit to Stand: 6: Modified independent (Device/Increase time) Stand to Sit: 6: Modified independent (Device/Increase time) (Pt sits to quickly. Elevating pain) Ambulation/Gait Ambulation/Gait: Yes Ambulation/Gait Assistance:  (MinGuard A ) Ambulation Distance (Feet): 180 Feet Assistive device: Rolling walker Gait Pattern: Decreased step length - right;Decreased step length - left;Step-to pattern;Trunk flexed Gait velocity: Pt ambulating impulsively  Stairs: No (Pt continues to decline) Wheelchair Mobility Wheelchair Mobility: No    Exercise  Total Joint Exercises Quad Sets: AROM;Strengthening;Right;10 reps;Supine Short Arc Quad: Right;AAROM;10 reps;Supine Heel Slides: AROM;Strengthening;Right;10 reps;Supine Straight Leg Raises: AAROM;Strengthening;Right;10 reps;Supine End of Session PT - End of Session Equipment Utilized During Treatment: Gait belt Activity Tolerance: Patient limited by pain Patient left: in chair General Behavior During Session: [facility] for tasks performed Cognition: [facility] for tasks performed  Robinette, Adline Potter 01/19/2011, 11:23 AM

## 2011-01-19 NOTE — Progress Notes (Signed)
  Subjective: Pt reports some flatus.   Hungry.   Trying to work on decreasing narcotic use.  Objective: Vital signs in last 24 hours: Temp:  [99 F (37.2 C)-99.8 F (37.7 C)] 99 F (37.2 C) (11/07 0532) Pulse Rate:  [81-92] 81  (11/07 0532) Resp:  [18-20] 20  (11/07 0532) BP: (118-125)/(69-80) 118/73 mmHg (11/07 0532) SpO2:  [95 %-96 %] 96 % (11/07 0532) Last BM Date: 01/17/11  Intake/Output from previous day: 11/06 0701 - 11/07 0700 In: 1700 [I.V.:1700] Out: 2750 [Urine:250; Emesis/NG output:2500]  NGT with some bilious output, pt reports eating a whole pitcher of ice chips though.  Intake/Output this shift:    PE: Abd: much softer, less distended, decreased BS, NT,   General: Pt awake/alert/oriented x4 in no major acute distress Eyes: PERRL, normal EOM. Neuro: CN II-XII intact w/o focal sensory/motor deficits. Lymph: No head/neck/groin lymphadenopathy Psych:  No delerium/psychosis/paranoia HEENT: Normocephalic, Mucus membranes moist.  No thrush Neck: Supple, No tracheal deviation Chest: No pain w good excursion CV:  Pulses intact.  Regular rhythm Ext:  SCDs BLE.  No mjr edema.  No cyanosis Skin: No petechiae / purpurae   Lab Results:   Basename 01/19/11 0650 01/17/11 0718  WBC 8.1 11.3*  HGB 11.6* 11.9*  HCT 34.0* 36.7*  PLT 438* 338   BMET  Basename 01/18/11 0643 01/17/11 0718  NA 135 134*  K 3.9 3.9  CL 99 95*  CO2 28 28  GLUCOSE 109* 102*  BUN 17 9  CREATININE 1.33 1.09  CALCIUM 8.8 9.4   PT/INR  Basename 01/19/11 0650 01/18/11 0643  LABPROT 39.3* 41.2*  INR 3.96* 4.21*     Studies/Results: @RISRSLT2 @  Anti-infectives: Anti-infectives    None       Assessment/Plan  1. ilues vs PSBO, s/p right TKA  Plan:  1. Will try clamping trials today with NGT as pt able to pass some flatus and much softer.  Will try 4 hours off suction and 1 hour on suction 2. Repeat films in the AM 3. decrease amount of ice chips pt taking in with  clamping trials.  4. Lovenox ok from our standpoint once INR is less than 2, INR beginning to come down.  Is now 3.96.  LOS: 9 days    OSBORNE,KELLY E 01/19/2011  The patient is stable.  There is no evidence of peritonitis, acute abdomen, nor shock.  There is no strong evidence of failure of improvement nor decline with current non-operative management.  There is no need for surgery at the present moment.  We will continue to follow.  Ardeth Sportsman, M.D., F.A.C.S. Gastrointestinal and Minimally Invasive Surgery [facility] [address] [place], [place] [address] [phone] Main / Paging [phone] Voice Mail

## 2011-01-19 NOTE — Progress Notes (Signed)
Subjective:     Patient reports pain as 3 on 0-10 scale.  Reports "a little" flatus.  Knee doing OK.   Objective: Vital signs in last 24 hours: Temp:  [99 F (37.2 C)-99.8 F (37.7 C)] 99 F (37.2 C) (11/07 0532) Pulse Rate:  [81-92] 81  (11/07 0532) Resp:  [18-20] 20  (11/07 0532) BP: (118-125)/(69-80) 118/73 mmHg (11/07 0532) SpO2:  [95 %-96 %] 96 % (11/07 0532)  Intake/Output from previous day: 11/06 0701 - 11/07 0700 In: 1700 [I.V.:1700] Out: 2750 [Urine:250; Emesis/NG output:2500] Intake/Output this shift:     [facility] 01/17/11 0718 01/16/11 0848  HGB 11.9* 11.5*    Basename 01/17/11 0718 01/16/11 0848  WBC 11.3* 10.1  RBC 3.88* 3.52*  HCT 36.7* 33.1*  PLT 338 254    Basename 01/18/11 0643 01/17/11 0718  NA 135 134*  K 3.9 3.9  CL 99 95*  CO2 28 28  BUN 17 9  CREATININE 1.33 1.09  GLUCOSE 109* 102*  CALCIUM 8.8 9.4    Basename 01/18/11 0643 01/17/11 0718  LABPT -- --  INR 4.21* 3.44*    Neurologically intact ABD soft Sensation intact distally Dorsiflexion/Plantar flexion intact Incision: dressing C/D/I  Assessment/Plan:    Principal Problem:  *Primary osteoarthritis of left knee Active Problems:  SBO (small bowel obstruction)   Up with therapy.  SBO per surgery.  C/w TKA recovery.  DC home when able to eat, drink, and have BM.  lovenox for anticoag when ok with G-Surg, but INR still rising.  Arjen Deringer P 01/19/2011, 7:01 AM

## 2011-01-19 NOTE — Progress Notes (Signed)
Patient is setup with [facility] when medically ready. Has DME.

## 2011-01-19 NOTE — Progress Notes (Signed)
Clamping trial per order:    NG tube clamped at 1030 this am.   At 1400, pt begins to spit up small amount green emesis.  Residual checked at 1430 with 200cc obtained and air flush performed.  NGT then applied to suction.  Pt had large results from soap suds enema given at 1330.    NGT clamped again  at 1630.  Pt tolerating 30ml of cranberry juice every hour and minimal ice chips since this am.    Bowel sounds auscultated at 1800.  Abd soft, nondistended.  Continue to monitor.    Paralee Cancel, RN

## 2011-01-20 ENCOUNTER — Inpatient Hospital Stay (HOSPITAL_COMMUNITY): Payer: Medicaid Other

## 2011-01-20 LAB — PROTIME-INR: Prothrombin Time: 37.6 seconds — ABNORMAL HIGH (ref 11.6–15.2)

## 2011-01-20 MED ORDER — WHITE PETROLATUM GEL
Status: AC
  Filled 2011-01-20: qty 5

## 2011-01-20 NOTE — Progress Notes (Signed)
PT Note Pt declined PT today secondary to not feeling well. Stated that he ambulated earlier this AM. Will check on patient tomorrow morning. RN aware. Please page with any questions.   Thanks [employer], PTA

## 2011-01-20 NOTE — Progress Notes (Addendum)
Subjective:     Patient reports pain as 7 on 0-10 scale.   Controlled with PO pain meds.  Still with NGT.  Objective: Vital signs in last 24 hours: Temp:  [99.1 F (37.3 C)-100.2 F (37.9 C)] 99.2 F (37.3 C) (11/08 0545) Pulse Rate:  [89-96] 89  (11/08 0545) Resp:  [18-20] 18  (11/08 0545) BP: (117-137)/(72-82) 128/82 mmHg (11/08 0545) SpO2:  [95 %-97 %] 95 % (11/08 0545)  Knee wounds well approximated, dressing changed, but large effusion/hemarthrosis. Intake/Output from previous day: 11/07 0701 - 11/08 0700 In: 3355.4 [P.O.:150; I.V.:2935.4] Out: 3650 [Urine:200; Emesis/NG output:3450] Intake/Output this shift:     [facility] 01/19/11 0650  HGB 11.6*    Basename 01/19/11 0650  WBC 8.1  RBC 3.59*  HCT 34.0*  PLT 438*    Basename 01/19/11 0650 01/18/11 0643  NA 136 135  K 4.2 3.9  CL 101 99  CO2 25 28  BUN 13 17  CREATININE 1.07 1.33  GLUCOSE 103* 109*  CALCIUM 9.1 8.8    Basename 01/20/11 0611 01/19/11 0650  LABPT -- --  INR 3.75* 3.96*      Assessment/Plan:    s/p L TKA, with SBO. Up with therapy Continue Plan per [facility] for SBO, they are planning on clamping the NGT. Continue SCD Pt requests SNF placement after DC.  Hemarthrosis, secondary to prolonged elevated INR.  This appears to be normalizing, but slowly.  May consider reversal with IV vit K if hemarthrosis becomes problematic and worsening.  Will also defer to g-surg regarding IV vit K given his complex GI situation.   (patient interviewed via telephone JPL).   Pt was examined By Julien Girt PA-C and report given directly to Teryl Lucy, MD.

## 2011-01-20 NOTE — Progress Notes (Addendum)
Subjective: On going high output thru NG.  Pt had flatus and BM yesterday after soap suds enema yesterday.  Objective: Vital signs in last 24 hours: Temp:  [99.1 F (37.3 C)-100.2 F (37.9 C)] 99.2 F (37.3 C) 2023-02-18 0545) Pulse Rate:  [89-96] 89  02-18-2023 0545) Resp:  [18-20] 18  02/18/2023 0545) BP: (117-137)/(72-82) 128/82 mmHg Feb 18, 2023 0545) SpO2:  [95 %-97 %] 95 % 02/18/2023 0545) Last BM Date: 01/19/11  Intake/Output from previous day: 11/07 0701 - 02/18/2023 0700 In: 3355.4 [P.O.:150; I.V.:2935.4] Out: 3650 [Urine:200; Emesis/NG output:3450] Intake/Output this shift:    General appearance: alert, cooperative and no distress Eyes: conjunctivae/corneas clear. PERRL, EOM's intact. Fundi benign. Nose: Nares normal. Septum midline. Mucosa normal. No drainage or sinus tenderness., ng in place. Resp: clear to auscultation bilaterally GI: soft, non-tender; mild/moderate distention, no masses,  no organomegaly  Lab Results:   [facility] 01/19/11 0650  WBC 8.1  HGB 11.6*  HCT 34.0*  PLT 438*    BMET  Basename 01/19/11 0650 01/18/11 0643  NA 136 135  K 4.2 3.9  CL 101 99  CO2 25 28  GLUCOSE 103* 109*  BUN 13 17  CREATININE 1.07 1.33  CALCIUM 9.1 8.8   PT/INR  Basename 02-18-11 0611 01/19/11 0650  LABPROT 37.6* 39.3*  INR 3.75* 3.96*     Studies/Results: Dg Abd 2 Views  2011-02-18  *RADIOLOGY REPORT*  Clinical Data: Evaluate ileus versus obstruction.  ABDOMEN - 2 VIEW  Comparison: 01/18/2011  Findings: Dilated small bowel loops are again noted, slightly progressed since prior study, most compatible with small bowel obstruction.  NG tube is now present in the stomach.  No free air organomegaly.  No suspicious calcification.  Degenerative changes in the lumbar spine and hips.  IMPRESSION: Mildly worsened partial small bowel obstruction pattern.  NG tube in the stomach.  Original Report Authenticated By: [name] [name], M.D.    Anti-infectives: Anti-infectives    None      Current Facility-Administered Medications  Medication Dose Route Frequency Provider Last Rate Last Dose  . acetaminophen (TYLENOL) suppository 325-650 mg  325-650 mg Rectal Q4H PRN [name] [name], PHARMD      . bisacodyl (DULCOLAX) suppository 10 mg  10 mg Rectal Daily PRN [name] [name], PHARMD      . dextrose 5 % and 0.45 % NaCl with KCl 20 mEq/L infusion   Intravenous Continuous [name] [name], PHARMD 125 mL/hr at 02/18/2011 2952    . HYDROmorphone (DILAUDID) injection 0.5 mg  0.5 mg Intravenous Q3H PRN [name] [name]   0.5 mg at 02-18-11 8413  . methocarbamol (ROBAXIN) 500 mg in dextrose 5 % 50 mL IVPB  500 mg Intravenous Q6H PRN [name] [name], PHARMD      . metoCLOPramide (REGLAN) injection 10 mg  10 mg Intravenous Q6H [name] [name]   10 mg at 18-Feb-2011 2440  . metoprolol (LOPRESSOR) injection 2.5 mg  2.5 mg Intravenous Q12H [name] [name], PHARMD   2.5 mg at 01/19/11 2315  . ondansetron (ZOFRAN) injection 4 mg  4 mg Intravenous Q6H PRN [name] [name], PHARMD   4 mg at 01/17/11 1211  . oxyCODONE-acetaminophen (PERCOCET) 5-325 MG per tablet 1-2 tablet  1-2 tablet Oral Q4H PRN [name] [name]      . phenol (CHLORASEPTIC) mouth spray 1 spray  1 spray Mouth/Throat PRN [name] [name], PHARMD      . promethazine (PHENERGAN) injection 12.5 mg  12.5 mg Intravenous  Q6H PRN [name] [name], PHARMD   12.5 mg at 01/17/11 1812  . sodium phosphate (FLEET) 7-19 GM/118ML enema 1 enema  1 enema Rectal Daily PRN [name] [name], PHARMD        Assessment/Plan:     1.Ileus post op vs sbo. 2. S/P R knee replacement. Plan:  Continue NPO  Ex ice chips for now.  Labs AM.  Review with DR. Jabori [name].   LOS: 10 days    [name],[name] 01/20/2011  ATTENDING ADDENDUM:  I personally reviewed patient's record, examined patient, and formulated the following plan:  Small BM w enema.   No major flatus Xrays more concerning for SBO.  -Prob will need lap vs open LOA if does not improve by tomorrow.   Limiting factor is pt still with coagulopathy w elevated INR despite holding warfarin & giving vitK.  The patient is stable.  There is no evidence of peritonitis, acute abdomen, nor shock.  There is no mandate for surgery at the present moment.  OR not safe for now.  If not better, OR tomorrow w FFP  -start TNA for malnutrition

## 2011-01-20 NOTE — Progress Notes (Signed)
Utilization review completed. Samanthia Howland, RN, BSN. 01/20/11 

## 2011-01-21 ENCOUNTER — Encounter (HOSPITAL_COMMUNITY): Payer: Self-pay | Admitting: Anesthesiology

## 2011-01-21 ENCOUNTER — Inpatient Hospital Stay (HOSPITAL_COMMUNITY): Payer: Medicaid Other

## 2011-01-21 ENCOUNTER — Encounter (HOSPITAL_COMMUNITY)
Admission: RE | Disposition: A | Discharge status: Skilled Nursing Facility | Payer: Self-pay | Source: Ambulatory Visit | Attending: Orthopedic Surgery

## 2011-01-21 ENCOUNTER — Other Ambulatory Visit: Payer: Self-pay

## 2011-01-21 DIAGNOSIS — D68318 Other hemorrhagic disorder due to intrinsic circulating anticoagulants, antibodies, or inhibitors: Secondary | ICD-10-CM

## 2011-01-21 LAB — PROTIME-INR
INR: 3.28 — ABNORMAL HIGH (ref 0.00–1.49)
INR: 1.37 (ref 0.00–1.49)
Prothrombin Time: 33.9 s — ABNORMAL HIGH (ref 11.6–15.2)
Prothrombin Time: 17.1 seconds — ABNORMAL HIGH (ref 11.6–15.2)

## 2011-01-21 LAB — CBC
HCT: 34.7 % — ABNORMAL LOW (ref 39.0–52.0)
Hemoglobin: 12 g/dL — ABNORMAL LOW (ref 13.0–17.0)
MCV: 94 fL (ref 78.0–100.0)
WBC: 17.6 10*3/uL — ABNORMAL HIGH (ref 4.0–10.5)

## 2011-01-21 LAB — URINALYSIS, ROUTINE W REFLEX MICROSCOPIC
Ketones, ur: NEGATIVE mg/dL
Nitrite: POSITIVE — AB
Protein, ur: 100 mg/dL — AB
Urobilinogen, UA: 1 mg/dL (ref 0.0–1.0)

## 2011-01-21 LAB — COMPREHENSIVE METABOLIC PANEL
ALT: 42 U/L (ref 0–53)
AST: 52 U/L — ABNORMAL HIGH (ref 0–37)
Alkaline Phosphatase: 64 U/L (ref 39–117)
CO2: 29 mEq/L (ref 19–32)
Chloride: 96 mEq/L (ref 96–112)
GFR calc Af Amer: 82 mL/min — ABNORMAL LOW (ref >90)
GFR calc non Af Amer: 71 mL/min — ABNORMAL LOW (ref >90)
Glucose, Bld: 122 mg/dL — ABNORMAL HIGH (ref 70–99)
Potassium: 4.2 mEq/L (ref 3.5–5.1)
Sodium: 134 mEq/L — ABNORMAL LOW (ref 135–145)
Total Bilirubin: 1.9 mg/dL — ABNORMAL HIGH (ref 0.3–1.2)

## 2011-01-21 LAB — MAGNESIUM: Magnesium: 2.5 mg/dL (ref 1.5–2.5)

## 2011-01-21 LAB — TRIGLYCERIDES: Triglycerides: 104 mg/dL (ref <150)

## 2011-01-21 LAB — URINE MICROSCOPIC-ADD ON

## 2011-01-21 LAB — SYNOVIAL CELL COUNT + DIFF, W/ CRYSTALS
Lymphocytes-Synovial Fld: 1 % (ref 0–20)
Monocyte-Macrophage-Synovial Fluid: 1 % — ABNORMAL LOW (ref 50–90)
Neutrophil, Synovial: 98 % — ABNORMAL HIGH (ref 0–25)
WBC, Synovial: 7350 /mm3 — ABNORMAL HIGH (ref 0–200)

## 2011-01-21 LAB — TYPE AND SCREEN: Antibody Screen: NEGATIVE

## 2011-01-21 LAB — PHOSPHORUS: Phosphorus: 3.7 mg/dL (ref 2.3–4.6)

## 2011-01-21 SURGERY — LAPAROSCOPY, DIAGNOSTIC
Anesthesia: General

## 2011-01-21 MED ORDER — VITAMIN K1 10 MG/ML IJ SOLN
10.0000 mg | Freq: Once | INTRAVENOUS | Status: AC
  Administered 2011-01-21: 10 mg via INTRAVENOUS
  Filled 2011-01-21: qty 1

## 2011-01-21 MED ORDER — FAT EMULSION 20 % IV EMUL
250.0000 mL | INTRAVENOUS | Status: AC
  Administered 2011-01-21: 250 mL via INTRAVENOUS
  Filled 2011-01-21: qty 250

## 2011-01-21 MED ORDER — PIPERACILLIN-TAZOBACTAM 3.375 G IVPB
3.3750 g | Freq: Three times a day (TID) | INTRAVENOUS | Status: DC
  Administered 2011-01-21 – 2011-01-22 (×3): 3.375 g via INTRAVENOUS
  Filled 2011-01-21 (×4): qty 50

## 2011-01-21 MED ORDER — SODIUM CHLORIDE 0.9 % IJ SOLN
10.0000 mL | Freq: Two times a day (BID) | INTRAMUSCULAR | Status: DC
  Administered 2011-01-23 – 2011-01-24 (×3): 10 mL

## 2011-01-21 MED ORDER — VITAMIN K1 10 MG/ML IJ SOLN
10.0000 mg | Freq: Every day | INTRAMUSCULAR | Status: AC
  Administered 2011-01-22 – 2011-01-23 (×2): 10 mg via SUBCUTANEOUS
  Filled 2011-01-21 (×2): qty 1

## 2011-01-21 MED ORDER — HEPARIN SODIUM (PORCINE) 5000 UNIT/ML IJ SOLN
5000.0000 [IU] | Freq: Once | INTRAMUSCULAR | Status: DC
  Filled 2011-01-21: qty 1

## 2011-01-21 MED ORDER — BUPIVACAINE-EPINEPHRINE PF 0.5-1:200000 % IJ SOLN
30.0000 mL | Freq: Once | INTRAMUSCULAR | Status: DC
  Filled 2011-01-21: qty 30

## 2011-01-21 MED ORDER — LIDOCAINE HCL 1 % IJ SOLN
5.0000 mL | Freq: Once | INTRAMUSCULAR | Status: DC
  Filled 2011-01-21: qty 5

## 2011-01-21 MED ORDER — SODIUM CHLORIDE 0.9 % IJ SOLN
10.0000 mL | INTRAMUSCULAR | Status: DC | PRN
  Administered 2011-01-21 – 2011-01-26 (×4): 10 mL

## 2011-01-21 MED ORDER — KCL IN DEXTROSE-NACL 20-5-0.45 MEQ/L-%-% IV SOLN
INTRAVENOUS | Status: AC
  Administered 2011-01-21: 18:00:00 via INTRAVENOUS
  Filled 2011-01-21: qty 1000

## 2011-01-21 MED ORDER — INSULIN ASPART 100 UNIT/ML ~~LOC~~ SOLN
0.0000 [IU] | Freq: Four times a day (QID) | SUBCUTANEOUS | Status: DC
  Administered 2011-01-22 – 2011-01-23 (×2): 2 [IU] via SUBCUTANEOUS
  Filled 2011-01-21: qty 3

## 2011-01-21 MED ORDER — TRACE MINERALS CR-CU-MN-SE-ZN 10-1000-500-60 MCG/ML IV SOLN
INTRAVENOUS | Status: AC
  Administered 2011-01-21: 18:00:00 via INTRAVENOUS
  Filled 2011-01-21: qty 1000

## 2011-01-21 NOTE — Progress Notes (Addendum)
INITIAL ADULT NUTRITION ASSESSMENT Date: 01/21/2011   Time: 11:04 AM  Reason for Assessment: New TNA  ASSESSMENT:  60 year old Male  Dx: Primary osteoarthritis of left knee  Hx: CAD, ischemic cardiomyopathy, hypertension, history of hepatitis C, history of colon cancer, and nephrolithiasis  Related Meds: lidocaine, metoprolol, vit K, reglan  Ht: 5\' 11"  (180.3 cm) (Entered for Cutover) from 10/30; unable to obtain new wt at this time per nurse tech  Wt: 204 lb 9.4 oz (92.8 kg) (Entered for Cutover)  Ideal Wt: 78.2 kg       % Ideal Wt: 119%  Usual Wt: 203# (92.3kg)      % Usual Wt: 99%  Body mass index is 28.53 kg/(m^2).  Food/Nutrition Related Hx: Regular diet PTA  Labs on 11/9: Magnesium 2.5 mg/dL      Phosphorus 3.7 mg/dL      Sodium 161 mEq/L L      Potassium 4.2 mEq/L       Chloride 96 mEq/L      CO2 29 mEq/L       Glucose, Bld 122 mg/dL H      BUN 13 mg/dL       Creatinine, Ser 0.96 mg/dL       Calcium 9.6 mg/dL       Total Protein 8.2 g/dL       Albumin 2.8 g/dL L      AST 52 U/L H      ALT 42 U/L       Alkaline Phosphatase 64 U/L       Total Bilirubin 1.9 mg/dL H  I/O last 3 completed shifts: In: 5155.4 [P.O.:720; I.V.:4435.4] Out: 6075 [Urine:1000; Emesis/NG output:5075] Total I/O In: 62.5 [I.V.:50; Blood:12.5] Out: -   Diet Order: NPO with ice chips starting 11/7 (previously on low sodium heart healthy diet)  Supplements/Tube Feeding: none  IVF:    dextrose 5 % and 0.45 % NaCl with KCl 20 mEq/L Last Rate: 125 mL/hr at 01/21/11 0700   Estimated Nutritional Needs:   Kcal: 2000-2200 kcal/d Protein: 95-110 g  Fluid: 2 - 2.2 L/day  R total knee arthroplasty performed 10/29. Pt developed abdominal distention with nausea and vomiting. NG tube placed for gastric decompression. Patient was placed on bowel rest for post-op ileus. NG tube pulled out and patient experienced intermittent vomiting on 11/4 night. Patient refused NG tube replacement. Noted  increased nausea and vomiting. NG tube replaced on 11/5. Patient able to tolerate cranberry juice with NG tube clamp trials on 11/7. Remains with high NG tube output. Output yesterday 3075 ml via NG tube. Per MD, patient with no improvement, no BM or flatus. Patient to initiate TNA, surgery planned for abdominal exploration. Pt reports tolerating ice chips. Suspect wt loss 2/2 prolonged period of suboptimal oral intake. Pt denies wt change or changes in appetite PTA; suspect well-nourished PTA.   NUTRITION DIAGNOSIS: -Inadequate oral intake (NI-2.1).  Status: Ongoing  RELATED TO: inability to eat  AS EVIDENCE BY: NPO status.  MONITORING/EVALUATION(Goals): Goal: TPN to meet >90% estimated needs. Advance to diet as tolerated per MD. Monitor: Weights, labs, TNA adequacy, advancement to diet  EDUCATION NEEDS: -No education needs identified at this time  INTERVENTION: 1. TPN per pharmacy 2. Weight patient as able  Dietitian #:[phone]  DOCUMENTATION CODES Per approved criteria  -Not Applicable    Adair Laundry 01/21/2011, 11:04 AM

## 2011-01-21 NOTE — Progress Notes (Signed)
  PATIENT ID: [name] [name]  MRN: [id number]  DOB/AGE:  [birth year] / 58 y.o.    Procedure(s) (LRB): LAPAROSCOPY DIAGNOSTIC (N/A) EXPLORATORY LAPAROTOMY (N/A)    PROGRESS NOTE Subjective: Patient is alert, oriented, no Nausea, no Vomiting, no passing gas, no Bowel Movement. NPO....ng tube in place lots of drainage. Denies SOB, Chest or Calf Pain. Using Incentive Spirometer, PAS in place. Ambulation limited to increased pain in knee. Patient reports pain as 8 on 0-10 scale  .    Objective: Vital signs in last 24 hours: Filed Vitals:   01/21/11 1015 01/21/11 1100 01/21/11 1210 01/21/11 1400  BP: 144/81 140/78 140/80 155/79  Pulse: 89 79 88 92  Temp: 99 F (37.2 C) 99 F (37.2 C) 98.3 F (36.8 C) 98.9 F (37.2 C)  TempSrc: Oral Oral Oral Oral  Resp: 18 18 20 18   Height:      Weight:      SpO2:          Intake/Output from previous day: I/O last 3 completed shifts: In: 5155.4 [P.O.:720; I.V.:4435.4] Out: 6075 [Urine:1000; Emesis/NG output:5075]   Intake/Output this shift: Total I/O In: 848.5 [I.V.:250; Blood:598.5] Out: -    LABORATORY DATA:  Basename 01/21/11 0553 01/20/11 0611 01/19/11 0650  WBC 17.6* -- 8.1  HGB 12.0* -- 11.6*  HCT 34.7* -- 34.0*  PLT 556* -- 438*  NA 134* -- 136  K 4.2 -- 4.2  CL 96 -- 101  CO2 29 -- 25  BUN 13 -- 13  CREATININE 1.12 -- 1.07  GLUCOSE 122* -- 103*  INR 3.28* 3.75* --  CALCIUM 9.6 -- --    Examination: Neurologically intact ABD soft Intact pulses distally Dorsiflexion/Plantar flexion intact Incision: scant drainage No cellulitis present Compartment soft 2+effusion in knee.  looks smaller than last night}  Assessment:     Procedure(s) (LRB): LAPAROSCOPY DIAGNOSTIC (N/A) EXPLORATORY LAPAROTOMY (N/A) ADDITIONAL DIAGNOSIS:  Acute Blood Loss Anemia and s/p right total knee, small bowel obstruction, history of colon cancer hypertension, hepatitis C, coronary artery   Plan: PT/OT WBAT,  Under sterile conditons pt's  right knee was aspirated of 5 cc of serosanquinous fluid.  Does not appear infected. Pt is going to the OR with general surgery today for his SBO.  Will need pre and post operative antibiotics. Will follow  DVT Prophylaxis: coumadin supratherapeutic....given Vit K and FFP by general surgery in anticipation of surgery.

## 2011-01-21 NOTE — Progress Notes (Signed)
ANTIBIOTIC CONSULT NOTE - INITIAL  Pharmacy Consult for zosyn  Indication: s/p surgery for SBO  Allergies  Allergen Reactions  . Sulfonamide Derivatives Rash and Other (See Comments)    REACTION: penile irritation    Patient Measurements: Height: 5\' 11"  (180.3 cm) (Entered for Cutover) Weight: 204 lb 9.4 oz (92.8 kg) (Entered for Cutover) IBW/kg (Calculated) : 75.3  Adjusted Body Weight:   Vital Signs: Temp: 98.9 F (37.2 C) (11/09 1400) Temp src: Oral (11/09 1400) BP: 155/79 mmHg (11/09 1400) Pulse Rate: 92  (11/09 1400) Intake/Output from previous day: 11/08 0701 - 11/09 0700 In: 3570 [P.O.:570; I.V.:3000] Out: 4075 [Urine:1000; Emesis/NG output:3075] Intake/Output from this shift: Total I/O In: 848.5 [I.V.:250; Blood:598.5] Out: -   Labs:  Basename 01/21/11 0553 01/19/11 0650  WBC 17.6* 8.1  HGB 12.0* 11.6*  PLT 556* 438*  LABCREA -- --  CREATININE 1.12 1.07   Estimated Creatinine Clearance: 83.7 ml/min (by C-G formula based on Cr of 1.12). No results found for this basename: VANCOTROUGH:2,VANCOPEAK:2,VANCORANDOM:2,GENTTROUGH:2,GENTPEAK:2,GENTRANDOM:2,TOBRATROUGH:2,TOBRAPEAK:2,TOBRARND:2,AMIKACINPEAK:2,AMIKACINTROU:2,AMIKACIN:2, in the last 72 hours   Microbiology: Recent Results (from the past 720 hour(s))  SURGICAL PCR SCREEN     Status: Normal   Collection Time   01/05/11 10:47 AM      Component Value Range Status Comment   MRSA, PCR NEGATIVE  NEGATIVE  Final    Staphylococcus aureus NEGATIVE  NEGATIVE  Final     Medical History: No past medical history on file.  Medications:  Scheduled:    . Bupivacaine-Epinephrine PF  30 mL Infiltration Once  . heparin  5,000 Units Subcutaneous Once  . insulin aspart  0-9 Units Subcutaneous Q6H  . lidocaine  5 mL Infiltration Once  . metoCLOPramide (REGLAN) injection  10 mg Intravenous Q6H  . metoprolol  2.5 mg Intravenous Q12H  . phytonadione (VITAMIN K) IV  10 mg Intravenous Once  . phytonadione  10 mg  Subcutaneous Daily  . sodium chloride  10 mL Intracatheter Q12H   Assessment: 60 year male s/p surgery for SBO will be started on zosyn therapy. Renal fxn ok.   Goal of Therapy:    Plan:  1) Zosyn 3.375g iv q8h (4hr infusion) 2) follow up LOT of abx  Bryker Fletchall, Tsz-Yin 01/21/2011,4:08 PM

## 2011-01-21 NOTE — Progress Notes (Signed)
Spoke with Bev, RN. Pt session cancelled today secondary to patient going to OR. Will re-eval pt after surgery as appropriate. Please call with any questions.  01/21/2011 Fredrich Birks PTA [phone] pager [phone] office
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
## 2011-01-21 NOTE — Progress Notes (Signed)
OT Cancellation Note  Treatment cancelled today due to patient is going to OR.  Will re-eval after surgery as appropriate. Please call with any questions.   01/21/2011 Cipriano Mile OTR/L Pager [phone] Office [phone]

## 2011-01-21 NOTE — Progress Notes (Signed)
PARENTERAL NUTRITION CONSULT NOTE - INITIAL  Pharmacy Consult for tna Indication: post op ileus with large NG output  Allergies  Allergen Reactions  . Sulfonamide Derivatives Rash and Other (See Comments)    REACTION: penile irritation    Patient Measurements: Height: 5\' 11"  (180.3 cm) (Entered for Cutover) Weight: 204 lb 9.4 oz (92.8 kg) (Entered for Cutover) IBW/kg (Calculated) : 75.3  Adjusted Body Weight: 114  Usual Weight: 204 kg Vital Signs: Temp: 99 F (37.2 C) (11/09 1100) Temp src: Oral (11/09 1100) BP: 140/78 mmHg (11/09 1100) Pulse Rate: 79  (11/09 1100) Intake/Output from previous day: 11/08 0701 - 11/09 0700 In: 3570 [P.O.:570; I.V.:3000] Out: 4075 [Urine:1000; Emesis/NG output:3075] Intake/Output from this shift: Total I/O In: 353.5 [I.V.:50; Blood:303.5] Out: -   Labs:  Basename 01/21/11 0553 01/20/11 0611 01/19/11 0650  WBC 17.6* -- 8.1  HGB 12.0* -- 11.6*  HCT 34.7* -- 34.0*  PLT 556* -- 438*  APTT -- -- --  INR 3.28* 3.75* 3.96*     Basename 01/21/11 0553 01/19/11 0650  NA 134* 136  K 4.2 4.2  CL 96 101  CO2 29 25  GLUCOSE 122* 103*  BUN 13 13  CREATININE 1.12 1.07  LABCREA -- --  CREAT24HRUR -- --  CALCIUM 9.6 9.1  MG 2.5 --  PHOS 3.7 --  PROT 8.2 7.3  ALBUMIN 2.8* 2.6*  AST 52* 47*  ALT 42 36  ALKPHOS 64 56  BILITOT 1.9* 1.8*  BILIDIR -- --  IBILI -- --  PREALBUMIN -- --  CHOLHDL -- --  CHOL -- --   Estimated Creatinine Clearance: 83.7 ml/min (by C-G formula based on Cr of 1.12).   No results found for this basename: GLUCAP:3 in the last 72 hours  Medical History:CAD, ischemic cardiomyopathy, hypertension, history of hepatitis C, history of colon cancer, and nephrolithiasis   Medications:  Scheduled:    . Bupivacaine-Epinephrine PF  30 mL Infiltration Once  . insulin aspart  0-9 Units Subcutaneous Q6H  . lidocaine  5 mL Infiltration Once  . metoCLOPramide (REGLAN) injection  10 mg Intravenous Q6H  . metoprolol  2.5  mg Intravenous Q12H  . phytonadione (VITAMIN K) IV  10 mg Intravenous Once  . phytonadione  10 mg Subcutaneous Daily  . sodium chloride  10 mL Intracatheter Q12H    Insulin Requirements in the past 24 hours:  None   Current Nutrition:  NPO, regular diet PTA  Assessment: Post op ileus, for exp lap once coags reversed  Nutritional Goals: 2000-2200 kCal, 95-110 grams of protein per day  Plan:  Start tpn at 42 ml/hr w/ fat at 10 ml/hr; decrease IVF from 125 to 31ml/hr when tpn started; will order empiric SSI. Goal rate of 83 ml/hr  Will provide ~100gm protein and 1894 kcals  Len Childs T 01/21/2011,12:04 PM

## 2011-01-22 ENCOUNTER — Inpatient Hospital Stay (HOSPITAL_COMMUNITY): Payer: Medicaid Other

## 2011-01-22 ENCOUNTER — Other Ambulatory Visit: Payer: Self-pay | Admitting: Physician Assistant

## 2011-01-22 ENCOUNTER — Encounter (HOSPITAL_COMMUNITY)
Admission: RE | Disposition: A | Discharge status: Skilled Nursing Facility | Payer: Self-pay | Source: Ambulatory Visit | Attending: Orthopedic Surgery

## 2011-01-22 DIAGNOSIS — N390 Urinary tract infection, site not specified: Secondary | ICD-10-CM

## 2011-01-22 LAB — COMPREHENSIVE METABOLIC PANEL
Alkaline Phosphatase: 64 U/L (ref 39–117)
BUN: 17 mg/dL (ref 6–23)
Calcium: 9.5 mg/dL (ref 8.4–10.5)
GFR calc Af Amer: 73 mL/min — ABNORMAL LOW (ref >90)
Glucose, Bld: 123 mg/dL — ABNORMAL HIGH (ref 70–99)
Total Protein: 8.1 g/dL (ref 6.0–8.3)

## 2011-01-22 LAB — GLUCOSE, CAPILLARY
Glucose-Capillary: 128 mg/dL — ABNORMAL HIGH (ref 70–99)
Glucose-Capillary: 122 mg/dL — ABNORMAL HIGH (ref 70–99)
Glucose-Capillary: 119 mg/dL — ABNORMAL HIGH (ref 70–99)

## 2011-01-22 LAB — URINALYSIS, ROUTINE W REFLEX MICROSCOPIC
Glucose, UA: NEGATIVE mg/dL
Ketones, ur: NEGATIVE mg/dL
Specific Gravity, Urine: 1.02 (ref 1.005–1.030)
pH: 7.5 (ref 5.0–8.0)

## 2011-01-22 LAB — PREPARE FRESH FROZEN PLASMA: Unit division: 0

## 2011-01-22 LAB — DIFFERENTIAL
Basophils Absolute: 0 10*3/uL (ref 0.0–0.1)
Basophils Relative: 0 % (ref 0–1)
Eosinophils Relative: 1 % (ref 0–5)
Lymphocytes Relative: 12 % (ref 12–46)

## 2011-01-22 LAB — CBC
MCHC: 32.2 g/dL (ref 30.0–36.0)
MCV: 95.1 fL (ref 78.0–100.0)
Platelets: 503 10*3/uL — ABNORMAL HIGH (ref 150–400)
RDW: 13.6 % (ref 11.5–15.5)
WBC: 14.2 10*3/uL — ABNORMAL HIGH (ref 4.0–10.5)

## 2011-01-22 LAB — PREALBUMIN: Prealbumin: 9 mg/dL — ABNORMAL LOW (ref 17.0–34.0)

## 2011-01-22 LAB — PHOSPHORUS: Phosphorus: 3.4 mg/dL (ref 2.3–4.6)

## 2011-01-22 LAB — URINE MICROSCOPIC-ADD ON

## 2011-01-22 SURGERY — EXCISION, SMALL INTESTINE
Anesthesia: General

## 2011-01-22 MED ORDER — CIPROFLOXACIN IN D5W 400 MG/200ML IV SOLN
400.0000 mg | Freq: Two times a day (BID) | INTRAVENOUS | Status: DC
  Administered 2011-01-22 – 2011-01-25 (×7): 400 mg via INTRAVENOUS
  Filled 2011-01-22 (×7): qty 200

## 2011-01-22 MED ORDER — DEXTROSE-NACL 5-0.45 % IV SOLN
INTRAVENOUS | Status: DC
  Administered 2011-01-22 – 2011-01-23 (×2): via INTRAVENOUS
  Administered 2011-01-25 – 2011-01-26 (×2): 20 mL/h via INTRAVENOUS

## 2011-01-22 MED ORDER — LISINOPRIL 20 MG PO TABS
20.0000 mg | ORAL_TABLET | Freq: Every day | ORAL | Status: DC
  Administered 2011-01-22 – 2011-01-26 (×5): 20 mg via ORAL
  Filled 2011-01-22 (×6): qty 1

## 2011-01-22 MED ORDER — CLINIMIX E/DEXTROSE (5/15) 5 % IV SOLN
INTRAVENOUS | Status: AC
  Administered 2011-01-22: 18:00:00 via INTRAVENOUS
  Filled 2011-01-22: qty 2000

## 2011-01-22 MED ORDER — FAT EMULSION 20 % IV EMUL
250.0000 mL | INTRAVENOUS | Status: AC
  Administered 2011-01-22: 250 mL via INTRAVENOUS
  Filled 2011-01-22: qty 250

## 2011-01-22 NOTE — Progress Notes (Signed)
PT/OT/SLP Cancellation Note   Treatment cancelled today due to patient receiving procedure or test (in xray). Will reattempt today as time allows vs follow up tomorrow.   Sallyanne Kuster, PTA

## 2011-01-22 NOTE — Progress Notes (Addendum)
PARENTERAL NUTRITION CONSULT NOTE   Pharmacy Consult for tna Indication: post op ileus with large NG output, OR 01/21/11 cancelled, schedule for OR today  Allergies  Allergen Reactions  . Sulfonamide Derivatives Rash and Other (See Comments)    REACTION: penile irritation   Intake/Output from previous day: 11/09 0701 - 11/10 0700 In: 2348.3 [P.O.:240; I.V.:736.3; Blood:598.5; IV Piggyback:100; TPN:673.6] Out: 2000 [Emesis/NG output:2000]     Labs:  [facility] 01/22/11 0500 01/21/11 1520 01/21/11 0553  WBC 14.2* -- 17.6*  HGB 10.0* -- 12.0*  HCT 31.1* -- 34.7*  PLT 503* -- 556*  APTT -- 38* --  INR 1.20 1.37 3.28*     Basename 01/22/11 0500 01/21/11 0553  NA 134* 134*  K 3.7 4.2  CL 94* 96  CO2 29 29  GLUCOSE 123* 122*  BUN 17 13  CREATININE 1.23 1.12  LABCREA -- --  CREAT24HRUR -- --  CALCIUM 9.5 9.6  MG 2.4 2.5  PHOS 3.4 3.7  PROT 8.1 8.2  ALBUMIN 2.8* 2.8*  AST 59* 52*  ALT 47 42  ALKPHOS 64 64  BILITOT 2.6* 1.9*  BILIDIR -- --  IBILI -- --  PREALBUMIN -- 6.4*  CHOLHDL -- --  CHOL 147 --   Estimated Creatinine Clearance: 76.2 ml/min (by C-G formula based on Cr of 1.23).    Basename 01/22/11 0603 01/22/11 0030  GLUCAP 128* 157*    Insulin Requirements in the past 24 hours:  2 units ssi   Current Nutrition:  tna at 42 ml/hr well tolerated  Assessment: Tolerated initiation of tna  Nutritional Goals: 2000-2200 kCal, 95-110 grams of protein per day  Plan:  increase tpn to 83 ml/hr w/ fat at 10 ml/hr; decrease IVF from 62ml/hr to kvo  when tpn rate increased. Goal rate of 83 ml/hr provides ~100gm protein and 1894 kcals  Ferman Basilio T 01/22/2011,9:10 AM

## 2011-01-22 NOTE — Progress Notes (Addendum)
Subjective:   Procedure(s) (LRB): LAPAROSCOPY DIAGNOSTIC (N/A) EXPLORATORY LAPAROTOMY (N/A) Patient reports pain as 4 on 0-10 scale.   No new complaints to day States belly feels good No significant knee pain Denies cp, no sob, no n/v/d  Objective: Current Vitals Blood pressure 161/93, pulse 87, temperature 98.5 F (36.9 C), temperature source Oral, resp. rate 18, height 5\' 11"  (1.803 m), weight 92.8 kg (204 lb 9.4 oz), SpO2 97.00%. Vital signs in last 24 hours: Temp:  [98.3 F (36.8 C)-99.6 F (37.6 C)] 98.5 F (36.9 C) (11/10 0616) Pulse Rate:  [79-101] 87  (11/10 0616) Resp:  [18-20] 18  (11/10 0616) BP: (115-161)/(78-93) 161/93 mmHg (11/10 0616) SpO2:  [97 %] 97 % (11/10 0616)  Intake/Output from previous day: 11/09 0701 - 11/10 0700 In: 2348.3 [P.O.:240; I.V.:736.3; Blood:598.5; IV Piggyback:100; TPN:673.6] Out: 2000 [Emesis/NG output:2000]  LABS  Basename 01/22/11 0500 01/21/11 0553  HGB 10.0* 12.0*    Basename 01/22/11 0500 01/21/11 0553  WBC 14.2* 17.6*  RBC 3.27* 3.69*  HCT 31.1* 34.7*  PLT 503* 556*    Basename 01/22/11 0500 01/21/11 0553  NA 134* 134*  K 3.7 4.2  CL 94* 96  CO2 29 29  BUN 17 13  CREATININE 1.23 1.12  GLUCOSE 123* 122*  CALCIUM 9.5 9.6    Basename 01/22/11 0500 01/21/11 1520  LABPT -- --  INR 1.20 1.37   Synovial fluid analysis R knee  Gram + cocci in pairs  WBC count: 7350   PMN: 98  Crystals: none  U/A:  + nitrite  + LE  Few Squamous epithelial  Physical Exam  Gen: NAD, + NGT Lungs: Clear B  Cardiac: Reg Abd: + BS, NT Ext: R LEx  Incision line stable, looks very good, no purulence or drainage for that matter  Does not appear to demonstrate evidence of acute infection  Knee is warm, no obvioius erythema  No pain with Passive ranging of knee  Mild knee effusion  Motor and sensory functions intact distally  Ext warm, + DP pulse  No DCT  Compartments soft and nontender    Imaging Dg Chest 2  View  01/21/2011  *RADIOLOGY REPORT*  Clinical Data: Leukocytosis.  Shortness of breath.  CHEST - 2 VIEW  Comparison: 01/05/2011  Findings: Left-sided PICC line tip is in the superior vena cava in good position.  NG tube is in the stomach.  The heart size and vascularity are normal.  Minimal atelectasis at the right lung base.  No infiltrates or effusions.  IMPRESSION: Minimal atelectasis at the right lung base.  Original Report Authenticated By: Gwynn Burly, M.D.    Assessment/Plan:   Procedure(s) (LRB): LAPAROSCOPY DIAGNOSTIC (N/A) EXPLORATORY LAPAROTOMY (N/A)  60 y/o male s/p R TKA 01/14/2011 with complicated hospital course  1. R TKA POD 8  Aspiration performed yesterday and shows gram positive cocci in pairs  Cell count not consistent with septic joint  PE not consistent septic joint either, but cannot ignore + gram stain  Will continue Zosyn but will not add any other agents   Zosyn was started yesterday and patient appears to be responding as WBC is decreased   CCS plans to switch to Cipro based on U/A  Possible that the specimen was contaminated. May wait for final cultures to come back before proceeding to   OR for washout and poly exchange if necessary  Continue to monitor, pt does not appear septic or acutely ill.    Believe it is prudent at  this point and in the patients best interest to monitor and follow as re-operation presents a whole new set of risks  Check CRP and ESR   2. GI   Per CCS  F/U abd xray 3. UTI  U/A shows some evidence of contamination as there are SE's in the U/A  F/u U/A with cx 4. HTN  Cr near baseline   Restart HTN meds  5. FEN: TPN 6. DVT/PE PROPHYLAXIS: Foot pumps 7. DISPO: f/u labs       Monitor         Mearl Latin, PA-C 01/22/2011, 10:31 AM  I have reviewed findings, examined the patient myself, and agree entirely.  Also spent 30 min in direct discussion with Dr. Dion Saucier and touched base with Dr. Andrey Campanile.  Patient's ileus seems to be  improving slightly, no clinical evidence of knee infection, and improving WBC on Zosyn.  I offered to take patient to the OR for I&D and poly exchange and informed him of potential risks including wound problems, potential reaccumulation of hematoma, exacerbation of ileus, and others versus TKA infection.  The positive results on aspiration appear to be contamination given inconsistency with clinical course and exam findings.  If culture demonstrates staph epi which is not susceptible to Zosyn, then continued clinical and lab improvements would confirm suspicion that elevated WBC secondary to potential SBO/ ileus.  We plan to follow clinically with examination, inflammatory markers, and possible reaspiration if we can d/c abx altogether as ileus improves.  If any sign of knee infection then immediate I&D and poly exchange.

## 2011-01-22 NOTE — Progress Notes (Signed)
PT Cancellation Note  Treatment cancelled today due to patient receiving procedure or test   Treatment cancelled today due to patient's refusal to participate - patient in radiology earlier.  Patient reports he has "been through it" and politely refused to ambulate with PT (third attempt today).  Will return in am.  [name] [name]. Renaldo Fiddler, [facility] Pager [phone]

## 2011-01-22 NOTE — Progress Notes (Signed)
     Subjective: No abd pain. Reports large amount of flatus overnight. No bm. Feels less distended.  Has been drinking some. Wbc down.   Objective: Vital signs in last 24 hours: Temp:  [98.3 F (36.8 C)-99.6 F (37.6 C)] 98.5 F (36.9 C) (11/10 0616) Pulse Rate:  [79-101] 87  (11/10 0616) Resp:  [18-20] 18  (11/10 0616) BP: (115-161)/(78-93) 161/93 mmHg (11/10 0616) SpO2:  [97 %] 97 % (11/10 0616)   Intake/Output from previous day: 11/09 0701 - 11/10 0700 In: 2348.3 [P.O.:240; I.V.:736.3; Blood:598.5; IV Piggyback:100; TPN:673.6] Out: 2000 [Emesis/NG output:2000] NG tube has clear liquid in cannister Intake/Output this shift:    General appearance: alert, cooperative and no distress Lungs-cta ant CV-reg abd- soft, +bs. NT, ND Ext- no edema  Lab Results:   Basename 01/22/11 0500 01/21/11 0553  WBC 14.2* 17.6*  HGB 10.0* 12.0*  HCT 31.1* 34.7*  PLT 503* 556*   BMET  Basename 01/22/11 0500 01/21/11 0553  NA 134* 134*  K 3.7 4.2  CL 94* 96  CO2 29 29  GLUCOSE 123* 122*  BUN 17 13  CREATININE 1.23 1.12  CALCIUM 9.5 9.6   PT/INR  Basename 01/22/11 0500 01/21/11 1520  LABPROT 15.5* 17.1*  INR 1.20 1.37   ABG No results found for this basename: PHART:2,PCO2:2,PO2:2,HCO3:2 in the last 72 hours  Studies/Results: Dg Chest 2 View  01/21/2011  *RADIOLOGY REPORT*  Clinical Data: Leukocytosis.  Shortness of breath.  CHEST - 2 VIEW  Comparison: 01/05/2011  Findings: Left-sided PICC line tip is in the superior vena cava in good position.  NG tube is in the stomach.  The heart size and vascularity are normal.  Minimal atelectasis at the right lung base.  No infiltrates or effusions.  IMPRESSION: Minimal atelectasis at the right lung base.  Original Report Authenticated By: Gwynn Burly, M.D.    Anti-infectives: Anti-infectives     Start     Dose/Rate Route Frequency Ordered Stop   01/21/11 1730  piperacillin-tazobactam (ZOSYN) IVPB 3.375 g       3.375 g 12.5  mL/hr over 240 Minutes Intravenous Every 8 hours 01/21/11 1612            Assessment/Plan:  ID- urinalysis c/w UTI. F/u culture.  Switch from zosyn to cipro SBO- clinically improved from yesterday. Reports flatus.  Will check abd xray this am. If bowel gas pattern improved, will hold on surgery.  Cont TPN  VTE prophy - hold coumadin for now. Ok with subcu heparin until we get abd xray back & can evaluate bowel gas pattern.   [name] [name]. [name] Campanile, MD, FACS General, Bariatric, & Minimally Invasive Surgery [facility], [place]    LOS: 12 days    [name] [name] 01/22/2011

## 2011-01-23 ENCOUNTER — Encounter (HOSPITAL_COMMUNITY)
Admission: RE | Disposition: A | Discharge status: Skilled Nursing Facility | Payer: Self-pay | Source: Ambulatory Visit | Attending: Orthopedic Surgery

## 2011-01-23 ENCOUNTER — Inpatient Hospital Stay (HOSPITAL_COMMUNITY): Payer: Medicaid Other

## 2011-01-23 LAB — BASIC METABOLIC PANEL
BUN: 19 mg/dL (ref 6–23)
Calcium: 9.7 mg/dL (ref 8.4–10.5)
Creatinine, Ser: 0.96 mg/dL (ref 0.50–1.35)
GFR calc Af Amer: >90 mL/min (ref >90)
GFR calc non Af Amer: 90 mL/min — ABNORMAL LOW (ref >90)
Glucose, Bld: 109 mg/dL — ABNORMAL HIGH (ref 70–99)

## 2011-01-23 LAB — CBC
HCT: 31.7 % — ABNORMAL LOW (ref 39.0–52.0)
MCH: 31.7 pg (ref 26.0–34.0)
MCV: 94.9 fL (ref 78.0–100.0)
Platelets: 495 10*3/uL — ABNORMAL HIGH (ref 150–400)
RDW: 13.5 % (ref 11.5–15.5)

## 2011-01-23 LAB — GRAM STAIN

## 2011-01-23 LAB — PROTIME-INR: Prothrombin Time: 14.7 seconds (ref 11.6–15.2)

## 2011-01-23 LAB — SYNOVIAL CELL COUNT + DIFF, W/ CRYSTALS
Crystals, Fluid: NONE SEEN
Lymphocytes-Synovial Fld: 0 % (ref 0–20)

## 2011-01-23 LAB — GLUCOSE, CAPILLARY

## 2011-01-23 SURGERY — IRRIGATION AND DEBRIDEMENT KNEE WITH POLY EXCHANGE
Anesthesia: General | Laterality: Right

## 2011-01-23 MED ORDER — FAT EMULSION 20 % IV EMUL
250.0000 mL | INTRAVENOUS | Status: AC
  Administered 2011-01-23: 250 mL via INTRAVENOUS
  Filled 2011-01-23: qty 250

## 2011-01-23 MED ORDER — HEPARIN SODIUM (PORCINE) 5000 UNIT/ML IJ SOLN
5000.0000 [IU] | Freq: Three times a day (TID) | INTRAMUSCULAR | Status: DC
  Filled 2011-01-23 (×3): qty 1

## 2011-01-23 MED ORDER — HEPARIN SODIUM (PORCINE) 5000 UNIT/ML IJ SOLN
5000.0000 [IU] | Freq: Three times a day (TID) | INTRAMUSCULAR | Status: DC
  Filled 2011-01-23 (×4): qty 1

## 2011-01-23 MED ORDER — LIDOCAINE HCL (PF) 1 % IJ SOLN
INTRAMUSCULAR | Status: AC
  Filled 2011-01-23: qty 5

## 2011-01-23 MED ORDER — LIDOCAINE HCL (PF) 1 % IJ SOLN
5.0000 mL | Freq: Once | INTRAMUSCULAR | Status: AC
  Administered 2011-01-23: 12:00:00

## 2011-01-23 MED ORDER — BIOTENE DRY MOUTH MT LIQD
15.0000 mL | Freq: Two times a day (BID) | OROMUCOSAL | Status: DC
  Administered 2011-01-23 – 2011-01-25 (×4): 15 mL via OROMUCOSAL

## 2011-01-23 MED ORDER — WHITE PETROLATUM GEL
Status: AC
  Filled 2011-01-23: qty 5

## 2011-01-23 MED ORDER — CEFAZOLIN SODIUM 1-5 GM-% IV SOLN
1.0000 g | INTRAVENOUS | Status: AC
Start: 2011-01-23 — End: 2011-01-24
  Filled 2011-01-23: qty 50

## 2011-01-23 MED ORDER — CLINIMIX E/DEXTROSE (5/15) 5 % IV SOLN
INTRAVENOUS | Status: AC
  Administered 2011-01-23: 18:00:00 via INTRAVENOUS
  Filled 2011-01-23: qty 2000

## 2011-01-23 NOTE — Progress Notes (Addendum)
Subjective:   Procedure(s) (LRB): LAPAROSCOPY DIAGNOSTIC (N/A) EXPLORATORY LAPAROTOMY (N/A) Patient reports pain as 3 on 0-10 scale with respect to R knee States belly feeling ok No new complaints Does not feel badly  Objective:  Current Vitals Blood pressure 143/70, pulse 86, temperature 98.4 F (36.9 C), temperature source Oral, resp. rate 22, height 5\' 11"  (1.803 m), weight 92.8 kg (204 lb 9.4 oz), SpO2 99.00%. Vital signs in last 24 hours: Temp:  [98.2 F (36.8 C)-99.2 F (37.3 C)] 98.4 F (36.9 C) (11/11 0711) Pulse Rate:  [79-88] 86  (11/11 0711) Resp:  [18-22] 22  (11/11 0711) BP: (120-143)/(66-77) 143/70 mmHg (11/11 0711) SpO2:  [98 %-99 %] 99 % (11/11 0711)  Intake/Output from previous day: 11/10 0701 - 11/11 0700 In: 592 [P.O.:540; IV Piggyback:52] Out: 1200 [Urine:500; Emesis/NG output:700]  LABS  Basename 01/23/11 0703 01/22/11 0500 01/21/11 0553  HGB 10.6* 10.0* 12.0*    Basename 01/23/11 0703 01/22/11 0500  WBC 17.5* 14.2*  RBC 3.34* 3.27*  HCT 31.7* 31.1*  PLT 495* 503*    Basename 01/23/11 0703 01/22/11 0500  NA 133* 134*  K 3.9 3.7  CL 95* 94*  CO2 28 29  BUN 19 17  CREATININE 0.96 1.23  GLUCOSE 109* 123*  CALCIUM 9.7 9.5    Basename 01/23/11 0703 01/22/11 0500  LABPT -- --  INR 1.13 1.20   U/A: (01/22/2011)  Neg Nitrite  Trace LE  Few SE  Rare bacteria  ESR: 121 CRP pending   Physical Exam  Gen: NAD, + NGT  Lungs: Clear B  Cardiac: Reg  Abd: + BS, NT  Ext: R LEx  Incision line stable, looks very good, no purulence or drainage for that matter  Does not appear to demonstrate evidence of acute infection  Knee is warm, no obvioius erythema  No pain with Passive ranging of knee  Mild knee effusion  Motor and sensory functions intact distally  Ext warm, + DP pulse  No DCT  Compartments soft and nontender   Assessment/Plan:    60 y/o male s/p R TKA 01/14/2011 with complicated hospital course  1. R TKA POD 8   WBC count  elevated again today.  Zosyn was stopped yesterday and IV cipro started.    Pt appeared to be responding to zosyn, ? If GI issues causing elevated WBC count  Synovial fluid cx still pending but again clinical exam really unremarkable  Dressing changed R knee 2. GI/Ileus  Per CCS   F/U abd xray  3. UTI   F/u U/A really unremarkable with exception of trace LE   4. HTN   Home meds   5. FEN:   TPN  Hyponatremia, mild  6. DVT/PE PROPHYLAXIS:   Foot pumps   Heparin per pharmacy 7. DISPO:   f/u labs   Discuss with Gen Surg if restarting zosyn indicated   Mearl Latin, PA-C 01/23/2011, 8:37 AM   CRP noted to be 12.88  KWP

## 2011-01-23 NOTE — Plan of Care (Signed)
Problem: Phase II Progression Outcomes Goal: Tolerating diet Outcome: Progressing Patient was tolerating fluids before MD made him NPO for possible surgery today.  Problem: Problem: Mobility Progression Goal: INCREASED MOBILITY OR STRENGTH Outcome: Not Progressing PT was here to work with patient, but MD was doing a bedside aspiration of his right knee.

## 2011-01-23 NOTE — Procedures (Signed)
Mr. Erber is a 60 y/o gentleman who is approximately 9 days s/p R TKA.  Pt was supratherapeutic on coumadin for several days. Pt also developed ileus and has been followed by general surgery for this.  Pt began to develop an elevated white count as well but has been afebrile.  Pt had initial R knee arthrocentesis on 01/21/2011 which should a WBC count of around 7000 but was positive for gram + cocci in pairs.  Pt was on zosyn for GI and UTI issues, yesterday this was stopped and this am his WBC was elevated once again.  Despite this his clinical exam with respect to is R knee has remained fairly unremarkable.  Today a CRP and ESR came back which where around 12 and 112 respectively.  Thus, we decided to re-aspirated his knee for additional cultures and evaluation.  Of note it does appear that his GI issues are improving  After discussing the risks and benefits of arthrocentesis the patient was agreeable. Under sterile technique a location along the medial side of the Right knee was identified, medial and slightly superior to the patella.  The area was cleaned with alcohol swabs x4 (significant dirt remained after 2 swabs), then betadine swabs were used x 4 to create a clean field.  Swabs started at the center and worked out to the periphery.  Next 3cc of 1% lidocaine was infiltrated in the soft tissue for local anesthesia.  After adequate anesthesia a 30 cc syringe with an 18 G needle was used to infiltrate the knee joint.  The needle was felt to be intra-articular but I was only able to withdraw approximately 5cc of bloody fluid.  This fluid was sent down for stat gram stain and culture as well as cell count/fluid analysis.   Pt tolerated procedure well.  New dressing applied  Dispo:   Specimens to lab for culture (aerobic and anaerobic), gram stain, cell count, crystals    Will likely take pt to OR today for I&D R knee with poly exchange as WBC count, ESR, CRP are elevated in setting of resolving GI  issues   Continue to monitor  Mearl Latin, PA-C 01/23/2011 1135

## 2011-01-23 NOTE — Progress Notes (Signed)
PT/OT/SLP Cancellation Note  Treatment cancelled today due to medical issues with patient which prohibited therapy. Pt going to OR for I&D of left knee. Will follow up tomorrow.  Sallyanne Kuster, [place] Office- [phone] 01/23/11 11:17am

## 2011-01-23 NOTE — Progress Notes (Signed)
PARENTERAL NUTRITION CONSULT NOTE   Pharmacy Consult for tna Indication: post op ileus with large NG output, now NG clamped  Allergies  Allergen Reactions  . Sulfonamide Derivatives Rash and Other (See Comments)    REACTION: penile irritation    Labs:  [facility] 01/23/11 0703 01/22/11 0500 01/21/11 1520 01/21/11 0553  WBC 17.5* 14.2* -- 17.6*  HGB 10.6* 10.0* -- 12.0*  HCT 31.7* 31.1* -- 34.7*  PLT 495* 503* -- 556*  APTT -- -- 38* --  INR 1.13 1.20 1.37 --     Basename 01/23/11 0703 01/22/11 0500 01/21/11 0553  NA 133* 134* 134*  K 3.9 3.7 4.2  CL 95* 94* 96  CO2 28 29 29   GLUCOSE 109* 123* 122*  BUN 19 17 13   CREATININE 0.96 1.23 1.12  LABCREA -- -- --  CREAT24HRUR -- -- --  CALCIUM 9.7 9.5 9.6  MG 2.3 2.4 2.5  PHOS 3.1 3.4 3.7  PROT -- 8.1 8.2  ALBUMIN -- 2.8* 2.8*  AST -- 59* 52*  ALT -- 47 42  ALKPHOS -- 64 64  BILITOT -- 2.6* 1.9*  BILIDIR -- -- --  IBILI -- -- --  PREALBUMIN -- 9.0* 6.4*  CHOLHDL -- -- --  CHOL -- 147 --   Estimated Creatinine Clearance: 97.6 ml/min (by C-G formula based on Cr of 0.96).    Basename 01/23/11 0605 01/23/11 0004 01/22/11 1555  GLUCAP 131* 122* 119*    Insulin Requirements in the past 24 hours:  2 units ssi   Current Nutrition:  tna at 83 ml/hr well tolerated  Assessment: Tolerated goal rate of tna w/ cbgs wnl and lytes wnl. NGO 700 mls/24 hrs. KUB yest = mild improvement of SBO. Today's KUB not read yet. Baseline prealb of 6.4 and 9 reflect post-op status.  Nutritional Goals: 2000-2200 kCal, 95-110 grams of protein per day  Plan:  continue tpn at 83 ml/hr w/ fat at 10 ml/hr;  DC ssi and CBGs.  Goal rate of 83 ml/hr provides ~100gm protein and 1894 kcals  Orvella Digiulio T 01/23/2011,9:53 AM

## 2011-01-23 NOTE — Progress Notes (Signed)
     Subjective: Pt without complaints. +BM and flatus.  No nausea or vomiting.  Objective: Vital signs in last 24 hours: Temp:  [98.2 F (36.8 C)-99.2 F (37.3 C)] 98.4 F (36.9 C) (11/11 0711) Pulse Rate:  [79-88] 86  (11/11 0711) Resp:  [18-22] 22  (11/11 0711) BP: (120-143)/(66-77) 143/70 mmHg (11/11 0711) SpO2:  [98 %-99 %] 99 % (11/11 0711) Last BM Date: 01/19/11  Intake/Output from previous day: 11/10 0701 - 11/11 0700 In: 592 [P.O.:540; IV Piggyback:52] Out: 1200 [Urine:500; Emesis/NG output:700] Intake/Output this shift: Total I/O In: 60 [P.O.:60] Out: 300 [Urine:300]  PE: Abd: soft, minimal distention, +BS, NT  Lab Results:   [facility] 01/23/11 0703 01/22/11 0500  WBC 17.5* 14.2*  HGB 10.6* 10.0*  HCT 31.7* 31.1*  PLT 495* 503*   BMET  Basename 01/23/11 0703 01/22/11 0500  NA 133* 134*  K 3.9 3.7  CL 95* 94*  CO2 28 29  GLUCOSE 109* 123*  BUN 19 17  CREATININE 0.96 1.23  CALCIUM 9.7 9.5   PT/INR  Basename 01/23/11 0703 01/22/11 0500  LABPROT 14.7 15.5*  INR 1.13 1.20     Studies/Results: X-rays: persistent PSBO  Anti-infectives: Anti-infectives     Start     Dose/Rate Route Frequency Ordered Stop   01/23/11 1045   ceFAZolin (ANCEF) IVPB 1 g/50 mL premix        1 g 100 mL/hr over 30 Minutes Intravenous On call 01/23/11 1036 01/24/11 1045   01/22/11 1300   ciprofloxacin (CIPRO) IVPB 400 mg     Comments: For UTI      400 mg 200 mL/hr over 60 Minutes Intravenous Every 12 hours 01/22/11 1249     01/21/11 1730   piperacillin-tazobactam (ZOSYN) IVPB 3.375 g  Status:  Discontinued        3.375 g 12.5 mL/hr over 240 Minutes Intravenous Every 8 hours 01/21/11 1612 01/22/11 1249           Assessment/Plan  1.PSBO, improved currently 2.question UTI 3. Question septic knee  Plan: Pt clinically looks much better than his x-rays.  Will give him a trial of clears once he returns from OR for wash out of his knee. Cont abx If pt fails,  trial of diet, then will definitely need surgical intervention. abd is flat soft, and NT...will follow.   LOS: 13 days    Tracyann Duffell E 01/23/2011

## 2011-01-23 NOTE — Progress Notes (Signed)
Patient examined and I agree with the assessment and plan  Jacori Mulrooney E  

## 2011-01-24 DIAGNOSIS — M1710 Unilateral primary osteoarthritis, unspecified knee: Secondary | ICD-10-CM

## 2011-01-24 LAB — DIFFERENTIAL
Eosinophils Relative: 3 % (ref 0–5)
Lymphs Abs: 2.6 10*3/uL (ref 0.7–4.0)
Monocytes Relative: 9 % (ref 3–12)
Neutro Abs: 9.5 10*3/uL — ABNORMAL HIGH (ref 1.7–7.7)

## 2011-01-24 LAB — CBC
HCT: 31.2 % — ABNORMAL LOW (ref 39.0–52.0)
Hemoglobin: 10.7 g/dL — ABNORMAL LOW (ref 13.0–17.0)
MCH: 32.1 pg (ref 26.0–34.0)
MCHC: 34.3 g/dL (ref 30.0–36.0)
MCV: 93.7 fL (ref 78.0–100.0)
Platelets: 471 10*3/uL — ABNORMAL HIGH (ref 150–400)
RBC: 3.33 MIL/uL — ABNORMAL LOW (ref 4.22–5.81)
RDW: 13.2 % (ref 11.5–15.5)
WBC: 13.9 10*3/uL — ABNORMAL HIGH (ref 4.0–10.5)

## 2011-01-24 LAB — COMPREHENSIVE METABOLIC PANEL
ALT: 47 U/L (ref 0–53)
AST: 59 U/L — ABNORMAL HIGH (ref 0–37)
Alkaline Phosphatase: 80 U/L (ref 39–117)
CO2: 27 mEq/L (ref 19–32)
Calcium: 9.6 mg/dL (ref 8.4–10.5)
Potassium: 4 mEq/L (ref 3.5–5.1)
Sodium: 130 mEq/L — ABNORMAL LOW (ref 135–145)
Total Protein: 7.8 g/dL (ref 6.0–8.3)

## 2011-01-24 LAB — CHOLESTEROL, TOTAL: Cholesterol: 126 mg/dL (ref 0–200)

## 2011-01-24 LAB — PHOSPHORUS: Phosphorus: 3.4 mg/dL (ref 2.3–4.6)

## 2011-01-24 LAB — PROTIME-INR
INR: 1.07 (ref 0.00–1.49)
Prothrombin Time: 14.1 seconds (ref 11.6–15.2)

## 2011-01-24 LAB — TRIGLYCERIDES: Triglycerides: 111 mg/dL (ref <150)

## 2011-01-24 LAB — MAGNESIUM: Magnesium: 2.2 mg/dL (ref 1.5–2.5)

## 2011-01-24 MED ORDER — FAT EMULSION 20 % IV EMUL
250.0000 mL | INTRAVENOUS | Status: DC
  Administered 2011-01-24: 250 mL via INTRAVENOUS
  Filled 2011-01-24: qty 250

## 2011-01-24 MED ORDER — ENOXAPARIN SODIUM 40 MG/0.4ML ~~LOC~~ SOLN
40.0000 mg | Freq: Once | SUBCUTANEOUS | Status: AC
  Administered 2011-01-24: 40 mg via SUBCUTANEOUS
  Filled 2011-01-24: qty 0.4

## 2011-01-24 MED ORDER — ENOXAPARIN SODIUM 40 MG/0.4ML ~~LOC~~ SOLN
40.0000 mg | SUBCUTANEOUS | Status: DC
  Administered 2011-01-25: 40 mg via SUBCUTANEOUS
  Filled 2011-01-24 (×2): qty 0.4

## 2011-01-24 MED ORDER — M.V.I. ADULT IV INJ
INJECTION | INTRAVENOUS | Status: DC
  Administered 2011-01-24: 17:00:00 via INTRAVENOUS
  Filled 2011-01-24: qty 2000

## 2011-01-24 NOTE — Consult Note (Signed)
ID Brief Consult Note 01/24/2011 [name] [name] is 60 yo man who had total right knee replacemtment 01/11/11 and was uncomplicated until he became hypercoagulable on warfarin and bled into his right knee. He had no fevers but had some elevation in WBC. Athrocenteces on 11/9 and 11/12 are compatible with intra-articular bleeding and cultures have been no growth but cultues on 11/12 were done when on antibiotics (vancomycin). Gram stains of arthrocenteses were reported as showing "gram positive cocci on 11/9 but negative on 11/12. I have reviewed all the records and seen and examined the patient. Right knee is swollen but not very warm and not more than mildly tender. Most importantly I have reviewed both gram stains from 11/9 and 11/12 and NEITHER SHOW ANY BACTERIA.  Recommendations"   1. Can D/C antibiotics   2.No need to replace any of his hardware and advance his rehabilitation  I shared my recommendations with patient and his wife. Lina Sayre

## 2011-01-24 NOTE — Progress Notes (Signed)
01/24/11 OT NOTE: attempted skillled OT tx. Session: pt. declines Stating "i already canceled therapy with the other girl, I have surgery tomorrow, so i want to hold off for now". Will continue to follow as pt. Allows, Robet Leu, COTA/L [phone]

## 2011-01-24 NOTE — Progress Notes (Signed)
01/24/11 PT Note: Attempted PT session, however pt politely declining (on the phone on hold with the gas company); Will reattempt later today as time allows; Of note, if we proceed with surgery, will need reorder with WB, ROM status; Will continue to follow, thanks, [place], [place] [phone]

## 2011-01-24 NOTE — Progress Notes (Signed)
Clinical Social Work-Please see shadow chart for full assessment. CSW initiating FL2 and bed search; will follow and provide family with offers. Jodean Lima, [phone]

## 2011-01-24 NOTE — Progress Notes (Signed)
  Subjective: Pt feeling better. 2 good BMs and flatus. No N/V, tolerating clears well. No surgery for (R)knee planned at this point.  Objective: Vital signs in last 24 hours: Temp:  [98.1 F (36.7 C)-98.7 F (37.1 C)] 98.1 F (36.7 C) (11/12 0645) Pulse Rate:  [79-87] 87  (11/12 0645) Resp:  [18-20] 18  (11/12 0645) BP: (108-125)/(69-75) 125/75 mmHg (11/12 0645) SpO2:  [90 %-100 %] 100 % (11/12 0645) Last BM Date: 01/22/11  Intake/Output this shift:    Physical Exam: Abdomen: softer, less distended. Good BS. NT  Labs: CBC  Basename 01/24/11 0500 01/23/11 0703  WBC 13.9* 17.5*  HGB 10.7* 10.6*  HCT 31.2* 31.7*  PLT 471* 495*   BMET  Basename 01/24/11 0500 01/23/11 0703  NA 130* 133*  K 4.0 3.9  CL 94* 95*  CO2 27 28  GLUCOSE 105* 109*  BUN 17 19  CREATININE 0.89 0.96  CALCIUM 9.6 9.7   LFT  Basename 01/24/11 0500  PROT 7.8  ALBUMIN 2.6*  AST 59*  ALT 47  ALKPHOS 80  BILITOT 1.1  BILIDIR --  IBILI --  LIPASE --    Studies/Results: Dg Abd 2 Views Pls Do By 0700  01/23/2011  *RADIOLOGY REPORT*  Clinical Data: Abdominal distension  ABDOMEN - 2 VIEW  Comparison: 01/22/2011  Findings: Multiple stable dilated loops of small bowel, measuring up to 5.8 cm, suggesting partial small bowel obstruction.  The descending colon is not decompressed.  No evidence of free air on the lateral decubitus view.  Enteric tube looped in the stomach.  Mild degenerative changes of the visualized thoracolumbar spine.  IMPRESSION: Multiple dilated loops of small bowel, stable, suggesting partial small bowel obstruction.  No evidence of free air.  Original Report Authenticated By: Charline Bills, M.D.     Assessment: Principal Problem:  *Primary osteoarthritis of left knee, s/p (R)TKA Active Problems:  SBO, partial, now resolving with conservative mgmt.  Plan: Increase diet to fulls. Activity per ortho.  LOS: 14 days    Marianna Fuss 01/24/2011

## 2011-01-24 NOTE — Progress Notes (Signed)
PARENTERAL NUTRITION CONSULT NOTE   Pharmacy Consult for tna Indication: post op ileus   Allergies  Allergen Reactions  . Sulfonamide Derivatives Rash and Other (See Comments)    REACTION: penile irritation    Labs:  Basename 01/24/11 0500 01/23/11 0703 01/22/11 0500 01/21/11 1520  WBC 13.9* 17.5* 14.2* --  HGB 10.7* 10.6* 10.0* --  HCT 31.2* 31.7* 31.1* --  PLT 471* 495* 503* --  APTT -- -- -- 38*  INR 1.07 1.13 1.20 --     Basename 01/24/11 0500 01/23/11 0703 01/22/11 0500  NA 130* 133* 134*  K 4.0 3.9 3.7  CL 94* 95* 94*  CO2 27 28 29   GLUCOSE 105* 109* 123*  BUN 17 19 17   CREATININE 0.89 0.96 1.23  LABCREA -- -- --  CREAT24HRUR -- -- --  CALCIUM 9.6 9.7 9.5  MG 2.2 2.3 2.4  PHOS 3.4 3.1 3.4  PROT 7.8 -- 8.1  ALBUMIN 2.6* -- 2.8*  AST 59* -- 59*  ALT 47 -- 47  ALKPHOS 80 -- 64  BILITOT 1.1 -- 2.6*  BILIDIR -- -- --  IBILI -- -- --  PREALBUMIN -- -- 9.0*  CHOLHDL -- -- --  CHOL 126 -- 621   Estimated Creatinine Clearance: 105.3 ml/min (by C-G formula based on Cr of 0.89).    Basename 01/23/11 1627 01/23/11 1134 01/23/11 0605  GLUCAP 109* 105* 131*    Current Nutrition:  tna at 83 ml/hr well tolerated, diet advanced to full liquids this am. If does OK with full liq today will prob advance to reg diet 11/13 and dc tpn 11/13 at 1800. Assessment: Had 2 BMs and flatus. To advance to full liquids today.  KUB yest =  improvement of SBO.  Baseline prealb of 6.4 and 9 reflect post-op status.  Nutritional Goals: 2000-2200 kCal, 95-110 grams of protein per day  Plan:  continue tpn at 83 ml/hr w/ fat at 10 ml/hr;  Diet advanced to full liquids today. Hopefully will be able to stop tpn tomorrow after bag infused (1800).   Goal rate of 83 ml/hr provides ~100gm protein and 1894 kcals  Len Childs T 01/24/2011,8:22 AM

## 2011-01-24 NOTE — Progress Notes (Signed)
  Subjective:  The patient reports that his knee is feeling reasonably well. He is trying to figure out how to make the CPM go higher into deeper flexion. He says things up and getting better and better with therapy and is getting more independent.  Patient reports pain as moderate.  He has finally been able to tolerate the clear liquid diet, and says that he has now had something is now for the first time in 2 weeks. He denies nausea or vomiting.  Objective:   VITALS:  Temp:  [98.1 F (36.7 C)-98.7 F (37.1 C)] 98.1 F (36.7 C) (11/12 0645) Pulse Rate:  [79-87] 87  (11/12 0645) Resp:  [18-20] 18  (11/12 0645) BP: (108-125)/(69-75) 125/75 mmHg (11/12 0645) SpO2:  [90 %-100 %] 100 % (11/12 0645)  Neurologically intact Sensation intact distally Dorsiflexion/Plantar flexion intact Incision: no drainage   LABS Lab Results  Component Value Date   WBC 13.9* 01/24/2011   HGB 10.7* 01/24/2011   HCT 31.2* 01/24/2011   MCV 93.7 01/24/2011   PLT 471* 01/24/2011   Lab Results  Component Value Date   INR 1.07 01/24/2011   Lab Results  Component Value Date   NA 130* 01/24/2011   K 4.0 01/24/2011   CL 94* 01/24/2011   CO2 27 01/24/2011   BUN 17 01/24/2011   CREATININE 0.89 01/24/2011   GLUCOSE 105* 01/24/2011     Assessment/Plan:  Status post right total knee arthroplasty, with postoperative small bowel obstruction, which seems to be resolving.  This is an extremely challenging clinical situation. His knee is feeling well, looking great clinically, but had a positive Gram stain on a knee aspirate. The repeat knee aspirate was negative, although this was after he was on antibiotics. The cell count in both aspirates are low, and not suspicious at all for infection. His elevated white blood cell count is also somewhat down, although he is now on antibiotics. I was in consistent communication with Dr. Myrene Galas throughout the weekend, as well as Kirstin Shepperson PA-C at the end  of last week, and discussions about the best course of action.  A repeat surgical insult would be potentially disastrous to his abdomen. Additionally, getting his knee to heal without being capable of taking by mouth nutrition is certainly going to be challenging.  The risk of not doing surgery however is that he could potentially develop a deeper seated infection, which were would require a two-stage explants and reimplantation. I have discussed all of this with the patient, and also counseled him that I have seen patient's lose their leg from infection around total knee.  The culture results so far are negative from the first aspiration. I'm going to wait the course of today on the culture results, and also discuss his case with infectious disease. It is certainly possible that the positive Gram stain from the first aspirate was a contaminant. In which case, I would hate to subject him to a I&D of the total knee arthroplasty, which could potentially have serious complications as far as his abdomen goes. On the other hand, I would hate to allow infection to become deep-seated on a knee arthroplasty, which would ultimately require substantially more surgery.  This is certainly a diagnostic and clinical dilemma with high risk complications potentially either way we proceed. This has all been discussed with the patient.   [name] [name] P 01/24/2011, 8:21 AM

## 2011-01-24 NOTE — Progress Notes (Signed)
I have seen and examined the patient and agree with the assessment and plans  

## 2011-01-25 LAB — URINE CULTURE
Colony Count: NO GROWTH
Culture  Setup Time: 201211112140

## 2011-01-25 LAB — PROTIME-INR: Prothrombin Time: 14.7 seconds (ref 11.6–15.2)

## 2011-01-25 MED ORDER — TRACE MINERALS CR-CU-MN-SE-ZN 10-1000-500-60 MCG/ML IV SOLN: INTRAVENOUS | Status: AC

## 2011-01-25 MED ORDER — FAT EMULSION 20 % IV EMUL: 250.0000 mL | INTRAVENOUS | Status: AC

## 2011-01-25 NOTE — Progress Notes (Signed)
Subjective:   Patient reports pain as 6 on 0-10 scale.  Tolerating clear liquid diet, and is planning to advance today.    Objective: Vital signs in last 24 hours: Temp:  [98 F (36.7 C)-98.9 F (37.2 C)] 98.9 F (37.2 C) (11/13 0607) Pulse Rate:  [70-84] 70  (11/13 0607) Resp:  [18] 18  (11/13 0607) BP: (117-126)/(67-81) 126/67 mmHg (11/13 0607) SpO2:  [98 %-99 %] 99 % (11/13 0607)  Intake/Output from previous day: 11/12 0701 - 11/13 0700 In: 2944.2 [P.O.:1080; I.V.:236.7; TPN:1627.5] Out: 1452 [Urine:1450; Stool:2] Intake/Output this shift:     Basename 01/24/11 0500 01/23/11 0703  HGB 10.7* 10.6*    Basename 01/24/11 0500 01/23/11 0703  WBC 13.9* 17.5*  RBC 3.33* 3.34*  HCT 31.2* 31.7*  PLT 471* 495*    Basename 01/24/11 0500 01/23/11 0703  NA 130* 133*  K 4.0 3.9  CL 94* 95*  CO2 27 28  BUN 17 19  CREATININE 0.89 0.96  GLUCOSE 105* 109*  CALCIUM 9.6 9.7    Basename 01/25/11 0530 01/24/11 0500  LABPT -- --  INR 1.13 1.07    Neurologically intact Dorsiflexion/Plantar flexion intact Incision: dressing C/D/I  Assessment/Plan:   status post total knee arthroplasty with postoperative small bowel obstruction that appears to be resolving.  I greatly appreciated Dr. Bonnetta Barry input regarding the "positive" Gram stain from the knee aspirate. This is in fact turned out to be a false positive. There is no clinical evidence for a septic knee. Therefore he is going to continue physical therapy, weightbearing as tolerated, use Lovenox for DVT prophylaxis for a period of 3-4 weeks postoperatively, and we'll plan to be discharged to skilled nursing facility. I would anticipate this in the next day or 2, and will defer to general surgery regarding the timing on this, based on his abdominal progress.  Alem Fahl P 01/25/2011, 9:04 AM

## 2011-01-25 NOTE — Progress Notes (Signed)
  Subjective: Pt feeling good. 2 more good BMs. No N/V. Reviewed Ortho and ID notes. No abx needed. Pt wants to go to SNF for rehab  Objective: Vital signs in last 24 hours: Temp:  [98 F (36.7 C)-98.9 F (37.2 C)] 98.9 F (37.2 C) (11/13 0607) Pulse Rate:  [70-84] 70  (11/13 0607) Resp:  [18] 18  (11/13 0607) BP: (117-126)/(67-81) 126/67 mmHg (11/13 0607) SpO2:  [98 %-99 %] 99 % (11/13 0607) Last BM Date: 01/24/11  Intake/Output this shift:    Physical Exam: Abdomen: soft, BD, NT Active BS  Labs: CBC  Basename 01/24/11 0500 01/23/11 0703  WBC 13.9* 17.5*  HGB 10.7* 10.6*  HCT 31.2* 31.7*  PLT 471* 495*   BMET  Basename 01/24/11 0500 01/23/11 0703  NA 130* 133*  K 4.0 3.9  CL 94* 95*  CO2 27 28  GLUCOSE 105* 109*  BUN 17 19  CREATININE 0.89 0.96  CALCIUM 9.6 9.7   LFT  Basename 01/24/11 0500  PROT 7.8  ALBUMIN 2.6*  AST 59*  ALT 47  ALKPHOS 80  BILITOT 1.1  BILIDIR --  IBILI --  LIPASE --   PT/INR  Basename 01/25/11 0530 01/24/11 0500  LABPROT 14.7 14.1  INR 1.13 1.07   ABG No results found for this basename: PHART:2,PCO2:2,PO2:2,HCO3:2 in the last 72 hours  Studies/Results: No results found.   Assessment: Principal Problem:  *Primary osteoarthritis of left knee Active Problems:  SBO (small bowel obstruction)  Plan: Advance diet. DC abx Begin DC planning for SNF  LOS: 15 days    Marianna Fuss 01/25/2011

## 2011-01-25 NOTE — Progress Notes (Signed)
CSW completed FL2 and faxed pt out to requested SNF geographic area.

## 2011-01-25 NOTE — Progress Notes (Signed)
I have seen and examined the patient and agree with the assessment and plans  

## 2011-01-26 LAB — BODY FLUID CULTURE: Culture: NO GROWTH

## 2011-01-26 MED ORDER — OXYCODONE-ACETAMINOPHEN 5-325 MG PO TABS: 1.0000 | ORAL_TABLET | ORAL | Status: AC | PRN

## 2011-01-26 MED ORDER — WHITE PETROLATUM GEL
Status: AC
  Administered 2011-01-26: 01:00:00
  Filled 2011-01-26: qty 5

## 2011-01-26 MED ORDER — ENOXAPARIN SODIUM 40 MG/0.4ML ~~LOC~~ SOLN: 40.0000 mg | SUBCUTANEOUS | Status: DC

## 2011-01-26 NOTE — Progress Notes (Signed)
  Subjective: Pt ok. Had another BM last pm. Denies abd pain, N/V Doing well with PT  Objective: Vital signs in last 24 hours: Temp:  [97.7 F (36.5 C)-98.7 F (37.1 C)] 97.7 F (36.5 C) (11/14 0608) Pulse Rate:  [67-88] 67  (11/14 0608) Resp:  [16-20] 18  (11/14 0608) BP: (127-138)/(71-82) 127/72 mmHg (11/14 0608) SpO2:  [97 %-100 %] 97 % (11/14 0608) Last BM Date: 01/25/11  Intake/Output this shift:    Physical Exam: Abdomen: soft, NT, ND  Labs: CBC  Basename 01/24/11 0500  WBC 13.9*  HGB 10.7*  HCT 31.2*  PLT 471*   BMET  Basename 01/24/11 0500  NA 130*  K 4.0  CL 94*  CO2 27  GLUCOSE 105*  BUN 17  CREATININE 0.89  CALCIUM 9.6   LFT  Basename 01/24/11 0500  PROT 7.8  ALBUMIN 2.6*  AST 59*  ALT 47  ALKPHOS 80  BILITOT 1.1  BILIDIR --  IBILI --  LIPASE --   PT/INR  Basename 01/25/11 0530 01/24/11 0500  LABPROT 14.7 14.1  INR 1.13 1.07   ABG No results found for this basename: PHART:2,PCO2:2,PO2:2,HCO3:2 in the last 72 hours  Studies/Results: No results found.   Assessment: Principal Problem:  *Primary osteoarthritis of left knee Active Problems:  SBO (small bowel obstruction)  Plan: DC to SNF today upon approval. Await word from CSW  LOS: 16 days    [name] [name] F 01/26/2011

## 2011-01-26 NOTE — Progress Notes (Signed)
01/26/11 Physical Therapy Note: Attempted session, however pt declining; plan is to dc to SNF for rehab today; Thank you, [place], [place] [phone]

## 2011-01-26 NOTE — Discharge Summary (Signed)
Physician Discharge Summary  Patient ID: [name] [name] MRN: [id number] DOB/AGE: March 31, 1950 60 y.o.  Admit date: 01/10/2011 Discharge date: 01/26/2011  Admission Diagnoses: (R)knee severe osteoarthritis Discharge Diagnoses:  Principal Problem:  *Primary osteoarthritis of left knee, s/p (R)TKA Post operative PSBO-resolved Discharged Condition: good  Hospital Course: Pt admitted by Dr. Dion Saucier on 10/3- for elective (R)TKA and underwent that successfully. However he developed some postoperative N/V and abdominal distention. He was seen in consultation by Dr. Gaynelle Adu who recommended NGT and bowel rest as PSBO/ileus was suspected. He was placed on anticoagulation and developed hemarthrosis of the (R)knee. Athrocentesis was performed on 11/9 by ortho team, specimen sent to lab, but infection ultimately ruled out. Antiobiotics were discontinued. His ileus took a long time to resolve but he gradually had return of bowel function. Once he was passing flatus, his NG was removed and diet initiated. He is now moving his bowels regularly and tolerating regular diet. He has made progress with his therapy for his knee but has decided to pursue continued rehab at skilled facility. Both orthopedic and gen surgery service feel he is appropriate for DC.  Consults: ID-Dr. Maurice March assisted in decision for antibiotic treatment.   Discharge Exam: Blood pressure 127/72, pulse 67, temperature 97.7 F (36.5 C), temperature source Oral, resp. rate 18, height 5\' 11"  (1.803 m), weight 204 lb 9.4 oz (92.8 kg), SpO2 97.00%. GI: normal findings: bowel sounds normal and soft, non-tender   Disposition:   Discharge Orders    Future Orders Please Complete By Expires   Diet - low sodium heart healthy      Call MD / Call 911      Comments:   If you experience chest pain or shortness of breath, CALL 911 and be transported to the hospital emergency room.  If you develope a fever above 101 F, pus (white drainage) or  increased drainage or redness at the wound, or calf pain, call your surgeon's office.   Constipation Prevention      Comments:   Drink plenty of fluids.  Prune juice may be helpful.  You may use a stool softener, such as Colace (over the counter) 100 mg twice a day.  Use MiraLax (over the counter) for constipation as needed.   Increase activity slowly as tolerated      Weight Bearing as taught in Physical Therapy      Comments:   Use a walker or crutches as instructed.   Discharge instructions      Comments:   Keep wound dry.   CPM      Comments:   Continuous passive motion machine (CPM):      Use the CPM from 0 to 60 for 4 hours per day.      You may increase by 10 per day.  You may break it up into 2 or 3 sessions per day.      Use CPM for 3 weeks or until you are told to stop.   Change dressing      Comments:   Change dressing every other day as needed, then change the dressing daily with sterile 4 x 4 inch gauze dressing and apply TED hose.  You may clean the incision with alcohol prior to redressing.   Do not put a pillow under the knee. Place it under the heel.        Current Discharge Medication List    START taking these medications   Details  enoxaparin (LOVENOX) 30 MG/0.3ML SOLN Inject  0.4 mLs (40 mg total) into the skin daily. Qty: 21 Syringe, Refills: 0    methocarbamol (ROBAXIN) 500 MG tablet Take 1 tablet (500 mg total) by mouth 4 (four) times daily. Qty: 75 tablet, Refills: 1    oxyCODONE-acetaminophen (PERCOCET) 10-325 MG per tablet Take 1-2 tablets by mouth every 6 (six) hours as needed for pain. MAXIMUM TOTAL ACETAMINOPHEN DOSE IS 4000 MG PER DAY Qty: 75 tablet, Refills: 0      CONTINUE these medications which have NOT CHANGED   Details  atorvastatin (LIPITOR) 10 MG tablet Take 10 mg by mouth daily.      lisinopril (PRINIVIL,ZESTRIL) 20 MG tablet Take 20 mg by mouth daily.         Follow-up Information    Follow up with LANDAU,JOSHUA P in 2 weeks.    Contact information:   [facility] [address], [address] [place] [address] [phone]          Signed: Marianna Fuss 01/26/2011, 10:43 AM

## 2011-01-26 NOTE — Progress Notes (Signed)
I have seen and examined the patient and agree with the assessment and plans  

## 2011-01-26 NOTE — Progress Notes (Signed)
Subjective:    Patient reports pain as 3 on 0-10 scale.  Ready to go home.  Tolerating diet.  Objective: Vital signs in last 24 hours: Temp:  [97.7 F (36.5 C)-98.7 F (37.1 C)] 97.7 F (36.5 C) (11/14 0608) Pulse Rate:  [67-88] 67  (11/14 0608) Resp:  [16-20] 18  (11/14 0608) BP: (127-137)/(72-82) 127/72 mmHg (11/14 0608) SpO2:  [97 %-100 %] 97 % (11/14 0608)  Intake/Output from previous day: 11/13 0701 - 11/14 0700 In: 600 [P.O.:600] Out: 2386 [Urine:2085; Stool:301] Intake/Output this shift: Total I/O In: 240 [P.O.:240] Out: 200 [Urine:200]   Basename 01/24/11 0500  HGB 10.7*    Basename 01/24/11 0500  WBC 13.9*  RBC 3.33*  HCT 31.2*  PLT 471*    Basename 01/24/11 0500  NA 130*  K 4.0  CL 94*  CO2 27  BUN 17  CREATININE 0.89  GLUCOSE 105*  CALCIUM 9.6    Basename 01/25/11 0530 01/24/11 0500  LABPT -- --  INR 1.13 1.07    Neurologically intact ABD soft Dorsiflexion/Plantar flexion intact Incision: dressing C/D/I  Assessment/Plan:    Advance diet Up with therapy Discharge home with home health  Jakell Trusty P 01/26/2011, 1:38 PM

## 2011-01-26 NOTE — Progress Notes (Signed)
CSW arranged transfer and transport of pt to [facility].  Pt to be picked up at 5pm to ensure wife will be able to meet pt at SNF once she is off work.  CSW to sign off at dc.

## 2011-01-27 LAB — BODY FLUID CULTURE

## 2011-05-23 ENCOUNTER — Other Ambulatory Visit: Payer: Self-pay | Admitting: Orthopedic Surgery

## 2011-06-07 ENCOUNTER — Encounter (HOSPITAL_COMMUNITY): Payer: Self-pay | Admitting: Pharmacy Technician

## 2011-06-15 ENCOUNTER — Encounter (HOSPITAL_COMMUNITY)
Admission: RE | Admit: 2011-06-15 | Discharge: 2011-06-15 | Disposition: A | Discharge status: Home / Self Care | Payer: Medicaid Other | Source: Ambulatory Visit | Attending: Orthopedic Surgery | Admitting: Orthopedic Surgery

## 2011-06-15 ENCOUNTER — Encounter (HOSPITAL_COMMUNITY): Payer: Self-pay

## 2011-06-15 HISTORY — DX: Shortness of breath: R06.02

## 2011-06-15 HISTORY — DX: Inflammatory liver disease, unspecified: K75.9

## 2011-06-15 HISTORY — DX: Essential (primary) hypertension: I10

## 2011-06-15 LAB — CBC
Platelets: 260 10*3/uL (ref 150–400)
RBC: 4.88 MIL/uL (ref 4.22–5.81)
RDW: 13.5 % (ref 11.5–15.5)
WBC: 7.6 10*3/uL (ref 4.0–10.5)

## 2011-06-15 LAB — URINALYSIS, ROUTINE W REFLEX MICROSCOPIC
Bilirubin Urine: NEGATIVE
Glucose, UA: NEGATIVE mg/dL
Hgb urine dipstick: NEGATIVE
Protein, ur: NEGATIVE mg/dL
Specific Gravity, Urine: 1.025 (ref 1.005–1.030)
Urobilinogen, UA: 1 mg/dL (ref 0.0–1.0)

## 2011-06-15 LAB — COMPREHENSIVE METABOLIC PANEL
AST: 44 U/L — ABNORMAL HIGH (ref 0–37)
Albumin: 3.5 g/dL (ref 3.5–5.2)
Alkaline Phosphatase: 109 U/L (ref 39–117)
CO2: 26 mEq/L (ref 19–32)
Chloride: 102 mEq/L (ref 96–112)
Creatinine, Ser: 1.26 mg/dL (ref 0.50–1.35)
GFR calc non Af Amer: 61 mL/min — ABNORMAL LOW (ref >90)
Potassium: 4.1 mEq/L (ref 3.5–5.1)
Total Bilirubin: 0.3 mg/dL (ref 0.3–1.2)

## 2011-06-15 LAB — PROTIME-INR: Prothrombin Time: 13.7 seconds (ref 11.6–15.2)

## 2011-06-15 LAB — TYPE AND SCREEN
ABO/RH(D): O POS
Antibody Screen: NEGATIVE

## 2011-06-15 LAB — SURGICAL PCR SCREEN: Staphylococcus aureus: NEGATIVE

## 2011-06-15 NOTE — Progress Notes (Signed)
Cardiac cath in 05.  Echo 08 in epic.  Repeat cxr due to abn result

## 2011-06-15 NOTE — Pre-Procedure Instructions (Signed)
20 [name] [name]  06/15/2011   Your procedure is scheduled on:  06/21/11  Report to [facility] at 530 AM.  Call this number if you have problems the morning of surgery: [phone]   Remember:   Do not eat food:After Midnight.  May have clear liquids: up to 4 Hours before arrival.  Clear liquids include soda, tea, black coffee, apple or grape juice, broth.  Take these medicines the morning of surgery with A SIP OF WATER: ultram   Do not wear jewelry, make-up or nail polish.  Do not wear lotions, powders, or perfumes. You may wear deodorant.  Do not shave 48 hours prior to surgery.  Do not bring valuables to the hospital.  Contacts, dentures or bridgework may not be worn into surgery.  Leave suitcase in the car. After surgery it may be brought to your room.  For patients admitted to the hospital, checkout time is 11:00 AM the day of discharge.   Patients discharged the day of surgery will not be allowed to drive home.  Name and phone number of your driver: family  Special Instructions: CHG Shower Use Special Wash: 1/2 bottle night before surgery and 1/2 bottle morning of surgery.   Please read over the following fact sheets that you were given: Pain Booklet, Coughing and Deep Breathing, Blood Transfusion Information, MRSA Information and Surgical Site Infection Prevention

## 2011-06-20 MED ORDER — CEFAZOLIN SODIUM-DEXTROSE 2-3 GM-% IV SOLR
2.0000 g | INTRAVENOUS | Status: AC
  Administered 2011-06-21: 2 g via INTRAVENOUS
  Filled 2011-06-20: qty 50

## 2011-06-21 ENCOUNTER — Encounter (HOSPITAL_COMMUNITY): Payer: Self-pay | Admitting: Anesthesiology

## 2011-06-21 ENCOUNTER — Ambulatory Visit (HOSPITAL_COMMUNITY): Payer: Medicaid Other

## 2011-06-21 ENCOUNTER — Encounter (HOSPITAL_COMMUNITY): Payer: Self-pay | Admitting: Orthopedic Surgery

## 2011-06-21 ENCOUNTER — Inpatient Hospital Stay (HOSPITAL_COMMUNITY)
Admission: RE | Admit: 2011-06-21 | Discharge: 2011-06-24 | DRG: 470 | Disposition: A | Discharge status: Home Health | Payer: Medicaid Other | Source: Ambulatory Visit | Attending: Orthopedic Surgery | Admitting: Orthopedic Surgery

## 2011-06-21 ENCOUNTER — Ambulatory Visit (HOSPITAL_COMMUNITY): Payer: Medicaid Other | Admitting: Anesthesiology

## 2011-06-21 ENCOUNTER — Encounter (HOSPITAL_COMMUNITY)
Admission: RE | Disposition: A | Discharge status: Home Health | Payer: Self-pay | Source: Ambulatory Visit | Attending: Orthopedic Surgery

## 2011-06-21 DIAGNOSIS — Z96659 Presence of unspecified artificial knee joint: Secondary | ICD-10-CM

## 2011-06-21 DIAGNOSIS — Z01812 Encounter for preprocedural laboratory examination: Secondary | ICD-10-CM

## 2011-06-21 DIAGNOSIS — D62 Acute posthemorrhagic anemia: Secondary | ICD-10-CM | POA: Diagnosis not present

## 2011-06-21 DIAGNOSIS — I10 Essential (primary) hypertension: Secondary | ICD-10-CM | POA: Diagnosis present

## 2011-06-21 DIAGNOSIS — Z79899 Other long term (current) drug therapy: Secondary | ICD-10-CM

## 2011-06-21 DIAGNOSIS — M1710 Unilateral primary osteoarthritis, unspecified knee: Principal | ICD-10-CM | POA: Diagnosis present

## 2011-06-21 DIAGNOSIS — E871 Hypo-osmolality and hyponatremia: Secondary | ICD-10-CM | POA: Diagnosis not present

## 2011-06-21 DIAGNOSIS — F17200 Nicotine dependence, unspecified, uncomplicated: Secondary | ICD-10-CM | POA: Diagnosis present

## 2011-06-21 DIAGNOSIS — B1920 Unspecified viral hepatitis C without hepatic coma: Secondary | ICD-10-CM | POA: Diagnosis present

## 2011-06-21 DIAGNOSIS — M1712 Unilateral primary osteoarthritis, left knee: Secondary | ICD-10-CM | POA: Diagnosis present

## 2011-06-21 HISTORY — DX: Unilateral primary osteoarthritis, left knee: M17.12

## 2011-06-21 SURGERY — ARTHROPLASTY, KNEE, TOTAL
Anesthesia: General | Site: Knee | Laterality: Left | Wound class: Clean

## 2011-06-21 MED ORDER — METOCLOPRAMIDE HCL 5 MG/ML IJ SOLN: 5.0000 mg | Freq: Three times a day (TID) | INTRAMUSCULAR | Status: DC | PRN

## 2011-06-21 MED ORDER — ENOXAPARIN SODIUM 30 MG/0.3ML ~~LOC~~ SOLN
30.0000 mg | Freq: Two times a day (BID) | SUBCUTANEOUS | Status: DC
  Administered 2011-06-22 – 2011-06-24 (×5): 30 mg via SUBCUTANEOUS
  Filled 2011-06-21 (×7): qty 0.3

## 2011-06-21 MED ORDER — HYDROMORPHONE HCL PF 1 MG/ML IJ SOLN
0.2500 mg | INTRAMUSCULAR | Status: DC | PRN
  Administered 2011-06-21 (×3): 0.5 mg via INTRAVENOUS

## 2011-06-21 MED ORDER — PHENOL 1.4 % MT LIQD: 1.0000 | OROMUCOSAL | Status: DC | PRN

## 2011-06-21 MED ORDER — ENOXAPARIN SODIUM 40 MG/0.4ML ~~LOC~~ SOLN: 40.0000 mg | SUBCUTANEOUS | Status: DC

## 2011-06-21 MED ORDER — ACETAMINOPHEN 650 MG RE SUPP: 650.0000 mg | Freq: Four times a day (QID) | RECTAL | Status: DC | PRN

## 2011-06-21 MED ORDER — ZOLPIDEM TARTRATE 5 MG PO TABS: 5.0000 mg | ORAL_TABLET | Freq: Every evening | ORAL | Status: DC | PRN

## 2011-06-21 MED ORDER — ROCURONIUM BROMIDE 100 MG/10ML IV SOLN
INTRAVENOUS | Status: DC | PRN
  Administered 2011-06-21: 50 mg via INTRAVENOUS
  Administered 2011-06-21: 20 mg via INTRAVENOUS
  Administered 2011-06-21: 10 mg via INTRAVENOUS

## 2011-06-21 MED ORDER — ALUM & MAG HYDROXIDE-SIMETH 200-200-20 MG/5ML PO SUSP: 30.0000 mL | ORAL | Status: DC | PRN

## 2011-06-21 MED ORDER — OXYCODONE HCL 5 MG PO TABS
5.0000 mg | ORAL_TABLET | ORAL | Status: DC | PRN
  Administered 2011-06-22 – 2011-06-24 (×5): 10 mg via ORAL
  Filled 2011-06-21 (×5): qty 2

## 2011-06-21 MED ORDER — METHOCARBAMOL 500 MG PO TABS
500.0000 mg | ORAL_TABLET | Freq: Four times a day (QID) | ORAL | Status: DC | PRN
  Administered 2011-06-21 – 2011-06-24 (×4): 500 mg via ORAL
  Filled 2011-06-21 (×5): qty 1

## 2011-06-21 MED ORDER — SODIUM CHLORIDE 0.9 % IR SOLN
Status: DC | PRN
  Administered 2011-06-21: 1000 mL

## 2011-06-21 MED ORDER — TRAZODONE HCL 100 MG PO TABS
100.0000 mg | ORAL_TABLET | Freq: Every day | ORAL | Status: DC
  Administered 2011-06-21 – 2011-06-23 (×3): 100 mg via ORAL
  Filled 2011-06-21 (×4): qty 1

## 2011-06-21 MED ORDER — HYDROMORPHONE HCL PF 1 MG/ML IJ SOLN
0.5000 mg | INTRAMUSCULAR | Status: DC | PRN
Start: 2011-06-21 — End: 2011-06-24
  Administered 2011-06-21 – 2011-06-24 (×12): 1 mg via INTRAVENOUS
  Filled 2011-06-21 (×12): qty 1

## 2011-06-21 MED ORDER — LACTATED RINGERS IV SOLN
INTRAVENOUS | Status: DC | PRN
  Administered 2011-06-21 (×2): via INTRAVENOUS

## 2011-06-21 MED ORDER — LIDOCAINE HCL (CARDIAC) 20 MG/ML IV SOLN
INTRAVENOUS | Status: DC | PRN
  Administered 2011-06-21: 40 mg via INTRAVENOUS

## 2011-06-21 MED ORDER — ONDANSETRON HCL 4 MG/2ML IJ SOLN
INTRAMUSCULAR | Status: DC | PRN
  Administered 2011-06-21: 4 mg via INTRAVENOUS

## 2011-06-21 MED ORDER — KETOROLAC TROMETHAMINE 30 MG/ML IJ SOLN
30.0000 mg | Freq: Four times a day (QID) | INTRAMUSCULAR | Status: DC
  Administered 2011-06-21 – 2011-06-24 (×12): 30 mg via INTRAVENOUS
  Filled 2011-06-21 (×17): qty 1

## 2011-06-21 MED ORDER — FENTANYL CITRATE 0.05 MG/ML IJ SOLN
INTRAMUSCULAR | Status: DC | PRN
  Administered 2011-06-21 (×2): 50 ug via INTRAVENOUS
  Administered 2011-06-21: 150 ug via INTRAVENOUS
  Administered 2011-06-21: 50 ug via INTRAVENOUS
  Administered 2011-06-21 (×2): 100 ug via INTRAVENOUS

## 2011-06-21 MED ORDER — LABETALOL HCL 5 MG/ML IV SOLN
INTRAVENOUS | Status: DC | PRN
  Administered 2011-06-21: 10 mg via INTRAVENOUS
  Administered 2011-06-21: 5 mg via INTRAVENOUS

## 2011-06-21 MED ORDER — HYDRALAZINE HCL 20 MG/ML IJ SOLN
INTRAMUSCULAR | Status: DC | PRN
  Administered 2011-06-21 (×2): 5 mg via INTRAVENOUS

## 2011-06-21 MED ORDER — DEXTROSE 5 % IV SOLN
500.0000 mg | Freq: Four times a day (QID) | INTRAVENOUS | Status: DC | PRN
  Filled 2011-06-21: qty 5

## 2011-06-21 MED ORDER — DEXTROSE 5 % IV SOLN
INTRAVENOUS | Status: DC | PRN
  Administered 2011-06-21: 08:00:00 via INTRAVENOUS

## 2011-06-21 MED ORDER — LISINOPRIL 20 MG PO TABS
20.0000 mg | ORAL_TABLET | Freq: Every day | ORAL | Status: DC
  Administered 2011-06-21 – 2011-06-24 (×4): 20 mg via ORAL
  Filled 2011-06-21 (×4): qty 1

## 2011-06-21 MED ORDER — METOCLOPRAMIDE HCL 10 MG PO TABS: 5.0000 mg | ORAL_TABLET | Freq: Three times a day (TID) | ORAL | Status: DC | PRN

## 2011-06-21 MED ORDER — GLYCOPYRROLATE 0.2 MG/ML IJ SOLN
INTRAMUSCULAR | Status: DC | PRN
  Administered 2011-06-21: .5 mg via INTRAVENOUS

## 2011-06-21 MED ORDER — METOCLOPRAMIDE HCL 10 MG PO TABS
5.0000 mg | ORAL_TABLET | Freq: Three times a day (TID) | ORAL | Status: DC | PRN
  Filled 2011-06-21: qty 1

## 2011-06-21 MED ORDER — BUPIVACAINE-EPINEPHRINE PF 0.5-1:200000 % IJ SOLN
INTRAMUSCULAR | Status: DC | PRN
  Administered 2011-06-21: 30 mL

## 2011-06-21 MED ORDER — OXYCODONE-ACETAMINOPHEN 5-325 MG PO TABS
1.0000 | ORAL_TABLET | ORAL | Status: DC | PRN
  Administered 2011-06-21 – 2011-06-24 (×10): 2 via ORAL
  Filled 2011-06-21 (×11): qty 2

## 2011-06-21 MED ORDER — HYDROMORPHONE HCL PF 1 MG/ML IJ SOLN
INTRAMUSCULAR | Status: AC
  Filled 2011-06-21: qty 1

## 2011-06-21 MED ORDER — LACTATED RINGERS IV SOLN: INTRAVENOUS | Status: DC

## 2011-06-21 MED ORDER — PROMETHAZINE HCL 25 MG PO TABS: 25.0000 mg | ORAL_TABLET | Freq: Four times a day (QID) | ORAL | Status: DC | PRN

## 2011-06-21 MED ORDER — SENNA 8.6 MG PO TABS
1.0000 | ORAL_TABLET | Freq: Two times a day (BID) | ORAL | Status: DC
  Administered 2011-06-21 – 2011-06-23 (×6): 8.6 mg via ORAL
  Filled 2011-06-21 (×8): qty 1

## 2011-06-21 MED ORDER — METHOCARBAMOL 500 MG PO TABS: 500.0000 mg | ORAL_TABLET | Freq: Four times a day (QID) | ORAL | Status: AC

## 2011-06-21 MED ORDER — MIDAZOLAM HCL 5 MG/5ML IJ SOLN
INTRAMUSCULAR | Status: DC | PRN
  Administered 2011-06-21: 2 mg via INTRAVENOUS

## 2011-06-21 MED ORDER — ONDANSETRON HCL 4 MG/2ML IJ SOLN: 4.0000 mg | Freq: Four times a day (QID) | INTRAMUSCULAR | Status: DC | PRN

## 2011-06-21 MED ORDER — NEOSTIGMINE METHYLSULFATE 1 MG/ML IJ SOLN
INTRAMUSCULAR | Status: DC | PRN
  Administered 2011-06-21: 4 mg via INTRAVENOUS

## 2011-06-21 MED ORDER — PROPOFOL 10 MG/ML IV EMUL
INTRAVENOUS | Status: DC | PRN
  Administered 2011-06-21: 150 mg via INTRAVENOUS

## 2011-06-21 MED ORDER — METOCLOPRAMIDE HCL 5 MG/ML IJ SOLN
10.0000 mg | Freq: Three times a day (TID) | INTRAMUSCULAR | Status: AC
  Administered 2011-06-21 – 2011-06-24 (×9): 10 mg via INTRAVENOUS
  Filled 2011-06-21 (×10): qty 2

## 2011-06-21 MED ORDER — METHOCARBAMOL 100 MG/ML IJ SOLN
500.0000 mg | INTRAVENOUS | Status: AC
  Filled 2011-06-21: qty 5

## 2011-06-21 MED ORDER — ONDANSETRON HCL 4 MG PO TABS: 4.0000 mg | ORAL_TABLET | Freq: Four times a day (QID) | ORAL | Status: DC | PRN

## 2011-06-21 MED ORDER — MENTHOL 3 MG MT LOZG: 1.0000 | LOZENGE | OROMUCOSAL | Status: DC | PRN

## 2011-06-21 MED ORDER — KETOROLAC TROMETHAMINE 30 MG/ML IJ SOLN
INTRAMUSCULAR | Status: AC
  Filled 2011-06-21: qty 1

## 2011-06-21 MED ORDER — DIPHENHYDRAMINE HCL 12.5 MG/5ML PO ELIX
12.5000 mg | ORAL_SOLUTION | ORAL | Status: DC | PRN
  Administered 2011-06-24: 25 mg via ORAL
  Filled 2011-06-21: qty 10

## 2011-06-21 MED ORDER — DOCUSATE SODIUM 100 MG PO CAPS
100.0000 mg | ORAL_CAPSULE | Freq: Two times a day (BID) | ORAL | Status: DC
  Administered 2011-06-21 – 2011-06-23 (×6): 100 mg via ORAL
  Filled 2011-06-21 (×8): qty 1

## 2011-06-21 MED ORDER — ACETAMINOPHEN 325 MG PO TABS: 650.0000 mg | ORAL_TABLET | Freq: Four times a day (QID) | ORAL | Status: DC | PRN

## 2011-06-21 MED ORDER — WHITE PETROLATUM GEL
Status: AC
  Administered 2011-06-21: 0.2
  Filled 2011-06-21: qty 5

## 2011-06-21 MED ORDER — POTASSIUM CHLORIDE IN NACL 20-0.45 MEQ/L-% IV SOLN
INTRAVENOUS | Status: DC
  Administered 2011-06-21 (×2): via INTRAVENOUS
  Administered 2011-06-22: 125 mL/h via INTRAVENOUS
  Administered 2011-06-22 (×2): via INTRAVENOUS
  Filled 2011-06-21 (×15): qty 1000

## 2011-06-21 MED ORDER — OXYCODONE-ACETAMINOPHEN 10-325 MG PO TABS: 1.0000 | ORAL_TABLET | Freq: Four times a day (QID) | ORAL | Status: AC | PRN

## 2011-06-21 MED ORDER — CEFAZOLIN SODIUM 1-5 GM-% IV SOLN
1.0000 g | Freq: Four times a day (QID) | INTRAVENOUS | Status: AC
  Administered 2011-06-21 – 2011-06-22 (×3): 1 g via INTRAVENOUS
  Filled 2011-06-21 (×3): qty 50

## 2011-06-21 MED ORDER — ATORVASTATIN CALCIUM 10 MG PO TABS
10.0000 mg | ORAL_TABLET | Freq: Every day | ORAL | Status: DC
  Administered 2011-06-21 – 2011-06-24 (×4): 10 mg via ORAL
  Filled 2011-06-21 (×4): qty 1

## 2011-06-21 SURGICAL SUPPLY — 62 items
APL SKNCLS STERI-STRIP NONHPOA (GAUZE/BANDAGES/DRESSINGS)
BANDAGE ELASTIC 6 VELCRO ST LF (GAUZE/BANDAGES/DRESSINGS) ×3 IMPLANT
BANDAGE ESMARK 6X9 LF (GAUZE/BANDAGES/DRESSINGS) ×1 IMPLANT
BENZOIN TINCTURE PRP APPL 2/3 (GAUZE/BANDAGES/DRESSINGS) ×1 IMPLANT
BLADE SAG 18X100X1.27 (BLADE) ×2 IMPLANT
BLADE SAW RECIP 87.9 MT (BLADE) ×2 IMPLANT
BLADE SAW SGTL 13X75X1.27 (BLADE) ×2 IMPLANT
BNDG CMPR 9X6 STRL LF SNTH (GAUZE/BANDAGES/DRESSINGS) ×1
BNDG ESMARK 6X9 LF (GAUZE/BANDAGES/DRESSINGS) ×2
BOOTCOVER CLEANROOM LRG (PROTECTIVE WEAR) ×2 IMPLANT
BOWL SMART MIX CTS (DISPOSABLE) ×2 IMPLANT
CEMENT HV SMART SET (Cement) ×4 IMPLANT
CLOTH BEACON ORANGE TIMEOUT ST (SAFETY) ×2 IMPLANT
CLSR STERI-STRIP ANTIMIC 1/2X4 (GAUZE/BANDAGES/DRESSINGS) ×1 IMPLANT
COVER SURGICAL LIGHT HANDLE (MISCELLANEOUS) ×3 IMPLANT
CUFF TOURNIQUET SINGLE 34IN LL (TOURNIQUET CUFF) ×1 IMPLANT
DRAPE EXTREMITY T 121X128X90 (DRAPE) ×2 IMPLANT
DRAPE PROXIMA HALF (DRAPES) ×2 IMPLANT
DRAPE U-SHAPE 47X51 STRL (DRAPES) ×2 IMPLANT
DRSG PAD ABDOMINAL 8X10 ST (GAUZE/BANDAGES/DRESSINGS) ×1 IMPLANT
DURAPREP 26ML APPLICATOR (WOUND CARE) ×2 IMPLANT
ELECT CAUTERY BLADE 6.4 (BLADE) ×2 IMPLANT
ELECT REM PT RETURN 9FT ADLT (ELECTROSURGICAL) ×2
ELECTRODE REM PT RTRN 9FT ADLT (ELECTROSURGICAL) ×1 IMPLANT
EVACUATOR 1/8 PVC DRAIN (DRAIN) ×1 IMPLANT
FACESHIELD LNG OPTICON STERILE (SAFETY) ×2 IMPLANT
GLOVE BIO SURGEON STRL SZ8.5 (GLOVE) ×2 IMPLANT
GLOVE BIOGEL PI IND STRL 6 (GLOVE) IMPLANT
GLOVE BIOGEL PI IND STRL 8 (GLOVE) ×2 IMPLANT
GLOVE BIOGEL PI INDICATOR 6 (GLOVE) ×2
GLOVE BIOGEL PI INDICATOR 8 (GLOVE) ×3
GLOVE ORTHO TXT STRL SZ7.5 (GLOVE) ×2 IMPLANT
GLOVE SURG ORTHO 8.0 STRL STRW (GLOVE) ×2 IMPLANT
GLOVE SURG SS PI 8.5 STRL IVOR (GLOVE) ×2
GLOVE SURG SS PI 8.5 STRL STRW (GLOVE) IMPLANT
HANDPIECE INTERPULSE COAX TIP (DISPOSABLE) ×2
HOOD PEEL AWAY FACE SHEILD DIS (HOOD) ×4 IMPLANT
KIT BASIN OR (CUSTOM PROCEDURE TRAY) ×2 IMPLANT
KIT ROOM TURNOVER OR (KITS) ×2 IMPLANT
MANIFOLD NEPTUNE II (INSTRUMENTS) ×2 IMPLANT
NS IRRIG 1000ML POUR BTL (IV SOLUTION) ×2 IMPLANT
PACK TOTAL JOINT (CUSTOM PROCEDURE TRAY) ×2 IMPLANT
PAD ARMBOARD 7.5X6 YLW CONV (MISCELLANEOUS) ×4 IMPLANT
PAD CAST 4YDX4 CTTN HI CHSV (CAST SUPPLIES) ×1 IMPLANT
PADDING CAST COTTON 4X4 STRL (CAST SUPPLIES)
PADDING CAST COTTON 6X4 STRL (CAST SUPPLIES) ×1 IMPLANT
SET HNDPC FAN SPRY TIP SCT (DISPOSABLE) ×1 IMPLANT
SPONGE GAUZE 4X4 12PLY (GAUZE/BANDAGES/DRESSINGS) ×1 IMPLANT
STAPLER VISISTAT 35W (STAPLE) ×2 IMPLANT
STRIP CLOSURE SKIN 1/2X4 (GAUZE/BANDAGES/DRESSINGS) ×1 IMPLANT
SUCTION FRAZIER TIP 10 FR DISP (SUCTIONS) ×2 IMPLANT
SUT MNCRL AB 4-0 PS2 18 (SUTURE) IMPLANT
SUT VIC AB 0 CT1 27 (SUTURE) ×2
SUT VIC AB 0 CT1 27XBRD ANBCTR (SUTURE) ×1 IMPLANT
SUT VIC AB 2-0 CT1 27 (SUTURE) ×2
SUT VIC AB 2-0 CT1 TAPERPNT 27 (SUTURE) ×1 IMPLANT
SUT VIC AB 3-0 SH 18 (SUTURE) ×1 IMPLANT
SYR 30ML LL (SYRINGE) ×2 IMPLANT
TOWEL OR 17X24 6PK STRL BLUE (TOWEL DISPOSABLE) ×2 IMPLANT
TOWEL OR 17X26 10 PK STRL BLUE (TOWEL DISPOSABLE) ×2 IMPLANT
TRAY FOLEY CATH 14FR (SET/KITS/TRAYS/PACK) ×1 IMPLANT
WATER STERILE IRR 1000ML POUR (IV SOLUTION) ×6 IMPLANT

## 2011-06-21 NOTE — Progress Notes (Signed)
Orthopedic Tech Progress Note Patient Details:  [name] [name] 1950/06/03 [id number]  CPM Left Knee CPM Left Knee: On Left Knee Flexion (Degrees): 40  Left Knee Extension (Degrees): 0  Additional Comments: 1345   [name] T 06/21/2011, 12:47 PM

## 2011-06-21 NOTE — Progress Notes (Signed)
Report given to maria rn as caregiver 

## 2011-06-21 NOTE — Plan of Care (Signed)
Problem: Consults Goal: Diagnosis- Total Joint Replacement Primary Total Knee Left     

## 2011-06-21 NOTE — Anesthesia Postprocedure Evaluation (Signed)
  Anesthesia Post-op Note  Patient: [name] [name]  Procedure(s) Performed: Procedure(s) (LRB): TOTAL KNEE ARTHROPLASTY (Left)  Patient Location: PACU  Anesthesia Type: GA combined with regional for post-op pain  Level of Consciousness: awake, alert  and oriented  Airway and Oxygen Therapy: Patient Spontanous Breathing and Patient connected to nasal cannula oxygen  Post-op Pain: mild  Post-op Assessment: Post-op Vital signs reviewed, Patient's Cardiovascular Status Stable, Respiratory Function Stable, Patent Airway, No signs of Nausea or vomiting and Pain level controlled  Post-op Vital Signs: Reviewed and stable  Complications: No apparent anesthesia complications

## 2011-06-21 NOTE — Op Note (Signed)
DATE OF SURGERY:  06/21/2011 TIME: 9:49 AM  PATIENT NAME:  [name] [name]   AGE: 61 y.o.    PRE-OPERATIVE DIAGNOSIS:  DJD LEFT KNEE  POST-OPERATIVE DIAGNOSIS:  Same  PROCEDURE:  Procedure(s): TOTAL KNEE ARTHROPLASTY   SURGEON:  Eulas Post, MD   ASSISTANT:  Janace Litten, OPA-C, present and scrubbed throughout the case, critical for assistance with exposure, retraction, instrumentation, and closure.   OPERATIVE IMPLANTS: Depuy PFC Sigma, Posterior Stabilized.  Femur size 4, Tibia size 4, Patella size 38 3-peg oval button, with a 10 mm polyethylene insert.   PREOPERATIVE INDICATIONS:  [name] [name] is a 61 y.o. year old male with end stage bone on bone degenerative arthritis of the knee who failed conservative treatment, including injections, antiinflammatories, activity modification, and assistive devices, and had significant impairment of their activities of daily living, and elected for Total Knee Arthroplasty.   The risks, benefits, and alternatives were discussed at length including but not limited to the risks of infection, bleeding, nerve injury, stiffness, blood clots, the need for revision surgery, cardiopulmonary complications, among others, and they were willing to proceed.   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 This was symmetric with the contralateral side. A unicompartmental replacement would not have been appropriate, do to the extensive patellofemoral disease. His postoperative range of motion was 0 to 120, and he had a no thumb patellar tracking, with excellent stability with balanced  flexion and extension gaps.  OPERATIVE DESCRIPTION:  The patient was brought to the operative room and placed in a supine position.  General anesthesia was administered.  IV antibiotics were given.  The lower extremity was prepped and draped in the usual sterile  fashion.  Time out was performed.  The leg was elevated and exsanguinated and the tourniquet was inflated.  Anterior quadriceps tendon splitting approach was performed.  The patella was everted and osteophytes were removed.  The anterior horn of the medial and lateral meniscus was removed.   The distal femur was opened with the drill and the intramedullary distal femoral cutting jig was utilized, set at 5 degrees resecting 10 mm off the distal femur.  Care was taken to protect the collateral ligaments.  Then the extramedullary tibial cutting jig was utilized making the appropriate cut using the anterior tibial crest as a reference building in appropriate posterior slope.  Care was taken during the cut to protect the medial and collateral ligaments.  The proximal tibia was removed along with the posterior horns of the menisci.  The PCL was sacrificed.    The extensor gap was measured and was approximately 10mm.    The distal femoral sizing jig was applied, taking care to avoid notching.  Then the 4-in-1 cutting jig was applied and the anterior and posterior femur was cut, along with the chamfer cuts.  All posterior osteophytes were removed.  The flexion gap was then measured and was symmetric with the extension gap.  I completed the distal femoral preparation using the appropriate jig to prepare the box.  The patella was then measured, and cut with the saw.  The patella measured 29 mm before the cut and 18 mm afterwards.  The proximal tibia sized and prepared accordingly with the reamer and the punch, and then all components were trialed with the 10mm poly insert.  The knee was found to have excellent balance and full motion.    The above  named components were then cemented into place and all excess cement was removed.  The trial polyethylene component was in place during cementation, and then was exchanged for the real polyethylene component.    The knee was easily taken through a range of motion  and the patella tracked well and the knee irrigated copiously and the parapatellar and subcutaneous tissue closed with vicryl, and monocryl with steri strips for the skin.  The wounds were injected with marcaine, and dressed with sterile gauze and the tourniquet released and the patient was awakened and returned to the PACU in stable and satisfactory condition.  There were no complications.  Total tourniquet time was 100 minutes.

## 2011-06-21 NOTE — Transfer of Care (Signed)
Immediate Anesthesia Transfer of Care Note  Patient: [name] [name]  Procedure(s) Performed: Procedure(s) (LRB): TOTAL KNEE ARTHROPLASTY (Left)  Patient Location: PACU  Anesthesia Type: General and GA combined with regional for post-op pain  Level of Consciousness: awake, alert  and oriented  Airway & Oxygen Therapy: Patient Spontanous Breathing and Patient connected to nasal cannula oxygen  Post-op Assessment: Report given to PACU RN and Post -op Vital signs reviewed and stable  Post vital signs: Reviewed and stable  Complications: No apparent anesthesia complications

## 2011-06-21 NOTE — H&P (Signed)
  PREOPERATIVE H&P  Chief Complaint: DJD LEFT KNEE  HPI: [name] [name] is a 61 y.o. male who presents for preoperative history and physical with a diagnosis of DJD LEFT KNEE. Symptoms are rated as moderate to severe, and have been worsening.  This is significantly impairing activities of daily living.  He has elected for surgical management.   Past Medical History  Diagnosis Date  . Hypertension   . Shortness of breath   . Hepatitis     c   Past Surgical History  Procedure Date  . Cardiac catheterization     2005   dr Olen Cordial  . Joint replacement     rt knee  . Colon surgery     2002   colon  [facility]   dr Lurene Shadow      [place]mo  [place]   History   Social History  . Marital Status: Legally Separated    Spouse Name: N/A    Number of Children: N/A  . Years of Education: N/A   Social History Main Topics  . Smoking status: Current Some Day Smoker -- 1.0 packs/day for 5 years    Types: Cigarettes  . Smokeless tobacco: Not on file  . Alcohol Use: 3.0 oz/week    5 Cans of beer per week     oct   2012  . Drug Use: No  . Sexually Active:    Other Topics Concern  . Not on file   Social History Narrative  . No narrative on file   No family history on file. Allergies  Allergen Reactions  . Sulfonamide Derivatives Rash and Other (See Comments)    REACTION: penile irritation   Prior to Admission medications   Medication Sig Start Date End Date Taking? Authorizing Provider  atorvastatin (LIPITOR) 10 MG tablet Take 10 mg by mouth daily.     Yes Historical Provider, MD  lisinopril (PRINIVIL,ZESTRIL) 20 MG tablet Take 20 mg by mouth daily.     Yes Historical Provider, MD  traMADol (ULTRAM) 50 MG tablet Take 50 mg by mouth every 8 (eight) hours as needed. For pain   Yes Historical Provider, MD  traZODone (DESYREL) 100 MG tablet Take 100 mg by mouth at bedtime.   Yes Historical Provider, MD     Positive ROS: All other systems have been reviewed and were otherwise negative with the  exception of those mentioned in the HPI and as above.  Physical Exam: General: Alert, no acute distress Cardiovascular: No pedal edema Respiratory: No cyanosis, no use of accessory musculature GI: No organomegaly, abdomen is soft and non-tender Skin: No lesions in the area of chief complaint Neurologic: Sensation intact distally Psychiatric: Patient is competent for consent with normal mood and affect Lymphatic: No axillary or cervical lymphadenopathy  MUSCULOSKELETAL: left knee with crepitance and pain with ROM.   Assessment: DJD LEFT KNEE  Plan: Plan for Procedure(s): TOTAL KNEE ARTHROPLASTY  The risks benefits and alternatives were discussed with the patient including but not limited to the risks of nonoperative treatment, versus surgical intervention including infection, bleeding, nerve injury,  blood clots, cardiopulmonary complications, morbidity, mortality, among others, and they were willing to proceed. Will keep npo after surgery given history of ileus.  Eulas Post, MD 06/21/2011 7:29 AM

## 2011-06-21 NOTE — Anesthesia Preprocedure Evaluation (Addendum)
Anesthesia Evaluation  Patient identified by MRN, date of birth, ID band Patient awake    Reviewed: Allergy & Precautions, H&P , NPO status , Patient's Chart, lab work & pertinent test results  Airway Mallampati: I TM Distance: >3 FB Neck ROM: Full    Dental  (+) Edentulous Upper, Dental Advisory Given, Poor Dentition and Partial Lower   Pulmonary shortness of breath and with exertion, Current Smoker,  breath sounds clear to auscultation  Pulmonary exam normal       Cardiovascular Exercise Tolerance: Good hypertension, Pt. on medications + CAD (stent '02, stress test 10/12 normal, ECHO '08 normal LVF) Rhythm:Regular Rate:Normal     Neuro/Psych negative neurological ROS  negative psych ROS   GI/Hepatic negative GI ROS, (+) Hepatitis -, C  Endo/Other  negative endocrine ROS  Renal/GU negative Renal ROS  negative genitourinary   Musculoskeletal  (+) Arthritis -, Osteoarthritis,    Abdominal   Peds  Hematology negative hematology ROS (+)   Anesthesia Other Findings   Reproductive/Obstetrics                        Anesthesia Physical Anesthesia Plan  ASA: III  Anesthesia Plan: General   Post-op Pain Management:    Induction: Intravenous  Airway Management Planned: Oral ETT  Additional Equipment:   Intra-op Plan:   Post-operative Plan: Extubation in OR  Informed Consent: I have reviewed the patients History and Physical, chart, labs and discussed the procedure including the risks, benefits and alternatives for the proposed anesthesia with the patient or authorized representative who has indicated his/her understanding and acceptance.   Dental advisory given  Plan Discussed with: CRNA and Surgeon  Anesthesia Plan Comments: (Pt had cup of black coffee at 0400 06/21/2011. Plan routine monitors, GETA with femoral nerve block for post op analgesia)      Anesthesia Quick Evaluation

## 2011-06-21 NOTE — Anesthesia Procedure Notes (Addendum)
Procedure Name: Intubation Date/Time: 06/21/2011 7:52 AM Performed by: Nicholos Johns Pre-anesthesia Checklist: Patient identified, Emergency Drugs available, Suction available, Patient being monitored and Timeout performed Patient Re-evaluated:Patient Re-evaluated prior to inductionOxygen Delivery Method: Circle system utilized Preoxygenation: Pre-oxygenation with 100% oxygen Intubation Type: IV induction Ventilation: Mask ventilation without difficulty Laryngoscope Size: Mac and 4 Grade View: Grade I Tube type: Oral Tube size: 7.5 mm Number of attempts: 1 Airway Equipment and Method: Stylet Placement Confirmation: positive ETCO2,  breath sounds checked- equal and bilateral and ETT inserted through vocal cords under direct vision Secured at: 22 cm Tube secured with: Tape Dental Injury: Teeth and Oropharynx as per pre-operative assessment    Anesthesia Regional Block:  Femoral nerve block  Pre-Anesthetic Checklist: ,, timeout performed, Correct Patient, Correct Site, Correct Laterality, Correct Procedure, Correct Position, site marked, Risks and benefits discussed,  Surgical consent,  Pre-op evaluation,  At surgeon's request and post-op pain management  Laterality: Left  Prep: chloraprep       Needles:  Injection technique: Single-shot  Needle Type: Stimulator Needle - 40     Needle Length:cm 4 cm Needle Gauge: 22 and 22 G    Additional Needles:  Procedures: nerve stimulator Femoral nerve block  Nerve Stimulator or Paresthesia:  Response: patella twitch, 0.45 mA, 0.1 ms,   Additional Responses:   Narrative:  Start time: 06/21/2011 7:20 AM End time: 06/21/2011 7:24 AM Injection made incrementally with aspirations every 5 mL.  Performed by: Personally  Anesthesiologist: Sandford Craze, MD  Additional Notes: Pt identified in Holding room.  Monitors applied. Working IV access confirmed. Sterile prep L groin.  #22ga PNS to patellar twitch at 0.42mA threshold.  30cc 0.5%  Bupivacaine with 1:200k epi injected incrementally after negative test dose.  Patient asymptomatic, VSS, no heme aspirated, tolerated well.   Sandford Craze, MD

## 2011-06-21 NOTE — Progress Notes (Signed)
Xray left knee done as ordered

## 2011-06-21 NOTE — Preoperative (Signed)
Beta Blockers   Reason not to administer Beta Blockers:Not Applicable 

## 2011-06-22 ENCOUNTER — Encounter (HOSPITAL_COMMUNITY): Payer: Self-pay | Admitting: Orthopedic Surgery

## 2011-06-22 DIAGNOSIS — D62 Acute posthemorrhagic anemia: Secondary | ICD-10-CM | POA: Diagnosis not present

## 2011-06-22 DIAGNOSIS — E871 Hypo-osmolality and hyponatremia: Secondary | ICD-10-CM | POA: Diagnosis not present

## 2011-06-22 LAB — BASIC METABOLIC PANEL
BUN: 12 mg/dL (ref 6–23)
Calcium: 8.4 mg/dL (ref 8.4–10.5)
GFR calc Af Amer: 76 mL/min — ABNORMAL LOW (ref >90)
GFR calc non Af Amer: 65 mL/min — ABNORMAL LOW (ref >90)
Potassium: 3.7 mEq/L (ref 3.5–5.1)
Sodium: 132 mEq/L — ABNORMAL LOW (ref 135–145)

## 2011-06-22 LAB — CBC
MCH: 30.7 pg (ref 26.0–34.0)
MCHC: 33.5 g/dL (ref 30.0–36.0)
RDW: 13.9 % (ref 11.5–15.5)

## 2011-06-22 NOTE — Discharge Instructions (Signed)
Total Knee Replacement In total knee replacement, the damaged knee is replaced with an artificial knee joint (prosthesis). The purpose of this surgery is to reduce pain and improve your range of motion. Regaining a near normal range of motion of your knee in the first 3 to 6 weeks after surgery is critical. Generally, these artificial joints last a minimum of 10 years. By that time, about 1 in 10 patients will need another surgery to repair the loose prosthesis. LET YOUR CAREGIVER KNOW ABOUT:   Allergies to food or medicine.   Medicines taken, including vitamins, herbs, eyedrops, over-the-counter medicines, and creams.   Use of steroids (by mouth or creams).   Previous problems with anesthetics or numbing medicines.   History of bleeding problems or blood clots.   Previous surgery.   Other health problems, including diabetes and kidney problems.   Possibility of pregnancy, if this applies.  RISKS AND COMPLICATIONS   Knee pain.   Loss of range of motion of the knee (stiffness) or instability.   Loosening of the prosthesis.   Infection.  BEFORE THE PROCEDURE   If you smoke, quit.   You may need a replacement or addition of blood (transfusion) during this procedure. You may want to donate your own blood for storage several weeks before the procedure. This way, your own blood can be stored and given to you if needed. Talk to your surgeon about this option.   Do not eat or drink anything for as long as directed by your caregiver before the procedure.   You may bathe and brush your teeth before the procedure. Do not swallow the water when brushing your teeth.   Scrub the area to be operated on for 5 minutes in the morning before the procedure. Use special soap if you are directed to do so by your caregiver.   Take your regular medicines with a small sip of water unless otherwise instructed. Your caregiver will let you know if there are medicines that need to be stopped and  for how long.   You should be present 60 minutes before your procedure or as directed by your caregiver.  PROCEDURE  Before surgery, an intravenous (IV) access for giving fluids will be started. You will be given medicines and/or gas to make you sleep (anesthetic). You may be given medicines in your back with a needle to make you numb from the waist down. Your surgeon will take out any damaged cartilage and bone. He or she will then put in new metal, plastic, or ceramic joint surfaces to restore alignment and function to your knee. AFTER THE PROCEDURE  You will be taken to the recovery area where a nurse will watch and check your progress. You may have a long, narrow tube (catheter) in your bladder after surgery. The catheter helps you empty your bladder (pass your urine). You may have drainage tubes coming out from under the dressing. These tubes attach to a device that removes blood or fluids that gather after surgery. Once you are awake, stable, and taking fluids well, you will be returned to your room. You will receive physical therapy as prescribed by your caregiver. If you do not have help at home, you may need to go to an extended care facility for a few weeks. If you are sent home with a continuous passive motion (CPM) machine, use it as instructed. Document Released: 06/06/2000 Document Revised: 02/17/2011 Document Reviewed: 12/31/2008 [facility] Patient Information 2012 [place], [place].  Home Health to be provided by [facility] [phone] 

## 2011-06-22 NOTE — Progress Notes (Signed)
CARE MANAGEMENT NOTE 06/22/2011  Patient:  [name] [name], [name] [name]   Account Number:  [id number]  Date Initiated:  06/22/2011  Documentation initiated by:  Vance Peper  Subjective/Objective Assessment:   61 yr old male s/p left total knee arthroplasty     Action/Plan:   Spoke with patient regarding Home Health needs. Preoperatively setup with [facility], no changes. Patient has rolling walker and 3in1, needs CPM and tub bench.   Anticipated DC Date:  06/23/2011   Anticipated DC Plan:  HOME W HOME HEALTH SERVICES      DC Planning Services  CM consult      PAC Choice  DURABLE MEDICAL EQUIPMENT  HOME HEALTH   Choice offered to / List presented to:  C-1 Patient   DME arranged  CPM  TUB BENCH        HH arranged  HH-2 PT      [facility] agency  [facility]   Status of service:  In process, will continue to follow Medicare Important Message given?   (If response is "NO", the following Medicare IM given date fields will be blank) Date Medicare IM given:   Date Additional Medicare IM given:    Discharge Disposition:    Per UR Regulation:    If discussed at Long Length of Stay Meetings, dates discussed:    Comments:

## 2011-06-22 NOTE — Progress Notes (Signed)
Patient ID: [name] [name], male   DOB: March 20, 1950, 61 y.o.   MRN: [id number]     1 Day Post-Op   Subjective:  Patient reports pain as mild to moderate.  Patient states that he is doing well.  Denies any CP or SOB.  He also states that he has been able to pass Gas but has not had a bowel movement.  Objective:   VITALS:   Filed Vitals:   06/21/11 1200 06/21/11 1904 06/21/11 2104 06/22/11 0558  BP: 142/71 127/75 140/76 121/63  Pulse: 66 55 71 82  Temp: 97.7 F (36.5 C) 98 F (36.7 C) 99.3 F (37.4 C) 101.1 F (38.4 C)  TempSrc: Oral Oral Oral   Resp: 18 18 18 18   SpO2: 96% 98% 99% 95%    ABD soft Sensation intact distally Dorsiflexion/Plantar flexion intact Incision: dressing C/D/I and no drainage  LABS Lab Results  Component Value Date   HGB 11.6* 06/22/2011   HGB 15.4 06/15/2011   HGB 10.7* 01/24/2011   CBC    Component Value Date/Time   WBC 6.1 06/22/2011 0625   WBC 6.9 10/07/2010 1044   RBC 3.78* 06/22/2011 0625   RBC 4.47 10/07/2010 1044   HGB 11.6* 06/22/2011 0625   HGB 14.9 10/07/2010 1044   HCT 34.6* 06/22/2011 0625   HCT 43.8 10/07/2010 1044   PLT 204 06/22/2011 0625   PLT 192 10/07/2010 1044   MCV 91.5 06/22/2011 0625   MCV 98.0 10/07/2010 1044   MCH 30.7 06/22/2011 0625   MCH 33.3 10/07/2010 1044   MCHC 33.5 06/22/2011 0625   MCHC 34.0 10/07/2010 1044   RDW 13.9 06/22/2011 0625   RDW 14.2 10/07/2010 1044   LYMPHSABS 2.6 01/24/2011 0500   LYMPHSABS 2.3 10/07/2010 1044   MONOABS 1.3* 01/24/2011 0500   MONOABS 0.4 10/07/2010 1044   EOSABS 0.4 01/24/2011 0500   EOSABS 0.2 10/07/2010 1044   BASOSABS 0.1 01/24/2011 0500   BASOSABS 0.0 10/07/2010 1044    Lab Results  Component Value Date   INR 1.03 06/15/2011   Lab Results  Component Value Date   NA 132* 06/22/2011   K 3.7 06/22/2011   CL 101 06/22/2011   CO2 24 06/22/2011   BUN 12 06/22/2011   CREATININE 1.19 06/22/2011   GLUCOSE 92 06/22/2011   Dg Chest 2 View  06/15/2011  *RADIOLOGY REPORT*  Clinical Data:  Hypertension, tobacco use, preop knee arthroplasty  CHEST - 2 VIEW  Comparison: 01/21/2011  Findings: Mildly tortuous thoracic aorta. Lungs clear.  Heart size and pulmonary vascularity normal.  No effusion.  Visualized bones unremarkable.  IMPRESSION: No acute disease  Original Report Authenticated By: Osa Craver, M.D.   X-ray Knee Left Port  06/21/2011  *RADIOLOGY REPORT*  Clinical Data: Postop  PORTABLE LEFT KNEE - 1-2 VIEW  Comparison: None.  Findings: The patient is status post left total knee replacement. Satisfactory position and alignment of femoral and tibial components without adverse features.  Subcutaneous emphysema is an expected postoperative finding.  IMPRESSION: Satisfactory appearance status post left TKR.  Original Report Authenticated By: Elsie Stain, M.D.    Assessment/Plan: Principal Problem:  *Osteoarthritis of left knee Active Problems:  Acute blood loss anemia  Hyponatremia  Observe abla and hyponatremia.  Continue PT/OT WBAT May DC knee immobilizer and continue CPM Continue NPO until patient has a bowel movement due to previous history of Ileus and SBO Plan DC to Home Friday.  [name] 06/22/2011, 8:24 AM

## 2011-06-22 NOTE — Progress Notes (Signed)
Physical Therapy Treatment Note   06/22/11 1412  PT Visit Information  Last PT Received On 06/22/11  Precautions  Precautions Knee  Restrictions  LLE Weight Bearing WBAT  Bed Mobility  Sit to Supine 4: Min assist;HOB flat  Sit to Supine - Details (indicate cue type and reason) assist for L LE, v/c's for technique  Transfers  Sit to Stand 4: Min assist (contact guard)  Sit to Stand Details (indicate cue type and reason) v/c's for R LE management due to L LE in KI  Ambulation/Gait  Ambulation/Gait Assistance 4: Min assist (contact guard)  Ambulation/Gait Assistance Details (indicate cue type and reason) freq directional verbal cues for proper sequencing  Ambulation Distance (Feet) 100 Feet  Assistive device Rolling walker  Gait Pattern Step-to pattern;Decreased step length - left;Decreased stance time - left;Antalgic  Gait velocity slow  Stairs No  Wheelchair Mobility  Wheelchair Mobility No  Posture/Postural Control  Posture/Postural Control No significant limitations  Balance  Balance Assessed Yes  Static Standing Balance  Static Standing - Balance Support Right upper extremity supported  Static Standing - Level of Assistance 4: Min assist  Static Standing - Comment/# of Minutes pt attempted to try and urinate standing at [facility] however unsuccessful. pt required contact guard due to inablility to maintain balance without bilat UE support  Exercises  Exercises (pt reports completing HEP 2x since PT eval)  PT - End of Session  Equipment Utilized During Treatment Gait belt;Left knee immobilizer  Activity Tolerance Patient tolerated treatment well  Patient left in bed (CPM 0-50 deg)  Nurse Communication Mobility status for transfers;Mobility status for ambulation  General  Behavior During Session [facility] for tasks performed  Cognition [facility] for tasks performed  PT - Assessment/Plan  Comments on Treatment Session Patient with improved ambulation tolerance however can be impulsive  and requires freq v/c's for safety and sequencing with RW.   PT Frequency 7X/week  Follow Up Recommendations Home health PT;Supervision/Assistance - 24 hour  Equipment Recommended Tub/shower seat  Acute Rehab PT Goals  PT Goal: Supine/Side to Sit - Progress Progressing toward goal  PT Goal: Sit to Stand - Progress Progressing toward goal  PT Goal: Ambulate - Progress Progressing toward goal  PT Goal: Perform Home Exercise Program - Progress Progressing toward goal    Pain: pt 6/10 L knee pain, RN provided pain medicine during tx  Lewis Shock, PT, DPT Pager #: [phone] Office #: [phone]

## 2011-06-22 NOTE — Progress Notes (Signed)
UR COMPLETED  
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
## 2011-06-23 ENCOUNTER — Encounter (HOSPITAL_COMMUNITY): Payer: Self-pay | Admitting: General Practice

## 2011-06-23 LAB — CBC
Hemoglobin: 10.9 g/dL — ABNORMAL LOW (ref 13.0–17.0)
MCH: 31.1 pg (ref 26.0–34.0)
MCHC: 33.2 g/dL (ref 30.0–36.0)
Platelets: 197 10*3/uL (ref 150–400)
RBC: 3.5 MIL/uL — ABNORMAL LOW (ref 4.22–5.81)

## 2011-06-23 NOTE — Progress Notes (Signed)
Physical Therapy Treatment Note   06/23/11 1353  PT Visit Information  Last PT Received On 06/23/11  Precautions  Precautions Knee  Restrictions  LLE Weight Bearing WBAT  Bed Mobility  Sit to Supine 4: Min assist;HOB flat  Sit to Supine - Details (indicate cue type and reason) assist  for L LE, v/c's for technique  Transfers  Sit to Stand 5: Supervision  Sit to Stand Details (indicate cue type and reason) increased time  Ambulation/Gait  Ambulation/Gait Assistance 4: Min assist (contact guard)  Ambulation/Gait Assistance Details (indicate cue type and reason) v/c's to achieve terminal knee ext for weight-bearing  Ambulation Distance (Feet) 200 Feet  Assistive device Rolling walker  Gait Pattern Step-to pattern;Decreased step length - left;Decreased stance time - left  Gait velocity increased time required, pt easily distracted requiring freq re-direction  Stairs No  Total Joint Exercises  Knee Flexion AROM;Left;10 reps;Seated  Long Arc Quad AROM;Left;10 reps;Seated  PT - End of Session  Equipment Utilized During Treatment Gait belt  Activity Tolerance Patient tolerated treatment well  Patient left in bed;in CPM;with call bell in reach (CPM set 0-65, RN aware)  Nurse Communication (RN made aware of skin tear on posterior thigh)  General  Behavior During Session [facility] for tasks performed  Cognition [facility] for tasks performed  PT - Assessment/Plan  Comments on Treatment Session Patient con't to have decreased quad strength and relies heavily on UE wbing.   PT Plan Discharge plan remains appropriate;Frequency remains appropriate  PT Frequency 7X/week  Follow Up Recommendations No PT follow up;Supervision/Assistance - 24 hour  Equipment Recommended Tub/shower bench  Acute Rehab PT Goals  PT Goal: Supine/Side to Sit - Progress Progressing toward goal  PT Goal: Sit to Stand - Progress Progressing toward goal  PT Goal: Ambulate - Progress Progressing toward goal  PT Goal: Up/Down  Stairs - Progress Progressing toward goal  PT Goal: Perform Home Exercise Program - Progress Progressing toward goal    Pain: 8/10,RN provided pain meds at end of session  Lewis Shock, PT, DPT Pager #: [phone] Office #: [phone]

## 2011-06-23 NOTE — Progress Notes (Signed)
Occupational Therapy Treatment Patient Details Name: [name] [name] MRN: [id number] DOB: Jan 08, 1951 Today's Date: 06/23/2011  OT Assessment/Plan OT Assessment/Plan Comments on Treatment Session: Pt appears to be progressing nicely.  Tub transfer to be deferred to [facility] therapies as pt. unable to accurately describe tub DME he currently has, and demonstrates decreased understanding of safety issues associated. OT Plan: Discharge plan remains appropriate Follow Up Recommendations: No OT follow up (Will have HHPT) Equipment Recommended: None recommended by OT OT Goals ADL Goals ADL Goal: Tub/Shower Transfer - Progress: Discontinued (comment) (Defer to HHPT)  OT Treatment Precautions/Restrictions  Precautions Precautions: Knee Restrictions Weight Bearing Restrictions: Yes LLE Weight Bearing: Weight bearing as tolerated   ADL ADL ADL Comments: After speaking with case manager, pt. insurance will not reimburse tub DME.  Spoke with pt. re: this, and he does not wish to purchase a tub transfer bench.  He does state he has a tub seat with a back, but has difficulty accurately describing seat.  Spoke with pt. re: safety issues with tub transfer, and recommend that he address tub transfers with [facility] therapies, and sponge bathe in the meantime.  Pt. agreeable to this plan. Mobility  Sit to Stand: 5: Supervision Sit to Stand Details (indicate cue type and reason): increased time End of Session OT - End of Session Patient left:  (with PT) General Behavior During Session: [facility] for tasks performed Cognition:  (Pt. slow to process information)  Jeraldine Primeau, Ursula Alert M  06/23/2011, 5:15 PM

## 2011-06-23 NOTE — Progress Notes (Signed)
Physical Therapy Treatment Note   06/23/11 1103  PT Visit Information  Last PT Received On 06/23/11  Precautions  Precautions Knee  Restrictions  LLE Weight Bearing WBAT  Bed Mobility  Supine to Sit 4: Min assist  Supine to Sit Details (indicate cue type and reason) able to complete task without use of bed rail. assist only for L LE  Transfers  Sit to Stand 5: Supervision  Sit to Stand Details (indicate cue type and reason) increased time required, v/c's for safety  Ambulation/Gait  Ambulation/Gait Assistance 4: Min assist (contact guard)  Ambulation/Gait Assistance Details (indicate cue type and reason) freq v/c's for walker sequencing  Ambulation Distance (Feet) 100 Feet  Assistive device Rolling walker  Gait Pattern Step-to pattern;Decreased step length - left;Decreased stance time - left  Gait velocity increased time required  Stairs Yes  Stairs Assistance 4: Min assist  Stairs Assistance Details (indicate cue type and reason) max directional verbal cues  Stair Management Technique Two rails  Number of Stairs 2   Wheelchair Mobility  Wheelchair Mobility No  Balance  Balance Assessed Yes  Static Standing Balance  Static Standing - Balance Support No upper extremity supported  Static Standing - Level of Assistance 4: Min assist (contact guard)  Static Standing - Comment/# of Minutes pt attempted to urinate standing at [facility]  Total Joint Exercises  Quad Sets AROM;Left;20 reps;Supine  Knee Flexion AROM;Left;10 reps;Seated  Long Arc Quad AROM;Left;10 reps;Seated  PT - End of Session  Equipment Utilized During Treatment Gait belt  Activity Tolerance Patient tolerated treatment well  Patient left in chair;with call bell in reach  Nurse Communication Mobility status for transfers;Mobility status for ambulation  General  Behavior During Session [facility] for tasks performed  Cognition [facility] for tasks performed  PT - Assessment/Plan  Comments on Treatment Session Patient con't to  require max encouragement to participate in PT. Patient doing well but con't to require supervision for safety due to increased fall risk.   PT Plan Discharge plan remains appropriate;Frequency remains appropriate  PT Frequency 7X/week  Follow Up Recommendations Home health PT;Supervision/Assistance - 24 hour  Equipment Recommended Tub/shower bench  Acute Rehab PT Goals  PT Goal: Supine/Side to Sit - Progress Progressing toward goal  PT Goal: Sit to Stand - Progress Progressing toward goal  PT Goal: Ambulate - Progress Progressing toward goal  PT Goal: Up/Down Stairs - Progress Progressing toward goal  PT Goal: Perform Home Exercise Program - Progress Progressing toward goal    Pain: 8/10, RN provided pain medication at end of treatment  Lewis Shock, PT, DPT Pager #: [phone] Office #: [phone]

## 2011-06-23 NOTE — Progress Notes (Signed)
Assessment complete, see for details. Patient resting quietly in bed, no acute distress noted. Patient denies needs, concerns.

## 2011-06-23 NOTE — Progress Notes (Signed)
     2 Days Post-Op   Subjective:  Patient reports pain as moderate.  Positive flatus, no bm.  Doing well with PT.  Objective:   VITALS:   Filed Vitals:   06/22/11 0558 06/22/11 1400 06/22/11 2119 06/23/11 0644  BP: 121/63 119/73 124/72 145/77  Pulse: 82 81 80 82  Temp: 101.1 F (38.4 C) 99.4 F (37.4 C) 99.3 F (37.4 C) 98.7 F (37.1 C)  TempSrc:   Oral   Resp: 18 18 18 20   SpO2: 95% 94% 93% 90%    Neurologically intact ABD soft Dorsiflexion/Plantar flexion intact Incision: dressing C/D/I  LABS Lab Results  Component Value Date   HGB 10.9* 06/23/2011   HGB 11.6* 06/22/2011   HGB 15.4 06/15/2011   CBC    Component Value Date/Time   WBC 7.1 06/23/2011 0635   WBC 6.9 10/07/2010 1044   RBC 3.50* 06/23/2011 0635   RBC 4.47 10/07/2010 1044   HGB 10.9* 06/23/2011 0635   HGB 14.9 10/07/2010 1044   HCT 32.8* 06/23/2011 0635   HCT 43.8 10/07/2010 1044   PLT 197 06/23/2011 0635   PLT 192 10/07/2010 1044   MCV 93.7 06/23/2011 0635   MCV 98.0 10/07/2010 1044   MCH 31.1 06/23/2011 0635   MCH 33.3 10/07/2010 1044   MCHC 33.2 06/23/2011 0635   MCHC 34.0 10/07/2010 1044   RDW 13.9 06/23/2011 0635   RDW 14.2 10/07/2010 1044   LYMPHSABS 2.6 01/24/2011 0500   LYMPHSABS 2.3 10/07/2010 1044   MONOABS 1.3* 01/24/2011 0500   MONOABS 0.4 10/07/2010 1044   EOSABS 0.4 01/24/2011 0500   EOSABS 0.2 10/07/2010 1044   BASOSABS 0.1 01/24/2011 0500   BASOSABS 0.0 10/07/2010 1044    Lab Results  Component Value Date   INR 1.03 06/15/2011   Lab Results  Component Value Date   NA 132* 06/22/2011   K 3.7 06/22/2011   CL 101 06/22/2011   CO2 24 06/22/2011   BUN 12 06/22/2011   CREATININE 1.19 06/22/2011   GLUCOSE 92 06/22/2011   Dg Chest 2 View  06/15/2011  *RADIOLOGY REPORT*  Clinical Data: Hypertension, tobacco use, preop knee arthroplasty  CHEST - 2 VIEW  Comparison: 01/21/2011  Findings: Mildly tortuous thoracic aorta. Lungs clear.  Heart size and pulmonary vascularity normal.  No effusion.   Visualized bones unremarkable.  IMPRESSION: No acute disease  Original Report Authenticated By: Osa Craver, M.D.   X-ray Knee Left Port  06/21/2011  *RADIOLOGY REPORT*  Clinical Data: Postop  PORTABLE LEFT KNEE - 1-2 VIEW  Comparison: None.  Findings: The patient is status post left total knee replacement. Satisfactory position and alignment of femoral and tibial components without adverse features.  Subcutaneous emphysema is an expected postoperative finding.  IMPRESSION: Satisfactory appearance status post left TKR.  Original Report Authenticated By: Elsie Stain, M.D.    Assessment/Plan: Principal Problem:  *Osteoarthritis of left knee Active Problems:  Acute blood loss anemia  Hyponatremia   Advance diet Up with therapy Plan for discharge tomorrow  Observe hyponatremia and ABLA.  Begin clear liquids today.  Possible reg diet tomorrow.  DC home tomorrow if BM and tolerating diet.   Nevin Grizzle P 06/23/2011, 9:01 AM

## 2011-06-24 LAB — CBC
HCT: 30.8 % — ABNORMAL LOW (ref 39.0–52.0)
MCHC: 33.8 g/dL (ref 30.0–36.0)
MCV: 91.9 fL (ref 78.0–100.0)
Platelets: 200 10*3/uL (ref 150–400)
RDW: 13.8 % (ref 11.5–15.5)

## 2011-06-24 NOTE — Progress Notes (Signed)
Patient ID: [name] [name], male   DOB: 07-06-1950, 61 y.o.   MRN: [id number]     3 Days Post-Op   Subjective:  Patient reports pain as mild to moderate.  Patient states that he is doing much better and has had a bowel movement.  He denies any CP or SOB.    Objective:   VITALS:   Filed Vitals:   06/23/11 0644 06/23/11 1500 06/23/11 2150 06/24/11 0524  BP: 145/77  179/90 138/83  Pulse: 82  85 66  Temp: 98.7 F (37.1 C)  98.2 F (36.8 C) 98.1 F (36.7 C)  TempSrc:      Resp: 20  20 20   Height:  5\' 11"  (1.803 m)    Weight:  89.812 kg (198 lb)    SpO2: 90%  98% 99%    ABD soft Sensation intact distally Dorsiflexion/Plantar flexion intact Incision: dressing C/D/I  LABS Lab Results  Component Value Date   HGB 10.4* 06/24/2011   HGB 10.9* 06/23/2011   HGB 11.6* 06/22/2011   CBC    Component Value Date/Time   WBC 7.2 06/24/2011 0525   WBC 6.9 10/07/2010 1044   RBC 3.35* 06/24/2011 0525   RBC 4.47 10/07/2010 1044   HGB 10.4* 06/24/2011 0525   HGB 14.9 10/07/2010 1044   HCT 30.8* 06/24/2011 0525   HCT 43.8 10/07/2010 1044   PLT 200 06/24/2011 0525   PLT 192 10/07/2010 1044   MCV 91.9 06/24/2011 0525   MCV 98.0 10/07/2010 1044   MCH 31.0 06/24/2011 0525   MCH 33.3 10/07/2010 1044   MCHC 33.8 06/24/2011 0525   MCHC 34.0 10/07/2010 1044   RDW 13.8 06/24/2011 0525   RDW 14.2 10/07/2010 1044   LYMPHSABS 2.6 01/24/2011 0500   LYMPHSABS 2.3 10/07/2010 1044   MONOABS 1.3* 01/24/2011 0500   MONOABS 0.4 10/07/2010 1044   EOSABS 0.4 01/24/2011 0500   EOSABS 0.2 10/07/2010 1044   BASOSABS 0.1 01/24/2011 0500   BASOSABS 0.0 10/07/2010 1044    Lab Results  Component Value Date   INR 1.03 06/15/2011   Lab Results  Component Value Date   NA 132* 06/22/2011   K 3.7 06/22/2011   CL 101 06/22/2011   CO2 24 06/22/2011   BUN 12 06/22/2011   CREATININE 1.19 06/22/2011   GLUCOSE 92 06/22/2011   Dg Chest 2 View  06/15/2011  *RADIOLOGY REPORT*  Clinical Data: Hypertension, tobacco use, preop knee  arthroplasty  CHEST - 2 VIEW  Comparison: 01/21/2011  Findings: Mildly tortuous thoracic aorta. Lungs clear.  Heart size and pulmonary vascularity normal.  No effusion.  Visualized bones unremarkable.  IMPRESSION: No acute disease  Original Report Authenticated By: Osa Craver, M.D.   X-ray Knee Left Port  06/21/2011  *RADIOLOGY REPORT*  Clinical Data: Postop  PORTABLE LEFT KNEE - 1-2 VIEW  Comparison: None.  Findings: The patient is status post left total knee replacement. Satisfactory position and alignment of femoral and tibial components without adverse features.  Subcutaneous emphysema is an expected postoperative finding.  IMPRESSION: Satisfactory appearance status post left TKR.  Original Report Authenticated By: Elsie Stain, M.D.    Assessment/Plan: Principal Problem:  *Osteoarthritis of left knee Active Problems:  Acute blood loss anemia  Hyponatremia   Advance diet Up with therapy Discharge home with home health   Haskel Khan 06/24/2011, 9:10 AM

## 2011-06-24 NOTE — Progress Notes (Signed)
Physical Therapy Treatment Note   06/24/11 1011  PT Visit Information  Last PT Received On 06/24/11  Precautions  Precautions Knee  Restrictions  LLE Weight Bearing WBAT  Bed Mobility  Supine to Sit 5: Supervision  Sit to Supine 4: Min assist  Sit to Supine - Details (indicate cue type and reason) minA for L LE management  Transfers  Sit to Stand 5: Supervision  Ambulation/Gait  Ambulation/Gait Assistance 5: Supervision  Ambulation/Gait Assistance Details (indicate cue type and reason) max verbal cues to achieve terminal L knee ext during stance phase  Ambulation Distance (Feet) 150 Feet  Assistive device Rolling walker  Gait Pattern Step-to pattern;Decreased step length - left;Decreased stance time - left  Gait velocity slow  Stairs Assistance 4: Min assist (contact guard)  Stairs Assistance Details (indicate cue type and reason) pt with safe technique  Stair Management Technique Two rails  Number of Stairs 2   Total Joint Exercises  Quad Sets AROM;Left;20 reps;Supine  Knee Flexion AROM;Left;10 reps;Seated  Long Arc Quad AROM;Left;10 reps;Seated  Marching in Standing AROM;Left;10 reps;Standing  PT - End of Session  Equipment Utilized During Treatment Gait belt  Activity Tolerance Patient tolerated treatment well  Patient left in bed;in CPM;with call bell in reach (CPM set 0-75 deg)  Nurse Communication Mobility status for transfers;Mobility status for ambulation  General  Behavior During Session [facility] for tasks performed  Cognition [facility] for tasks performed  PT - Assessment/Plan  Comments on Treatment Session Patient remains safe to return home with minimal assist for ADLs and L LE management for transfers. Patient con't to have decreased L quad strength but con't to progress active L knee flex  PT Plan Discharge plan remains appropriate;Frequency remains appropriate  PT Frequency 7X/week  Follow Up Recommendations Home health PT;Supervision/Assistance - 24 hour  Equipment  Recommended None recommended by PT  Acute Rehab PT Goals  PT Goal: Supine/Side to Sit - Progress Progressing toward goal  PT Goal: Sit to Stand - Progress Progressing toward goal  PT Goal: Ambulate - Progress Progressing toward goal  PT Goal: Up/Down Stairs - Progress Progressing toward goal  PT Goal: Perform Home Exercise Program - Progress Progressing toward goal    Pain: 6/10 L knee pain. RN aware however not time for pain medicien  Lewis Shock, PT, DPT Pager #: [phone] Office #: [phone]

## 2011-06-24 NOTE — Discharge Summary (Signed)
Physician Discharge Summary  Patient ID: [name] [name] MRN: [id number] DOB/AGE: [birth year] 61 y.o.  Admit date: 06/21/2011 Discharge date: 06/24/2011  Admission Diagnoses:  Osteoarthritis of left knee  Discharge Diagnoses:  Principal Problem:  *Osteoarthritis of left knee Active Problems:  Acute blood loss anemia  Hyponatremia   Past Medical History  Diagnosis Date  . Hypertension   . Shortness of breath   . Hepatitis     c  . Osteoarthritis of left knee 06/21/2011    Surgeries: Procedure(s): TOTAL KNEE ARTHROPLASTY on 06/21/2011   Consultants (if any):    Discharged Condition: Improved  Hospital Course: [name] [name] is an 61 y.o. male who was admitted 06/21/2011 with a diagnosis of Osteoarthritis of left knee and went to the operating room on 06/21/2011 and underwent the above named procedures.    He was given perioperative antibiotics:  Anti-infectives     Start     Dose/Rate Route Frequency Ordered Stop   06/21/11 1400   ceFAZolin (ANCEF) IVPB 1 g/50 mL premix        1 g 100 mL/hr over 30 Minutes Intravenous Every 6 hours 06/21/11 1156 06/22/11 0200   06/20/11 1408   ceFAZolin (ANCEF) IVPB 2 g/50 mL premix        2 g 100 mL/hr over 30 Minutes Intravenous 60 min pre-op 06/20/11 1408 06/21/11 0800        .  He was given sequential compression devices, early ambulation, and chemoprophylaxis for DVT prophylaxis. Given his history of severe ileus, and previous bowel surgeries, kept him n.p.o. for 2 days after surgery, and when his bowel function returned we advanced his diet. He was tolerating regular diet and having bowel movements at the time of discharge.  He benefited maximally from the hospital stay and there were no complications.    Recent vital signs:  Filed Vitals:   06/24/11 0524  BP: 138/83  Pulse: 66  Temp: 98.1 F (36.7 C)  Resp: 20    Recent laboratory studies:  Lab Results  Component Value Date   HGB 10.4* 06/24/2011   HGB 10.9*  06/23/2011   HGB 11.6* 06/22/2011   Lab Results  Component Value Date   WBC 7.2 06/24/2011   PLT 200 06/24/2011   Lab Results  Component Value Date   INR 1.03 06/15/2011   Lab Results  Component Value Date   NA 132* 06/22/2011   K 3.7 06/22/2011   CL 101 06/22/2011   CO2 24 06/22/2011   BUN 12 06/22/2011   CREATININE 1.19 06/22/2011   GLUCOSE 92 06/22/2011    Discharge Medications:   Medication List  As of 06/24/2011  1:40 PM   TAKE these medications         atorvastatin 10 MG tablet   Commonly known as: LIPITOR   Take 10 mg by mouth daily.      enoxaparin 40 MG/0.4ML injection   Commonly known as: LOVENOX   Inject 0.4 mLs (40 mg total) into the skin daily.      lisinopril 20 MG tablet   Commonly known as: PRINIVIL,ZESTRIL   Take 20 mg by mouth daily.      methocarbamol 500 MG tablet   Commonly known as: ROBAXIN   Take 1 tablet (500 mg total) by mouth 4 (four) times daily.      oxyCODONE-acetaminophen 10-325 MG per tablet   Commonly known as: PERCOCET   Take 1-2 tablets by mouth every 6 (six) hours as needed for pain.  MAXIMUM TOTAL ACETAMINOPHEN DOSE IS 4000 MG PER DAY      promethazine 25 MG tablet   Commonly known as: PHENERGAN   Take 1 tablet (25 mg total) by mouth every 6 (six) hours as needed for nausea.      traMADol 50 MG tablet   Commonly known as: ULTRAM   Take 50 mg by mouth every 8 (eight) hours as needed. For pain      traZODone 100 MG tablet   Commonly known as: DESYREL   Take 100 mg by mouth at bedtime.            Diagnostic Studies: Dg Chest 2 View  06/15/2011  *RADIOLOGY REPORT*  Clinical Data: Hypertension, tobacco use, preop knee arthroplasty  CHEST - 2 VIEW  Comparison: 01/21/2011  Findings: Mildly tortuous thoracic aorta. Lungs clear.  Heart size and pulmonary vascularity normal.  No effusion.  Visualized bones unremarkable.  IMPRESSION: No acute disease  Original Report Authenticated By: Osa Craver, M.D.   X-ray Knee Left  Port  06/21/2011  *RADIOLOGY REPORT*  Clinical Data: Postop  PORTABLE LEFT KNEE - 1-2 VIEW  Comparison: None.  Findings: The patient is status post left total knee replacement. Satisfactory position and alignment of femoral and tibial components without adverse features.  Subcutaneous emphysema is an expected postoperative finding.  IMPRESSION: Satisfactory appearance status post left TKR.  Original Report Authenticated By: Elsie Stain, M.D.    Disposition: home  Discharge Orders    Future Orders Please Complete By Expires   Diet general      Call MD / Call 911      Comments:   If you experience chest pain or shortness of breath, CALL 911 and be transported to the hospital emergency room.  If you develope a fever above 101 F, pus (white drainage) or increased drainage or redness at the wound, or calf pain, call your surgeon's office.   Discharge instructions      Comments:   Change dressing in 3 days and reapply fresh dressing, unless you have a splint (half cast).  If you have a splint/cast, just leave in place until your follow-up appointment.    Keep wounds dry for 3 weeks.  Leave steri-strips in place on skin.  Do not apply lotion or anything to the wound.   Constipation Prevention      Comments:   Drink plenty of fluids.  Prune juice may be helpful.  You may use a stool softener, such as Colace (over the counter) 100 mg twice a day.  Use MiraLax (over the counter) for constipation as needed.   Weight Bearing as taught in Physical Therapy      Comments:   Use a walker or crutches as instructed.   TED hose      Comments:   Use stockings (TED hose) for 2 weeks on both leg(s).  You may remove them at night for sleeping.   Change dressing      Comments:   Change dressing in three days, then change the dressing daily with sterile 4 x 4 inch gauze dressing.  You may clean the incision with alcohol prior to redressing.   Do not put a pillow under the knee. Place it under the heel.          Follow-up Information    Follow up with Tayli Buch P, MD in 2 weeks.   Contact information:   [facility] [address]  [place] [address] [phone]           Signed: Eulas Post 06/24/2011, 1:40 PM

## 2011-09-07 ENCOUNTER — Telehealth: Payer: Self-pay | Admitting: Oncology

## 2011-09-07 NOTE — Telephone Encounter (Signed)
S/w pt re appt for 8/6.

## 2011-10-18 ENCOUNTER — Ambulatory Visit (HOSPITAL_BASED_OUTPATIENT_CLINIC_OR_DEPARTMENT_OTHER): Payer: Medicaid Other | Admitting: Family

## 2011-10-18 ENCOUNTER — Other Ambulatory Visit (HOSPITAL_BASED_OUTPATIENT_CLINIC_OR_DEPARTMENT_OTHER): Payer: Medicaid Other | Admitting: Lab

## 2011-10-18 ENCOUNTER — Encounter: Payer: Self-pay | Admitting: Family

## 2011-10-18 VITALS — BP 163/105 | HR 59 | Temp 97.3°F | Resp 18 | Ht 71.0 in | Wt 204.3 lb

## 2011-10-18 DIAGNOSIS — C187 Malignant neoplasm of sigmoid colon: Secondary | ICD-10-CM

## 2011-10-18 DIAGNOSIS — R350 Frequency of micturition: Secondary | ICD-10-CM

## 2011-10-18 DIAGNOSIS — C189 Malignant neoplasm of colon, unspecified: Secondary | ICD-10-CM

## 2011-10-18 DIAGNOSIS — R5383 Other fatigue: Secondary | ICD-10-CM

## 2011-10-18 LAB — COMPREHENSIVE METABOLIC PANEL
BUN: 11 mg/dL (ref 6–23)
CO2: 26 mEq/L (ref 19–32)
Calcium: 9.7 mg/dL (ref 8.4–10.5)
Chloride: 103 mEq/L (ref 96–112)
Creatinine, Ser: 1.08 mg/dL (ref 0.50–1.35)
Glucose, Bld: 83 mg/dL (ref 70–99)
Total Bilirubin: 0.5 mg/dL (ref 0.3–1.2)

## 2011-10-18 LAB — LACTATE DEHYDROGENASE: LDH: 144 U/L (ref 94–250)

## 2011-10-18 LAB — CBC WITH DIFFERENTIAL/PLATELET
MCH: 32.3 pg (ref 27.2–33.4)
MCV: 97.5 fL (ref 79.3–98.0)
MONO%: 7.2 % (ref 0.0–14.0)
RBC: 4.6 10*6/uL (ref 4.20–5.82)
RDW: 13.6 % (ref 11.0–14.6)

## 2011-10-18 NOTE — Patient Instructions (Addendum)
Patient ID: [name] [name],   DOB: March 05, 1951,  MRN: [id number]   [facility] Discharge Instructions  RECOMMENDATIONS MAD BY THE CONSULTANT AND ANY TEST RESULT(S) WILL BE FORWARDED TO YOU REFERRING DOCTOR   EXAM FINDINGS BY NURSE PRACTITIONER TODAY TO REPORT TO THE CLINIC OR PRIMARY PROVIDER:  BP was 163/105 and 155/98 on two separate readings.   Your Current Medications Are: Current Outpatient Prescriptions  Medication Sig Dispense Refill  . atorvastatin (LIPITOR) 10 MG tablet Take 10 mg by mouth daily.        Marland Kitchen lisinopril (PRINIVIL,ZESTRIL) 20 MG tablet Take 20 mg by mouth daily.        . traMADol (ULTRAM) 50 MG tablet Take 50 mg by mouth every 8 (eight) hours as needed. For pain      . traZODone (DESYREL) 100 MG tablet Take 100 mg by mouth at bedtime.      . promethazine (PHENERGAN) 25 MG tablet Take 1 tablet (25 mg total) by mouth every 6 (six) hours as needed for nausea.  30 tablet  0  . DISCONTD: promethazine (PHENERGAN) 25 MG tablet Take 1 tablet (25 mg total) by mouth every 6 (six) hours as needed for nausea.  30 tablet  0     INSTRUCTIONS GIVEN, DISCUSSED AND FOLLOW-UP: ITEMS TO FOLLOW-UP ON: 1. Blood pressure - please check at least 2 more times this week, write the readings down and follow up with you PCP as soon as possible regarding elevated blood pressure readings. 2. Increase your fiber and water intake.  Eat leafy green vegetables, fruit, oatmeal, prunes and take Miralax if constipation continues 3.  PLEASE GET A COLONOSCOPY THIS YEAR - DR. MAGOD  4. Check a hepatitis panel with you PCP 5.  Follow up with Urologist for a prostate exam and urinary frequency/urgency issues  I acknowledge that I have been informed and understand all the instructions given to me and have received a copy.  I do not have any further questions at this time, but I understand that I may call the [facility] at [phone] during business hours should I have any  further questions or need assistance in obtaining follow-up care.   10/18/2011, 3:46 PM

## 2011-10-18 NOTE — Progress Notes (Signed)
Patient ID: [name] [name], male   DOB: 05/11/1950, 61 y.o.   MRN: [id number] CSN: [id number]  CC:  [name] Kuba, MD [name] [name] Sharyn Lull, MD   Problem List: [name] [name] is a 61 y.o. African-American male with a problem list consisting of:  1. [name] [name] is a 61 year old African American male with stage II T3 N0 adenocarcinoma of the sigmoid colon status post left hemicolectomy in 2002, followed by adjuvant chemotherapy until August 2003. His last colonoscopy 11/01/2007 revealed polyps which were biopsied. Surgical pathology of the cecal and transverse polyps revealed fragments of tubular adenoma, hyperplastic polyp, high-grade dysplasia was not identified. 2. The patient did have a CT scan of the abdomen and pelvis with contrast on 10/19/2009 for evaluation of left flank pain. This revealed a left-sided pelvocaliectasis with prominence of stranding about the proximal left ureter and an obstructing 8 x 5 mm stone in the proximal left ureter 5 cm below the left renal pelvis. No significant hydronephrosis was seen, likely small exophytic right renal cyst and bibasilar atelectasis noted. 3. Hypertension 4. Hepatitis C 5. Bilateral total knee replacements (last knee replacement was 07/01/2011) 6. Osteoarthritis of the left knee 7. SBO  The patient presents today complaining of constipation x 1 month despite taking 2 Dulcolax per day for the past 5 days.  The patient also states that he has urinary urgency and leakage for the past 2 months.  The patient reports feeling fatigued. The patient denies seeing any unusual blood or bleeding.  The patient's blood pressure is elevated today with two separate readings of 163/105 and 155/98 respectively.  The patient has not had a colonoscopy sin 2009, and does not remember receiving the influenza or pneumonia vaccines.  The patient has not been able to schedule an appointment with the Hepatologist to monitor his hepatitis C.  Smoking cessation was discussed  with the patient today.  Dr. Arline Asp visited with the patient for a while today also.    Past Medical History: Past Medical History  Diagnosis Date  . Hypertension   . Shortness of breath   . Hepatitis     c  . Osteoarthritis of left knee 06/21/2011    Surgical History: Past Surgical History  Procedure Date  . Cardiac catheterization     2005   dr [name] [name]  . Joint replacement     rt knee  . Colon surgery     2002   colon  ca   dr [name] [name]      chemo  tx  . Replacement total knee     bilateral knee replacements    Current Medications: Current Outpatient Prescriptions  Medication Sig Dispense Refill  . atorvastatin (LIPITOR) 10 MG tablet Take 10 mg by mouth daily.        Marland Kitchen lisinopril (PRINIVIL,ZESTRIL) 20 MG tablet Take 20 mg by mouth daily.        . traMADol (ULTRAM) 50 MG tablet Take 50 mg by mouth every 8 (eight) hours as needed. For pain      . traZODone (DESYREL) 100 MG tablet Take 100 mg by mouth at bedtime.      . promethazine (PHENERGAN) 25 MG tablet Take 1 tablet (25 mg total) by mouth every 6 (six) hours as needed for nausea.  30 tablet  0  . DISCONTD: promethazine (PHENERGAN) 25 MG tablet Take 1 tablet (25 mg total) by mouth every 6 (six) hours as needed for nausea.  30 tablet  0    Allergies:  Allergies  Allergen Reactions  . Sulfonamide Derivatives Rash and Other (See Comments)    REACTION: penile irritation    Family History: No family history on file.  Social History: History  Substance Use Topics  . Smoking status: Current Some Day Smoker -- 0.2 packs/day for 5 years    Types: Cigarettes  . Smokeless tobacco: Not on file  . Alcohol Use: 3.0 oz/week    5 Cans of beer per week     oct   2012    Review of Systems: 10 Point review of systems was completed and is negative except as noted above.   Physical Exam:   Blood pressure 163/105, pulse 59, temperature 97.3 F (36.3 C), temperature source Oral, resp. rate 18, height 5\' 11"  (1.803 m), weight  204 lb 4.8 oz (92.67 kg).  General appearance: Alert, cooperative, well nourished, well developed, no apparent distress Head: Normocephalic, without obvious abnormality, atraumatic Eyes: Conjunctivae/corneas clear,  PERRLA, EOMI, mild scleral icterus Nose: Nares, septum and mucosa are normal, no drainage or sinus tenderness Throat: Lips, mucosa, and tongue are normal, gums are normal, upper and lower dentures Resp: Diminished breath sounds bibasilar, CTA Cardio: Regular rate and rhythm, S1, S2 normal, no murmur, click, rub or gallop GI: Soft, non-tender, non-distended, hypoactive bowel sounds, no masses,  no organomegaly, well healed surgical scars Extremities: Extremities normal, atraumatic, no cyanosis or edema Lymph nodes: Cervical, supraclavicular, and axillary nodes normal Neurologic: Grossly normal  Laboratory Data: Results for orders placed in visit on 10/18/11 (from the past 48 hour(s))  CBC WITH DIFFERENTIAL     Status: Normal   Collection Time   10/18/11  2:24 PM      Component Value Range Comment   WBC 6.3  4.0 - 10.3 10e3/uL    NEUT# 3.1  1.5 - 6.5 10e3/uL    HGB 14.8  13.0 - 17.1 g/dL    HCT 16.1  09.6 - 04.5 %    Platelets 214  140 - 400 10e3/uL    MCV 97.5  79.3 - 98.0 fL    MCH 32.3  27.2 - 33.4 pg    MCHC 33.1  32.0 - 36.0 g/dL    RBC 4.09  8.11 - 9.14 10e6/uL    RDW 13.6  11.0 - 14.6 %    lymph# 2.6  0.9 - 3.3 10e3/uL    MONO# 0.5  0.1 - 0.9 10e3/uL    Eosinophils Absolute 0.1  0.0 - 0.5 10e3/uL    Basophils Absolute 0.1  0.0 - 0.1 10e3/uL    NEUT% 48.2  39.0 - 75.0 %    LYMPH% 41.3  14.0 - 49.0 %    MONO% 7.2  0.0 - 14.0 %    EOS% 2.2  0.0 - 7.0 %    BASO% 1.1  0.0 - 2.0 %      Imaging Studies: 1.  Left Knee xray 06/21/2011:  Satisfactory appearance status post left TKR. 2.  CXR 06/15/2011:  Findings: Mildly tortuous thoracic aorta. Lungs clear. Heart size and pulmonary vascularity normal. No effusion. Visualized bones unremarkable. 3.  Xray of abd/pelvis  01/23/2011:  Multiple dilated loops of small bowel, stable, suggesting partial small bowel obstruction. No evidence of free air.  4.  CT of abd/pelvis 10/16/2009:   Left-sided pelvicaliectasis, with prominence and stranding about the proximal left ureter, and an obstructing 8 x 5 mm stone in the proximal left ureter, 5 cm below the left renal pelvis. No significant hydronephrosis yet seen.  Likely  small exophytic right renal cyst.  Bibasilar atelectasis noted.   Impression/Plan: Markez Dowland is a 61 year old African American male with stage II T3 N0 adenocarcinoma of the sigmoid colon status post left hemicolectomy in 2002, followed by adjuvant chemotherapy until August 2003. His last colonoscopy 11/01/2007 revealed polyps which were biopsied. A referral was made to GI for a colonoscopy and constipation.  For the patient's complaint or urinary incontinence and frequency, a referral was made to Urology.  The patient was asked to follow up with his PCP regarding his BP and possibly fatigue.  Fatigue could also be a Urology related issue.  The patient is scheduled for a follow-up with Dr. Arline Asp in 1 year at which time chemistries, LDH and CBC will be checked.  The patient is asked to increase his fiber and fluid intake.  The patient is also asked to continue to attempt to follow-up with his Hepatologist.   Larina Bras  NP-C 10/18/2011, 5:28 PM

## 2011-10-19 ENCOUNTER — Other Ambulatory Visit: Payer: Self-pay | Admitting: Medical Oncology

## 2011-10-19 DIAGNOSIS — R350 Frequency of micturition: Secondary | ICD-10-CM | POA: Insufficient documentation

## 2011-10-19 DIAGNOSIS — C189 Malignant neoplasm of colon, unspecified: Secondary | ICD-10-CM

## 2011-10-27 ENCOUNTER — Telehealth: Payer: Self-pay | Admitting: Oncology

## 2011-10-27 NOTE — Telephone Encounter (Signed)
Called [facility] and [facility] and per both facilities pt will need to call himself and speak with billing office before being given and appt. S/w pt's wife re above and gv wife name and number to both facilities to call for appt. S/w Kim @ [facility] and [facility] @ [facility]. Robin aware and referrals have been cx'd.

## 2011-11-04 ENCOUNTER — Other Ambulatory Visit (HOSPITAL_COMMUNITY): Payer: Self-pay | Admitting: Cardiology

## 2011-11-04 DIAGNOSIS — R079 Chest pain, unspecified: Secondary | ICD-10-CM

## 2011-11-11 ENCOUNTER — Ambulatory Visit (HOSPITAL_COMMUNITY)
Admission: RE | Admit: 2011-11-11 | Discharge: 2011-11-11 | Disposition: A | Discharge status: Home / Self Care | Payer: Medicaid Other | Source: Ambulatory Visit | Attending: Cardiology | Admitting: Cardiology

## 2011-11-11 ENCOUNTER — Other Ambulatory Visit: Payer: Self-pay

## 2011-11-11 ENCOUNTER — Encounter (HOSPITAL_COMMUNITY)
Admission: RE | Admit: 2011-11-11 | Discharge: 2011-11-11 | Disposition: A | Discharge status: Home / Self Care | Payer: Medicaid Other | Source: Ambulatory Visit | Attending: Cardiology | Admitting: Cardiology

## 2011-11-11 DIAGNOSIS — R9439 Abnormal result of other cardiovascular function study: Secondary | ICD-10-CM | POA: Insufficient documentation

## 2011-11-11 DIAGNOSIS — R079 Chest pain, unspecified: Secondary | ICD-10-CM

## 2011-11-11 MED ORDER — TECHNETIUM TC 99M TETROFOSMIN IV KIT
30.0000 | PACK | Freq: Once | INTRAVENOUS | Status: AC | PRN
  Administered 2011-11-11: 30 via INTRAVENOUS

## 2011-11-11 MED ORDER — TECHNETIUM TC 99M TETROFOSMIN IV KIT
10.0000 | PACK | Freq: Once | INTRAVENOUS | Status: AC | PRN
  Administered 2011-11-11: 10 via INTRAVENOUS

## 2011-11-11 MED ORDER — REGADENOSON 0.4 MG/5ML IV SOLN
INTRAVENOUS | Status: AC
  Administered 2011-11-11: 0.4 mg via INTRAVENOUS
  Filled 2011-11-11: qty 5

## 2011-11-11 MED ORDER — REGADENOSON 0.4 MG/5ML IV SOLN
0.4000 mg | Freq: Once | INTRAVENOUS | Status: AC
  Administered 2011-11-11: 0.4 mg via INTRAVENOUS

## 2011-11-21 ENCOUNTER — Encounter (HOSPITAL_COMMUNITY): Payer: Self-pay | Admitting: Pharmacy Technician

## 2011-11-24 ENCOUNTER — Encounter (HOSPITAL_COMMUNITY): Payer: Self-pay | Admitting: *Deleted

## 2011-11-24 ENCOUNTER — Encounter (HOSPITAL_COMMUNITY)
Admission: RE | Disposition: A | Discharge status: Home / Self Care | Payer: Self-pay | Source: Ambulatory Visit | Attending: Cardiology

## 2011-11-24 ENCOUNTER — Ambulatory Visit (HOSPITAL_COMMUNITY)
Admission: RE | Admit: 2011-11-24 | Discharge: 2011-11-25 | Disposition: A | Discharge status: Home / Self Care | Payer: Medicaid Other | Source: Ambulatory Visit | Attending: Cardiology | Admitting: Cardiology

## 2011-11-24 DIAGNOSIS — I10 Essential (primary) hypertension: Secondary | ICD-10-CM | POA: Insufficient documentation

## 2011-11-24 DIAGNOSIS — I2510 Atherosclerotic heart disease of native coronary artery without angina pectoris: Secondary | ICD-10-CM | POA: Insufficient documentation

## 2011-11-24 DIAGNOSIS — Z955 Presence of coronary angioplasty implant and graft: Secondary | ICD-10-CM

## 2011-11-24 DIAGNOSIS — Z9861 Coronary angioplasty status: Secondary | ICD-10-CM | POA: Insufficient documentation

## 2011-11-24 DIAGNOSIS — Z85038 Personal history of other malignant neoplasm of large intestine: Secondary | ICD-10-CM | POA: Insufficient documentation

## 2011-11-24 DIAGNOSIS — Y831 Surgical operation with implant of artificial internal device as the cause of abnormal reaction of the patient, or of later complication, without mention of misadventure at the time of the procedure: Secondary | ICD-10-CM | POA: Insufficient documentation

## 2011-11-24 DIAGNOSIS — T82897A Other specified complication of cardiac prosthetic devices, implants and grafts, initial encounter: Secondary | ICD-10-CM | POA: Insufficient documentation

## 2011-11-24 DIAGNOSIS — E7800 Pure hypercholesterolemia, unspecified: Secondary | ICD-10-CM | POA: Insufficient documentation

## 2011-11-24 HISTORY — PX: PERCUTANEOUS CORONARY STENT INTERVENTION (PCI-S): SHX5485

## 2011-11-24 HISTORY — DX: Atherosclerotic heart disease of native coronary artery without angina pectoris: I25.10

## 2011-11-24 HISTORY — DX: Malignant (primary) neoplasm, unspecified: C80.1

## 2011-11-24 HISTORY — DX: Angina pectoris, unspecified: I20.9

## 2011-11-24 HISTORY — PX: LEFT HEART CATHETERIZATION WITH CORONARY ANGIOGRAM: SHX5451

## 2011-11-24 SURGERY — LEFT HEART CATHETERIZATION WITH CORONARY ANGIOGRAM
Anesthesia: LOCAL

## 2011-11-24 MED ORDER — LIDOCAINE HCL (PF) 1 % IJ SOLN
INTRAMUSCULAR | Status: AC
  Filled 2011-11-24: qty 30

## 2011-11-24 MED ORDER — SODIUM CHLORIDE 0.9 % IV SOLN: 250.0000 mL | INTRAVENOUS | Status: DC | PRN

## 2011-11-24 MED ORDER — ACETAMINOPHEN 325 MG PO TABS: 650.0000 mg | ORAL_TABLET | ORAL | Status: DC | PRN

## 2011-11-24 MED ORDER — FENTANYL CITRATE 0.05 MG/ML IJ SOLN
INTRAMUSCULAR | Status: AC
  Filled 2011-11-24: qty 2

## 2011-11-24 MED ORDER — ASPIRIN 81 MG PO CHEW: 81.0000 mg | CHEWABLE_TABLET | Freq: Every day | ORAL | Status: DC

## 2011-11-24 MED ORDER — SODIUM CHLORIDE 0.9 % IV SOLN: INTRAVENOUS | Status: DC

## 2011-11-24 MED ORDER — DIAZEPAM 5 MG PO TABS
5.0000 mg | ORAL_TABLET | ORAL | Status: AC
  Administered 2011-11-24: 5 mg via ORAL

## 2011-11-24 MED ORDER — PRASUGREL HCL 10 MG PO TABS
10.0000 mg | ORAL_TABLET | Freq: Every day | ORAL | Status: DC
  Administered 2011-11-25: 10 mg via ORAL
  Filled 2011-11-24 (×2): qty 1

## 2011-11-24 MED ORDER — ASPIRIN 81 MG PO CHEW
CHEWABLE_TABLET | ORAL | Status: AC
  Filled 2011-11-24: qty 4

## 2011-11-24 MED ORDER — BIVALIRUDIN 250 MG IV SOLR
INTRAVENOUS | Status: AC
  Filled 2011-11-24: qty 250

## 2011-11-24 MED ORDER — DIAZEPAM 5 MG PO TABS
ORAL_TABLET | ORAL | Status: AC
  Filled 2011-11-24: qty 1

## 2011-11-24 MED ORDER — ASPIRIN 81 MG PO CHEW: 324.0000 mg | CHEWABLE_TABLET | ORAL | Status: DC

## 2011-11-24 MED ORDER — ASPIRIN 81 MG PO CHEW
324.0000 mg | CHEWABLE_TABLET | ORAL | Status: AC
  Administered 2011-11-24: 324 mg via ORAL

## 2011-11-24 MED ORDER — NITROGLYCERIN IN D5W 200-5 MCG/ML-% IV SOLN
10.0000 ug/min | INTRAVENOUS | Status: DC
  Administered 2011-11-24: 10 ug/min via INTRAVENOUS

## 2011-11-24 MED ORDER — PRASUGREL HCL 10 MG PO TABS: 10.0000 mg | ORAL_TABLET | Freq: Every day | ORAL | Status: DC

## 2011-11-24 MED ORDER — NITROGLYCERIN IN D5W 200-5 MCG/ML-% IV SOLN
INTRAVENOUS | Status: AC
  Filled 2011-11-24: qty 250

## 2011-11-24 MED ORDER — OXYCODONE-ACETAMINOPHEN 5-325 MG PO TABS: 1.0000 | ORAL_TABLET | ORAL | Status: DC | PRN

## 2011-11-24 MED ORDER — SODIUM CHLORIDE 0.9 % IJ SOLN: 3.0000 mL | Freq: Two times a day (BID) | INTRAMUSCULAR | Status: DC

## 2011-11-24 MED ORDER — PANTOPRAZOLE SODIUM 40 MG PO TBEC
40.0000 mg | DELAYED_RELEASE_TABLET | Freq: Every day | ORAL | Status: DC
  Administered 2011-11-24: 15:00:00 40 mg via ORAL
  Filled 2011-11-24: qty 1

## 2011-11-24 MED ORDER — MIDAZOLAM HCL 2 MG/2ML IJ SOLN
INTRAMUSCULAR | Status: AC
  Filled 2011-11-24: qty 2

## 2011-11-24 MED ORDER — PRASUGREL HCL 10 MG PO TABS
ORAL_TABLET | ORAL | Status: AC
  Administered 2011-11-25: 10 mg via ORAL
  Filled 2011-11-24: qty 6

## 2011-11-24 MED ORDER — SODIUM CHLORIDE 0.9 % IV SOLN: INTRAVENOUS | Status: AC

## 2011-11-24 MED ORDER — SODIUM CHLORIDE 0.9 % IJ SOLN: 3.0000 mL | INTRAMUSCULAR | Status: DC | PRN

## 2011-11-24 MED ORDER — ONDANSETRON HCL 4 MG/2ML IJ SOLN: 4.0000 mg | Freq: Four times a day (QID) | INTRAMUSCULAR | Status: DC | PRN

## 2011-11-24 MED ORDER — ATORVASTATIN CALCIUM 20 MG PO TABS
20.0000 mg | ORAL_TABLET | Freq: Every day | ORAL | Status: DC
  Administered 2011-11-24: 17:00:00 20 mg via ORAL
  Filled 2011-11-24 (×2): qty 1

## 2011-11-24 MED ORDER — BISACODYL 5 MG PO TBEC
10.0000 mg | DELAYED_RELEASE_TABLET | Freq: Once | ORAL | Status: AC
  Administered 2011-11-24: 10 mg via ORAL
  Filled 2011-11-24: qty 2

## 2011-11-24 MED ORDER — NITROGLYCERIN 0.2 MG/ML ON CALL CATH LAB
INTRAVENOUS | Status: AC
  Filled 2011-11-24: qty 1

## 2011-11-24 MED ORDER — SODIUM CHLORIDE 0.9 % IV SOLN
INTRAVENOUS | Status: DC
  Administered 2011-11-24: 06:00:00 via INTRAVENOUS

## 2011-11-24 MED ORDER — ASPIRIN EC 81 MG PO TBEC
81.0000 mg | DELAYED_RELEASE_TABLET | Freq: Every day | ORAL | Status: DC
  Administered 2011-11-25: 81 mg via ORAL
  Filled 2011-11-24: qty 1

## 2011-11-25 LAB — CBC
HCT: 39.3 % (ref 39.0–52.0)
Hemoglobin: 13.2 g/dL (ref 13.0–17.0)
MCV: 93.3 fL (ref 78.0–100.0)
RBC: 4.21 MIL/uL — ABNORMAL LOW (ref 4.22–5.81)
WBC: 6.8 10*3/uL (ref 4.0–10.5)

## 2011-11-25 LAB — BASIC METABOLIC PANEL
BUN: 10 mg/dL (ref 6–23)
CO2: 24 mEq/L (ref 19–32)
Chloride: 109 mEq/L (ref 96–112)
Creatinine, Ser: 1.18 mg/dL (ref 0.50–1.35)
Glucose, Bld: 89 mg/dL (ref 70–99)
Potassium: 4 mEq/L (ref 3.5–5.1)

## 2011-11-25 MED ORDER — LISINOPRIL 10 MG PO TABS
10.0000 mg | ORAL_TABLET | Freq: Once | ORAL | Status: AC
  Administered 2011-11-25: 10:00:00 10 mg via ORAL
  Filled 2011-11-25: qty 1

## 2011-11-25 MED ORDER — METOPROLOL SUCCINATE ER 25 MG PO TB24: 25.0000 mg | ORAL_TABLET | Freq: Every day | ORAL | Status: DC

## 2011-11-25 MED ORDER — NITROGLYCERIN 0.4 MG SL SUBL: 0.4000 mg | SUBLINGUAL_TABLET | SUBLINGUAL | Status: DC | PRN

## 2011-11-25 MED ORDER — PANTOPRAZOLE SODIUM 40 MG PO TBEC: 40.0000 mg | DELAYED_RELEASE_TABLET | Freq: Every day | ORAL | Status: DC

## 2011-11-25 MED ORDER — METOPROLOL SUCCINATE ER 25 MG PO TB24
25.0000 mg | ORAL_TABLET | Freq: Once | ORAL | Status: AC
  Administered 2011-11-25: 25 mg via ORAL
  Filled 2011-11-25: qty 1

## 2011-11-25 MED FILL — Dextrose Inj 5%: INTRAVENOUS | Qty: 50 | Status: AC

## 2011-11-25 NOTE — Cardiovascular Report (Signed)
NAMEREINHART, SAULTERS NO.:  [id number]  MEDICAL RECORD NO.:  [id number]  LOCATION:  6527                         FACILITY:  [facility]  PHYSICIAN:  Jaylanni Eltringham N. Sharyn Lull, M.D. DATE OF BIRTH:  [birth year]  DATE OF PROCEDURE:  11/24/2011 DATE OF DISCHARGE:                           CARDIAC CATHETERIZATION   PROCEDURE: 1. Left cardiac catheterization with selective left and right coronary     angiography, left ventriculography via right groin using Judkins     technique. 2. Successful percutaneous transluminal coronary angioplasty to mid     left anterior descending artery using 2.5 x 8-mm long Trek balloon     for predilatation. 3. Successful deployment of 3.0 x 18-mm long Xience Xpedition drug-     eluting stent in mid left anterior descending artery. 4. Successful postdilatation of the stent using 3.25 x 12-mm long [place]     Trek balloon.  INDICATION FOR THE PROCEDURE:  [name] [name] is a 61 year old black male with past medical history significant for coronary artery disease, status post PTCA, stenting to proximal LAD in January of 2003.  He had 3.5 x 13-mm long Multilink stent, hypertension, hypercholesteremia, history of cancer of colon, history of hepatitis C, degenerative joint disease, history of nephrolithiasis, complains of retrosternal chest pain, described as tightness associated with mild shortness of breath, relieves with rest and sublingual nitroglycerin.  Denies any nausea, vomiting, or diaphoresis.  Denies palpitation, lightheadedness, or syncope.  Denies any rest angina.  The patient underwent Lexiscan Myoview on November 11, 2011, which showed moderate size of reversible ischemia in the anteroapical wall with EF of 54%.  Due to typical anginal chest pain, multiple risk factors and positive Lexiscan Myoview, discussed with the patient regarding left cath, possible PTCA and stenting, its risks and benefits, i.e., death, MI, stroke, need for emergency CABG,  local vascular complications, etc., and consented for PCI.  PROCEDURE:  After obtaining the informed consent, the patient was brought to the Cath Lab and was placed on fluoroscopy table.  Right groin was prepped and draped in usual fashion.  Xylocaine 1% was used for local anesthesia in the right groin.  With the help of thin wall needle, 5-French arterial sheath was placed.  The sheath was aspirated and flushed.  Next, 5-French left Judkins catheter was advanced over the wire under fluoroscopic guidance up to the ascending aorta.  Wire was pulled out, the catheter was aspirated and connected to the Manifold. Catheter was further advanced and engaged into left coronary ostium. Multiple views of the left system were taken.  Next, the catheter was disengaged and was pulled out over the wire and was replaced with 5- [place] right Judkins catheter, which was advanced over the wire under fluoroscopic guidance up to the ascending aorta.  Wire was pulled out, the catheter was aspirated and connected to the Manifold.  Catheter was further advanced and engaged into right coronary ostium.  Multiple views of the right system were taken.  Next, the catheter was disengaged and was pulled out over the wire and was replaced with 5-French pigtail catheter, which was advanced over the wire under fluoroscopic guidance up to the ascending aorta.  Wire was pulled out, the catheter  was aspirated and connected to the Manifold.  Catheter was further advanced across the aortic valve into the LV.  LV pressures were recorded.  Next, left ventriculography was done in 30-degree RAO position.  Post- angiographic pressures were recorded from LV and then pullback pressures were recorded from aorta.  There was no gradient across the aortic valve.  Next, the pigtail catheter was pulled out over the wire, sheaths were aspirated and flushed.  FINDINGS:  LV showed good LV systolic function, LVH, EF of 16-10%.  Left main  had 5-10% distal stenosis.  LAD has 50-60% ostial stenosis. Stented proximal portion has 20-30% in-stent diffuse restenosis and 70- 75% mid-stenosis.  Left circumflex was patent.  OM-1 was moderate size, which was patent.  OM-2 was moderate size, which was patent.  RCA has 15- 20% proximal and 20-30% mid-stenosis.  PDA is patent.  PLV branch has mild disease.  INTERVENTIONAL PROCEDURE:  Successful PTCA to mid-LAD was done using 2.5 x 8-mm long Trek balloon for predilatation and then 3.0 x 18-mm long Xience Xpedition drug-eluting stent was deployed at 11 atmospheric pressure in mid-LAD.  Stent was post dilated using 3.25 x 12-mm long [place] Trek balloon going up to 18 atmospheric pressure.  Lesion dilated from 70-75% to 0% residual with excellent TIMI grade 3 distal flow without evidence of dissection or distal embolization.  The patient received weight based Angiomax and 60 mg of prasugrel prior to the procedure. The patient tolerated the procedure well.  There were no complications. The patient was transferred to recovery room in stable condition.     Eduardo Osier. Sharyn Lull, M.D.     MNH/MEDQ  D:  11/24/2011  T:  11/25/2011  Job:  [id number]

## 2011-11-25 NOTE — Discharge Summary (Signed)
  Discharge summary dictated on 11/25/2011 dictation number is [phone]

## 2011-11-25 NOTE — Progress Notes (Signed)
CARDIAC REHAB PHASE I   PRE:  Rate/Rhythm: 55 SB  BP:  Supine: 162/91  Sitting:   Standing:    SaO2:   MODE:  Ambulation: 1000 ft   POST:  Rate/Rhythem: 76 SR  BP:  Supine:   Sitting: 162/135 recheck after 5 min. 157/109  Standing:     SaO2:  0750-0900 Pt tolerated ambulation well without c/o of cp or SOB. BP up after walk reported to MD. Completed discharge education with pt. He voices understanding. Pt agrees to Outpt. CRP in [place], will send referral.Discussed smoking cessation with pt. He states that he is done. Pt said he only smokes when he drinks beer.Gave pt smoking cessation tips and telephone numbers for coaching. Pt seems very motivated to quit.  [name] [name]

## 2011-11-25 NOTE — H&P (Signed)
  Handwritten H&P in the chart 

## 2011-11-26 NOTE — Discharge Summary (Signed)
[name] [name], [name] [name] NO.:  [id number]  MEDICAL RECORD NO.:  [id number]  LOCATION:  6527                         FACILITY:  [facility]  PHYSICIAN:  Charnel Giles N. Sharyn Lull, M.D. DATE OF BIRTH:  May 06, 1950  DATE OF ADMISSION:  11/24/2011 DATE OF DISCHARGE:  11/25/2011                              DISCHARGE SUMMARY   ADMITTING DIAGNOSES: 1. New onset angina, positive Lexiscan Myoview. 2. Coronary artery disease, history of percutaneous transluminal     coronary angioplasty stenting to proximal left anterior descending     in the past. 3. Hypertension. 4. Hypercholesteremia. 5. History of cancer of colon. 6. History of remote tobacco abuse. 7. Positive family history of coronary artery disease.  DISCHARGE DIAGNOSES: 1. New onset angina, status post left cath/percutaneous transluminal     coronary angioplasty stenting to mid left anterior descending. 2. Coronary artery disease, history of percutaneous transluminal     coronary angioplasty stenting to proximal left anterior descending,     which was patent. 3. Hypertension. 4. Hypercholesteremia. 5. History of cancer of colon. 6. Remote tobacco abuse. 7. Positive family history of coronary artery disease.  DISCHARGE HOME MEDICATIONS:  Metoprolol succinate 25 mg 1 tablet daily; Nitrostat 0.4 mg sublingual use as directed; Protonix 40 mg 1 tablet daily; aspirin 81 mg 1 tablet daily; atorvastatin 20 mg 1 tablet daily; lisinopril 10 mg 1 tablet daily, prasugrel 10 mg 1 tablet daily; trazodone 100 mg daily at night as needed.  DIET:  Low salt, low cholesterol.  ACTIVITY:  Increase activity slowly as tolerated.  Post PTCA stent instructions have been given.  The patient will be scheduled for phase II cardiac rehab as outpatient.  CONDITION AT DISCHARGE:  Stable.  BRIEF HISTORY AND HOSPITAL COURSE:  [name] [name] is 61 year old male with past medical history significant for coronary artery disease, status post PTCA  stenting to proximal LAD in January 2003, and 3.5 x 13 mm long Multilink stent, hypertension, hypercholesteremia, history of CA of colon, history of hepatitis C in the past, degenerative joint disease, history of nephrolithiasis, complaints of retrosternal chest pain, described as tightness associated with mild shortness of breath, release with rest and sublingual nitro.  The patient denies any nausea, vomiting, diaphoresis.  Denies any palpitation, lightheadedness or syncope.  Patient underwent Lexiscan Myoview on November 11, 2011, which showed moderate size of reversible ischemia in the anteroapical wall with EF of 54%.  The patient denies any relation of chest pain to food, breathing, or movement.  PAST MEDICAL HISTORY:  As above.  PAST SURGICAL HISTORY:  He had partial colectomy in the past, had knee surgery in past, left circumcision in the past, had PTCA stenting to LAD in the past.  ALLERGIES:  SULFA.  MEDICATIONS AT HOME:  He was on aspirin, Effient, Lipitor, lisinopril, Imdur, and Nitrostat.  SOCIAL HISTORY:  He is married has 1 child.  Smoked half pack per day for 10 plus years.  He used to drink socially.  FAMILY HISTORY:  Positive for coronary artery disease.  PHYSICAL EXAMINATION:  GENERAL:  He was alert, awake, and oriented x3, in no acute distress. VITAL SIGNS:  Blood pressure was 140/80, pulse was 62 and regular. HEENT:  Conjunctivae was pink. NECK:  Supple.  No JVD.  No bruit. LUNGS:  Clear to auscultation without rhonchi or rales. CARDIOVASCULAR:  S1, S2 was normal.  There was soft systolic murmur.  No S3 gallop. ABDOMEN:  Soft.  Bowel sounds were present.  Nontender. EXTREMITIES:  There was no clubbing, cyanosis, or edema.  LABORATORY DATA:  Postprocedure lab, sodium 141, potassium 4.0, BUN 10, creatinine 1.18.  Hemoglobin 13.2, hematocrit 39.3, white count 6.8, platelet count 217,000.  EKG showed normal sinus bradycardia. Postprocedure, the patient's EKG  showed sinus bradycardia with early repolarization changes.  No acute ischemic changes.  BRIEF HOSPITAL COURSE:  The patient was a.m. admit and underwent left cardiac cath with selective left and right coronary angiography and PTCA stenting to mid LAD.  As per procedure report, the patient tolerated procedure well.  There were no complications.  Postprocedure, the patient did not had any episodes of chest pain.  During the hospital stay, his groin is stable with no evidence of hematoma or bruit.  The patient has been ambulating in hallway with cardiac rehab.  His post PCI instructions have been given including lifestyle modification.  The patient will be discharged home on above medications and will be followed up in my office in 1 week.     Eduardo Osier. Sharyn Lull, M.D.     MNH/MEDQ  D:  11/25/2011  T:  11/26/2011  Job:  [id number]

## 2011-12-29 ENCOUNTER — Encounter (HOSPITAL_COMMUNITY)
Admission: RE | Admit: 2011-12-29 | Discharge: 2011-12-29 | Disposition: A | Discharge status: Home / Self Care | Payer: Medicaid Other | Source: Ambulatory Visit | Attending: Cardiology | Admitting: Cardiology

## 2011-12-29 DIAGNOSIS — I2510 Atherosclerotic heart disease of native coronary artery without angina pectoris: Secondary | ICD-10-CM | POA: Insufficient documentation

## 2011-12-29 DIAGNOSIS — I10 Essential (primary) hypertension: Secondary | ICD-10-CM | POA: Insufficient documentation

## 2011-12-29 DIAGNOSIS — E7800 Pure hypercholesterolemia, unspecified: Secondary | ICD-10-CM | POA: Insufficient documentation

## 2011-12-29 DIAGNOSIS — Z5189 Encounter for other specified aftercare: Secondary | ICD-10-CM | POA: Insufficient documentation

## 2011-12-29 DIAGNOSIS — Z85038 Personal history of other malignant neoplasm of large intestine: Secondary | ICD-10-CM | POA: Insufficient documentation

## 2011-12-29 DIAGNOSIS — Z9861 Coronary angioplasty status: Secondary | ICD-10-CM | POA: Insufficient documentation

## 2011-12-29 NOTE — Progress Notes (Signed)
Cardiac Rehab Medication Review by a Pharmacist  Does the patient  feel that his/her medications are working for him/her?  yes  Has the patient been experiencing any side effects to the medications prescribed?  no  Does the patient measure his/her own blood pressure or blood glucose at home?  no   Does the patient have any problems obtaining medications due to transportation or finances?   no  Understanding of regimen: good Understanding of indications: good Potential of compliance: good    Pharmacist comments: Good understanding, would benefit from home BP monitoring  Bernadene Person PharmD Candidate  I have reviewed the student's note and agree with the findings.  Chrystina Naff D. Shontia Gillooly, PharmD Clinical Pharmacist Pager: [phone] Phone: [phone] 12/29/2011 9:23 AM

## 2012-01-02 ENCOUNTER — Encounter (HOSPITAL_COMMUNITY)
Admission: RE | Admit: 2012-01-02 | Discharge: 2012-01-02 | Disposition: A | Discharge status: Home / Self Care | Payer: Medicaid Other | Source: Ambulatory Visit | Attending: Cardiology | Admitting: Cardiology

## 2012-01-02 NOTE — Progress Notes (Signed)
Pt started cardiac rehab today.  Pt tolerated light exercise without difficulty. Telemetry Sinus without ectopy. Vital signs stable. Will continue to monitor the patient throughout  the program.

## 2012-01-04 ENCOUNTER — Encounter (HOSPITAL_COMMUNITY): Payer: Medicaid Other

## 2012-01-06 ENCOUNTER — Encounter (HOSPITAL_COMMUNITY)
Admission: RE | Admit: 2012-01-06 | Discharge: 2012-01-06 | Disposition: A | Discharge status: Home / Self Care | Payer: Medicaid Other | Source: Ambulatory Visit | Attending: Cardiology | Admitting: Cardiology

## 2012-01-09 ENCOUNTER — Encounter (HOSPITAL_COMMUNITY)
Admission: RE | Admit: 2012-01-09 | Discharge: 2012-01-09 | Disposition: A | Discharge status: Home / Self Care | Payer: Medicaid Other | Source: Ambulatory Visit | Attending: Cardiology | Admitting: Cardiology

## 2012-01-09 NOTE — Progress Notes (Signed)
Reviewed home exercise with pt today.  Pt plans to walk and go to [employer] at work for exercise.  Reviewed THR, pulse (needs practice, class on Friday), RPE, sign and symptoms, NTG use, and when to call 911 or MD.  Pt voiced understanding. Fabio Pierce, MA, ACSM RCEP

## 2012-01-11 ENCOUNTER — Encounter (HOSPITAL_COMMUNITY)
Admission: RE | Admit: 2012-01-11 | Discharge: 2012-01-11 | Payer: Medicaid Other | Source: Ambulatory Visit | Attending: Cardiology | Admitting: Cardiology

## 2012-01-11 NOTE — Progress Notes (Signed)
Jamesrobert did not exercise this AM.  Jaekwon said he took a sublingual nitroglycerin on Monday at school  With relief.  Atul said he had some chest tightness yesterday at lunch but denied having actual chest pain. Dr Annitta Jersey office called and notified. Abdoulaye has an appointment to see Dr Sharyn Lull today at 1:45.

## 2012-01-13 ENCOUNTER — Encounter (HOSPITAL_COMMUNITY): Payer: Medicaid Other

## 2012-01-16 ENCOUNTER — Encounter (HOSPITAL_COMMUNITY)
Admission: RE | Admit: 2012-01-16 | Discharge: 2012-01-16 | Disposition: A | Discharge status: Home / Self Care | Payer: Medicaid Other | Source: Ambulatory Visit | Attending: Cardiology | Admitting: Cardiology

## 2012-01-16 DIAGNOSIS — Z85038 Personal history of other malignant neoplasm of large intestine: Secondary | ICD-10-CM | POA: Insufficient documentation

## 2012-01-16 DIAGNOSIS — Z5189 Encounter for other specified aftercare: Secondary | ICD-10-CM | POA: Insufficient documentation

## 2012-01-16 DIAGNOSIS — Z9861 Coronary angioplasty status: Secondary | ICD-10-CM | POA: Insufficient documentation

## 2012-01-16 DIAGNOSIS — I10 Essential (primary) hypertension: Secondary | ICD-10-CM | POA: Insufficient documentation

## 2012-01-16 DIAGNOSIS — I2510 Atherosclerotic heart disease of native coronary artery without angina pectoris: Secondary | ICD-10-CM | POA: Insufficient documentation

## 2012-01-16 DIAGNOSIS — E7800 Pure hypercholesterolemia, unspecified: Secondary | ICD-10-CM | POA: Insufficient documentation

## 2012-01-17 NOTE — Progress Notes (Signed)
[name] [name] returned to cardiac rehab yesterday per Dr Sharyn Lull and exercised without complaints. Reviewed Eileen's quality of life questionnaire with the patient.  Krishav scored low in the health and functioning aspect of the questionnaire but denies being depressed.  Dr Sharyn Lull notified of low quality of life score via fax.

## 2012-01-18 ENCOUNTER — Encounter (HOSPITAL_COMMUNITY): Payer: Medicaid Other

## 2012-01-20 ENCOUNTER — Encounter (HOSPITAL_COMMUNITY): Payer: Medicaid Other

## 2012-01-23 ENCOUNTER — Encounter (HOSPITAL_COMMUNITY): Payer: Medicaid Other

## 2012-01-25 ENCOUNTER — Encounter (HOSPITAL_COMMUNITY): Payer: Medicaid Other

## 2012-01-27 ENCOUNTER — Encounter (HOSPITAL_COMMUNITY)
Admission: RE | Admit: 2012-01-27 | Discharge: 2012-01-27 | Disposition: A | Discharge status: Home / Self Care | Payer: Medicaid Other | Source: Ambulatory Visit | Attending: Cardiology | Admitting: Cardiology

## 2012-01-27 NOTE — Progress Notes (Signed)
Gerado returned to exercise today after being out with a cold.  Post exercise heart rate noted at 52.  Patient asymptomatic. Will fax exercise flow sheets to Dr. Annitta Jersey office for review with today's ECG tracing.

## 2012-01-30 ENCOUNTER — Encounter (HOSPITAL_COMMUNITY): Payer: Medicaid Other

## 2012-02-01 ENCOUNTER — Encounter (HOSPITAL_COMMUNITY): Payer: Medicaid Other

## 2012-02-03 ENCOUNTER — Encounter (HOSPITAL_COMMUNITY): Payer: Medicaid Other

## 2012-02-06 ENCOUNTER — Encounter (HOSPITAL_COMMUNITY): Payer: Medicaid Other

## 2012-02-08 ENCOUNTER — Encounter (HOSPITAL_COMMUNITY): Payer: Medicaid Other

## 2012-02-13 ENCOUNTER — Encounter (HOSPITAL_COMMUNITY): Payer: Medicaid Other

## 2012-02-15 ENCOUNTER — Encounter (HOSPITAL_COMMUNITY): Payer: Medicaid Other

## 2012-02-17 ENCOUNTER — Encounter (HOSPITAL_COMMUNITY): Payer: Medicaid Other

## 2012-02-20 ENCOUNTER — Encounter (HOSPITAL_COMMUNITY): Payer: Medicaid Other

## 2012-02-22 ENCOUNTER — Encounter (HOSPITAL_COMMUNITY): Payer: Medicaid Other

## 2012-02-24 ENCOUNTER — Encounter (HOSPITAL_COMMUNITY): Payer: Medicaid Other

## 2012-02-27 ENCOUNTER — Encounter (HOSPITAL_COMMUNITY): Payer: Medicaid Other

## 2012-02-29 ENCOUNTER — Encounter (HOSPITAL_COMMUNITY): Payer: Medicaid Other

## 2012-03-02 ENCOUNTER — Encounter (HOSPITAL_COMMUNITY): Payer: Medicaid Other

## 2012-03-05 ENCOUNTER — Encounter (HOSPITAL_COMMUNITY): Payer: Medicaid Other

## 2012-03-09 ENCOUNTER — Encounter (HOSPITAL_COMMUNITY): Payer: Medicaid Other

## 2012-03-12 ENCOUNTER — Encounter (HOSPITAL_COMMUNITY): Payer: Medicaid Other

## 2012-03-16 ENCOUNTER — Encounter (HOSPITAL_COMMUNITY): Payer: Medicaid Other

## 2012-03-19 ENCOUNTER — Encounter (HOSPITAL_COMMUNITY): Payer: Medicaid Other

## 2012-03-21 ENCOUNTER — Encounter (HOSPITAL_COMMUNITY): Payer: Medicaid Other

## 2012-03-23 ENCOUNTER — Encounter (HOSPITAL_COMMUNITY): Payer: Medicaid Other

## 2012-03-26 ENCOUNTER — Encounter (HOSPITAL_COMMUNITY): Payer: Medicaid Other

## 2012-03-28 ENCOUNTER — Encounter (HOSPITAL_COMMUNITY): Payer: Medicaid Other

## 2012-03-30 ENCOUNTER — Encounter (HOSPITAL_COMMUNITY): Payer: Medicaid Other

## 2012-04-02 ENCOUNTER — Encounter (HOSPITAL_COMMUNITY): Payer: Medicaid Other

## 2012-04-02 IMAGING — CR DG CHEST 2V
2 series · 2 of 2 positions shown · non-contrast
Comparison: 09/23/2008

CLINICAL DATA: Known tobacco use, hypertension

CHEST - 2 VIEW

[view not recorded (1 of 2)]
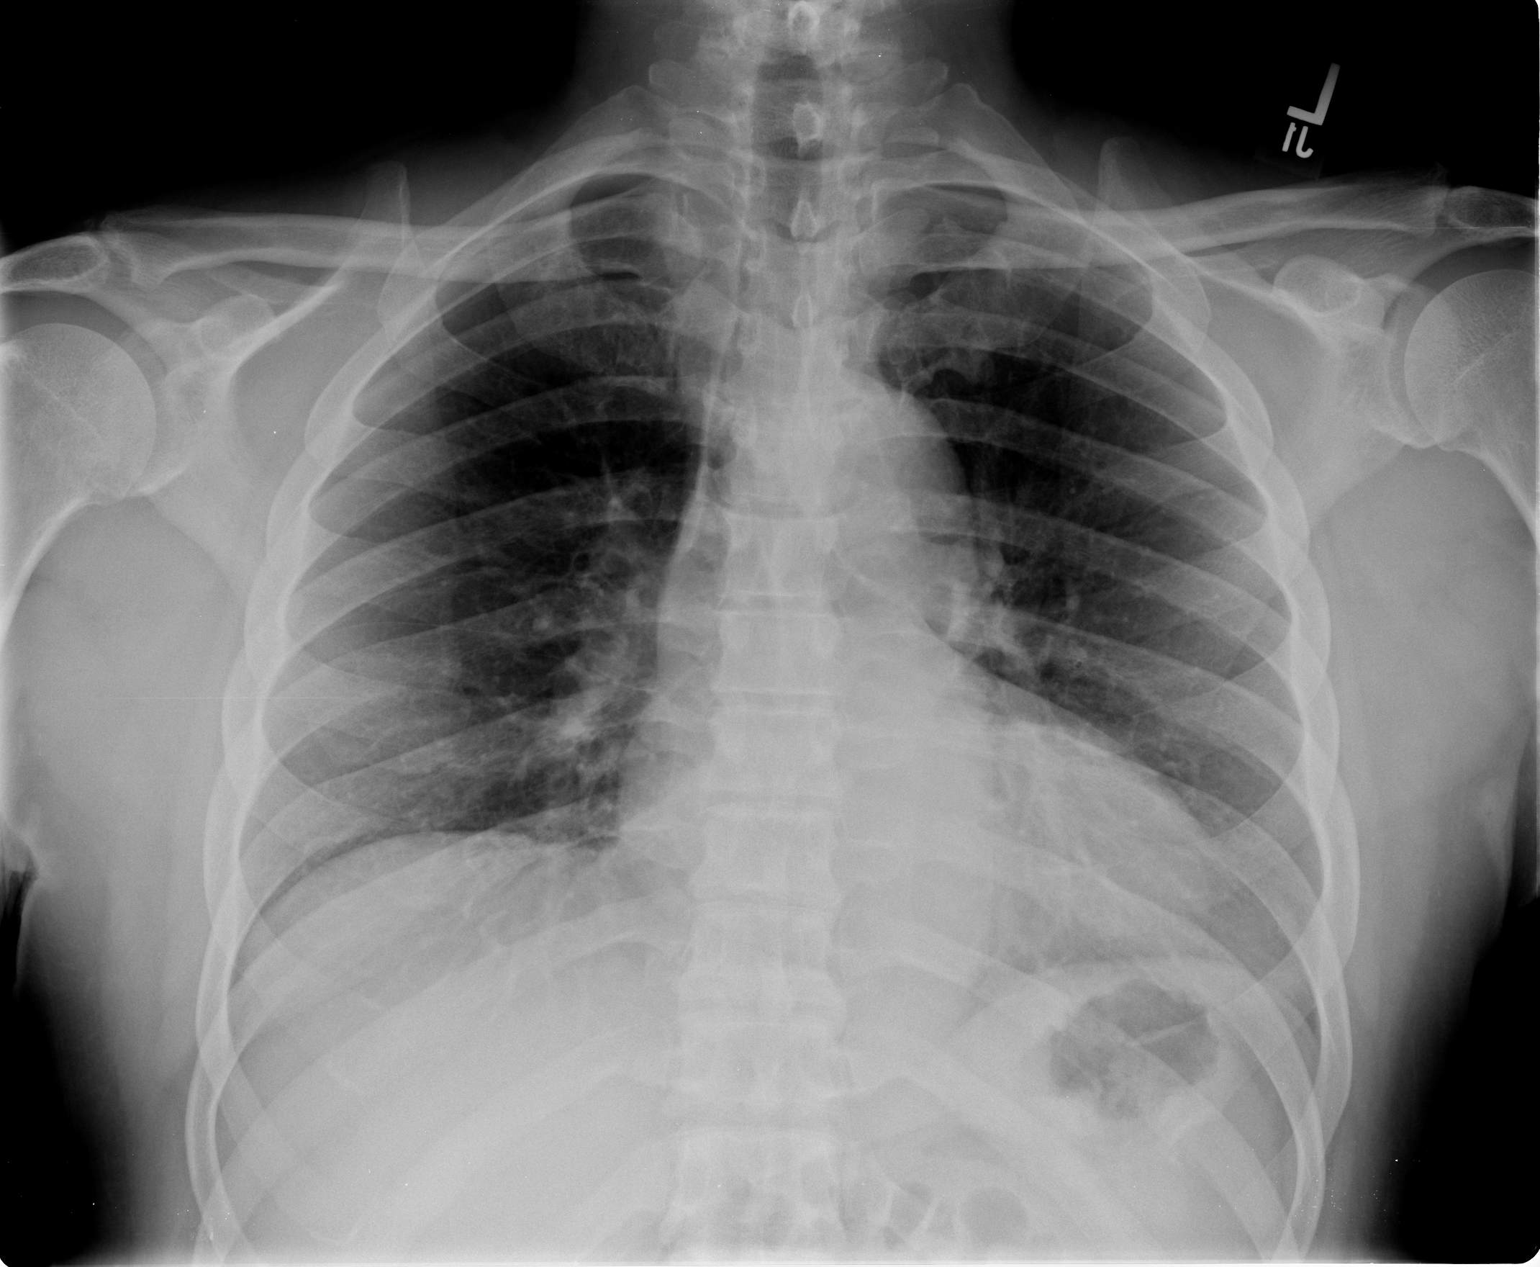

[view not recorded (2 of 2)]
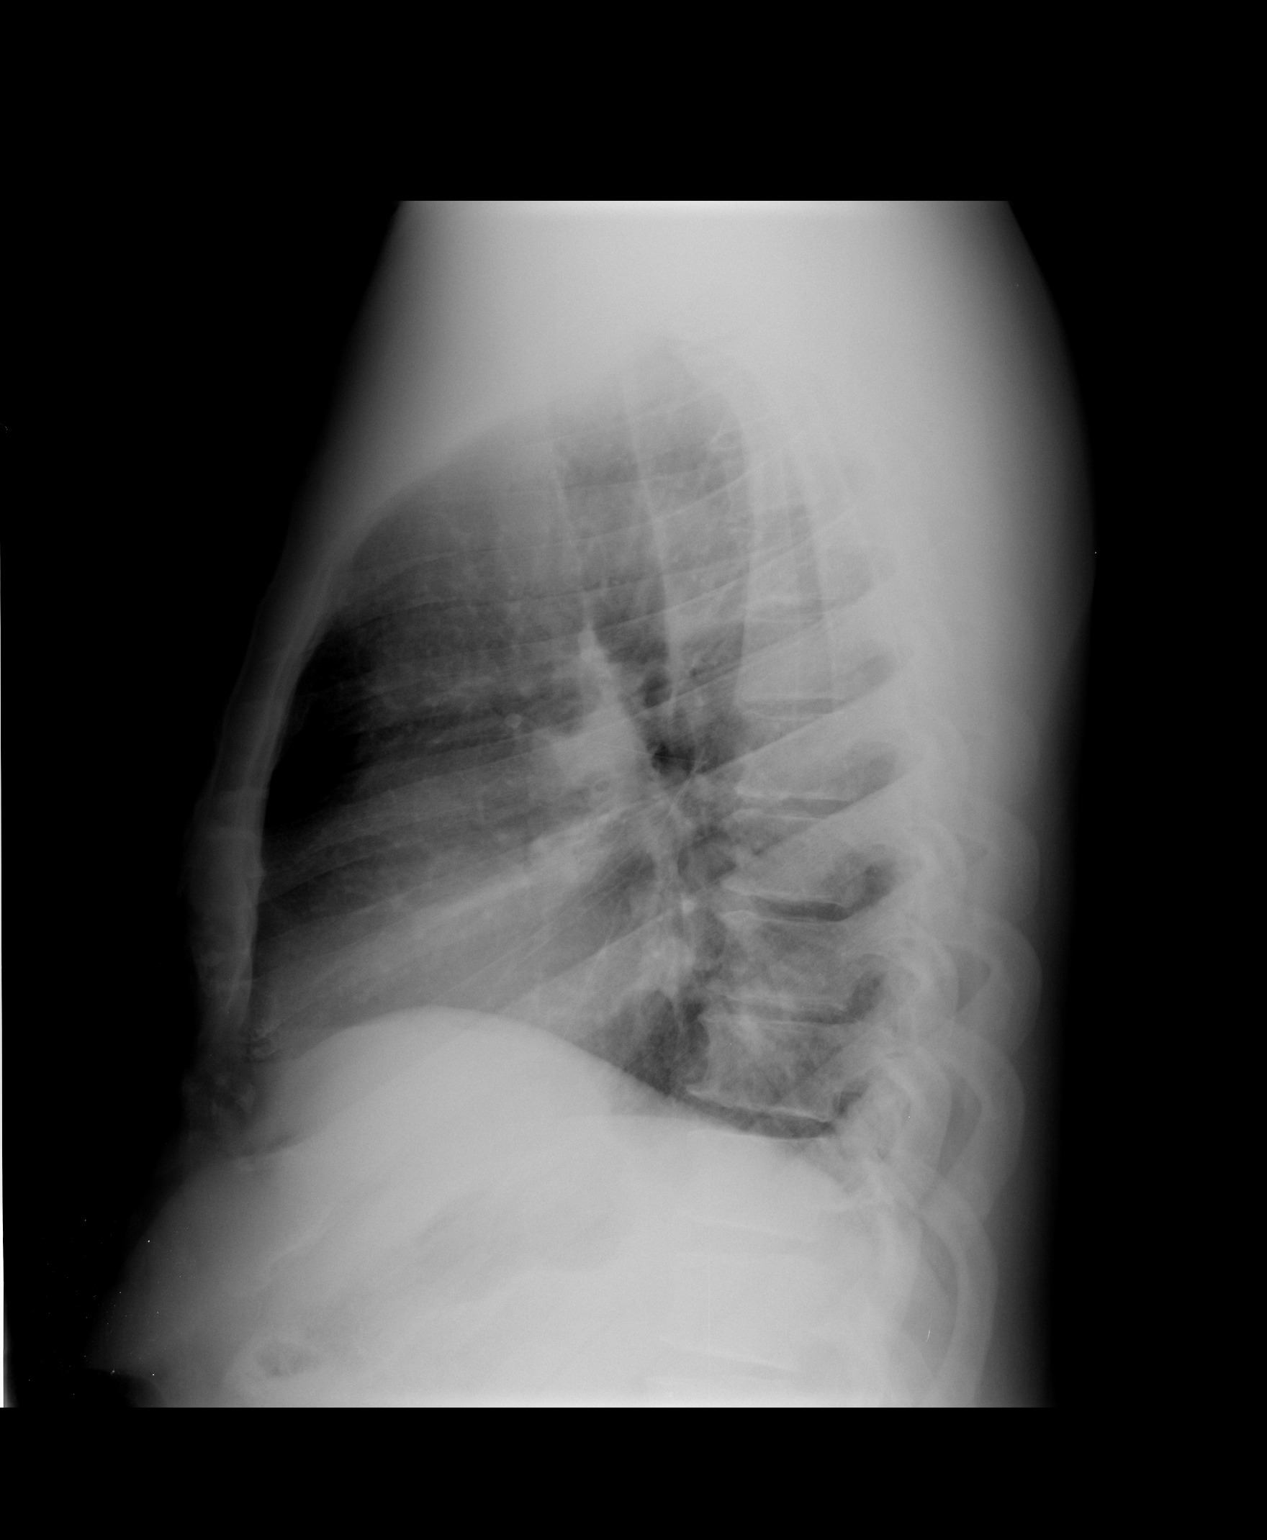

[2 of 2 positions shown; findings below may reference images not displayed]

FINDINGS: The heart and pulmonary vascularity are within normal
limits.  The thoracic aorta is mildly tortuous.  Mild degenerate
changes of the thoracic spine are seen.  The lungs are clear
without focal infiltrate.
IMPRESSION: No acute abnormality is noted.

## 2012-04-04 ENCOUNTER — Encounter (HOSPITAL_COMMUNITY): Payer: Medicaid Other

## 2012-04-06 ENCOUNTER — Encounter (HOSPITAL_COMMUNITY): Payer: Medicaid Other

## 2012-04-10 IMAGING — CR DG ABD PORTABLE 1V
1 series · 1 of 1 positions shown · non-contrast
Comparison: CT 10/16/2009

CLINICAL DATA: Abdominal pain and distension.

ABDOMEN - 1 VIEW

[AP]
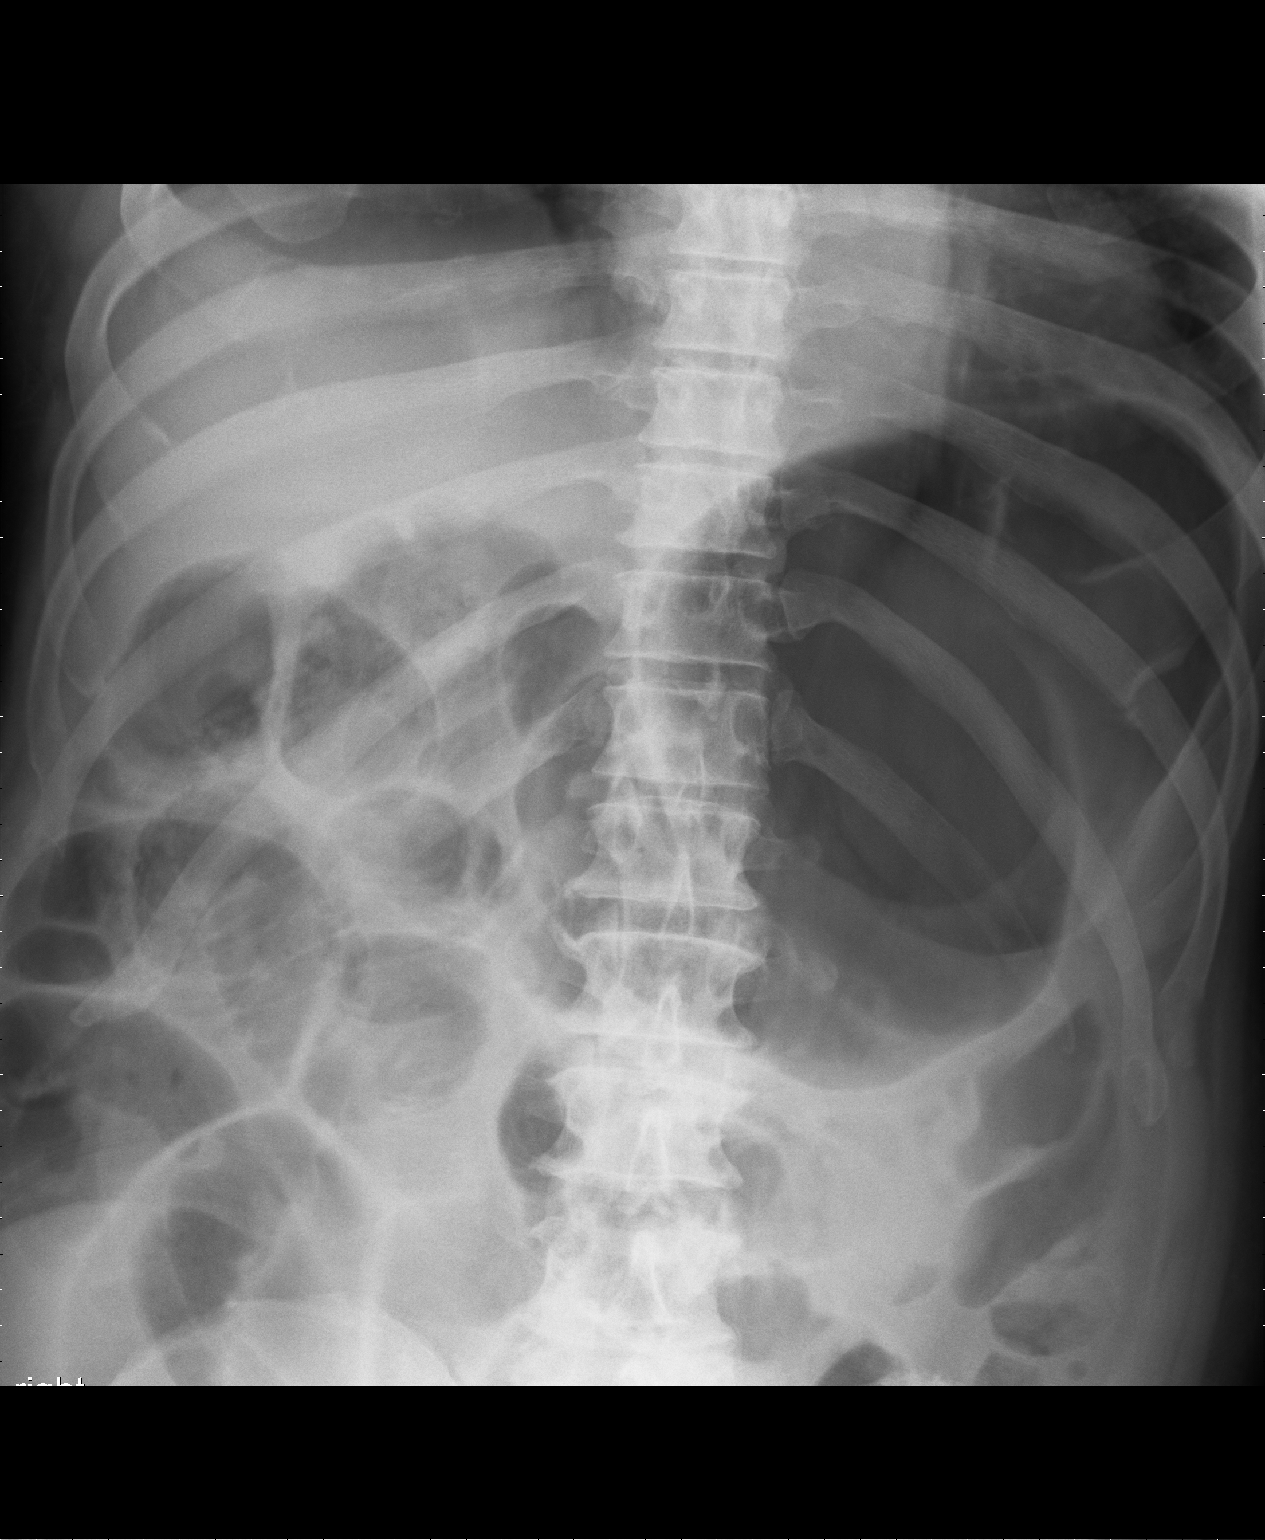

[1 of 1 positions shown; findings below may reference images not displayed]

FINDINGS: Gaseous distension of stomach and small bowel loops.  Gas
filled nondistended colon with some stool.  Changes suggest small
bowel obstruction.  Degenerative changes in the lumbar spine.
IMPRESSION: Gaseous distension of the stomach and small bowel with decompressed
colon suggesting small bowel obstruction.

## 2012-04-11 IMAGING — CR DG ABDOMEN ACUTE W/ 1V CHEST
3 series · 3 of 3 positions shown · non-contrast
Comparison: One-view abdomen x-ray yesterday.  Two-view chest x-ray
01/05/2011.

CLINICAL DATA: Abdominal distention.  Leukocytosis.  Postop knee
arthroplasty.  Follow up ileus versus obstruction.

ACUTE ABDOMEN SERIES (ABDOMEN 2 VIEW & CHEST 1 VIEW) 01/14/2011:

[w chest pa]
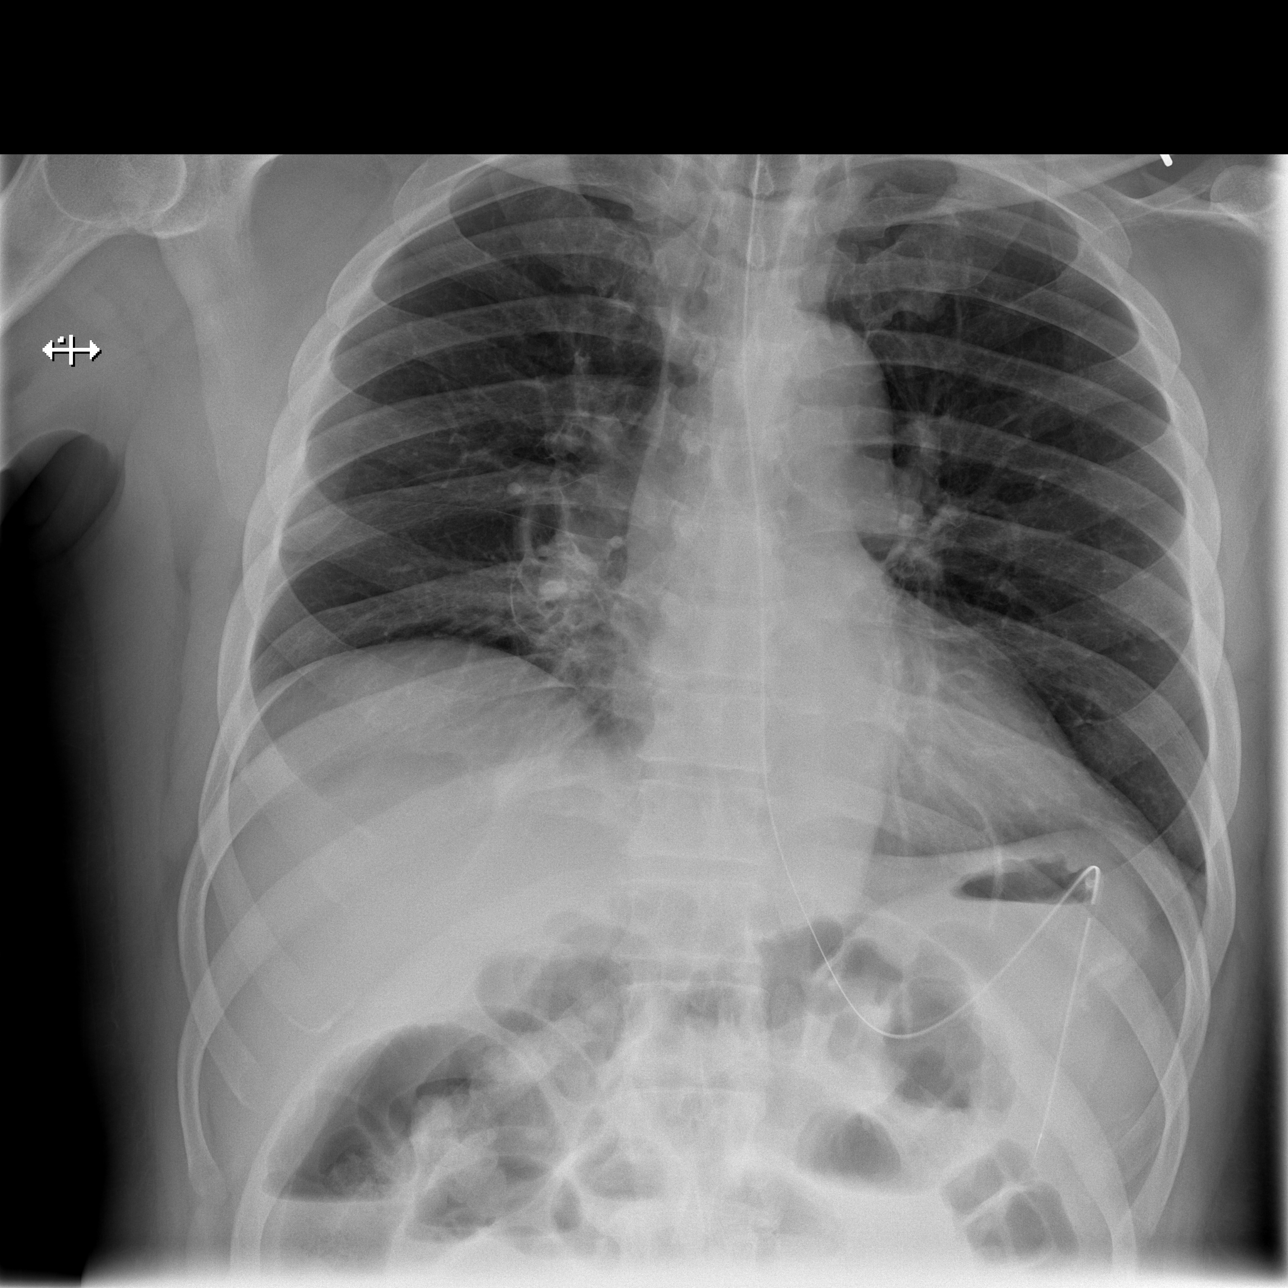

[w abdomen upright]
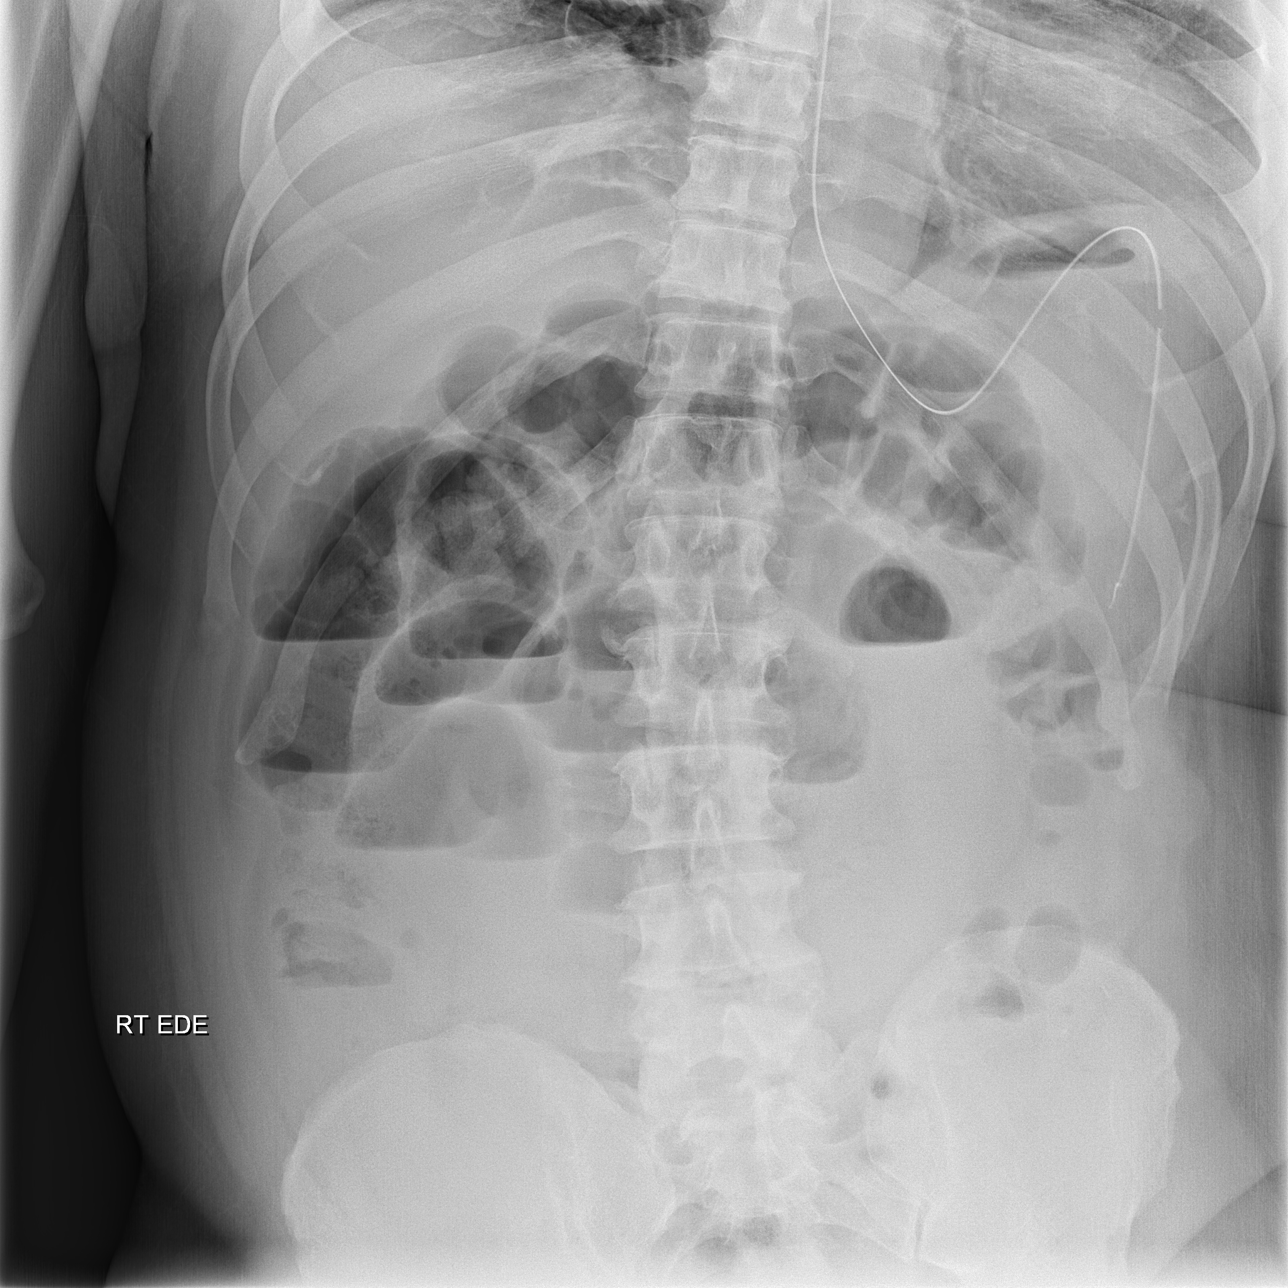

[t abdomen supine]
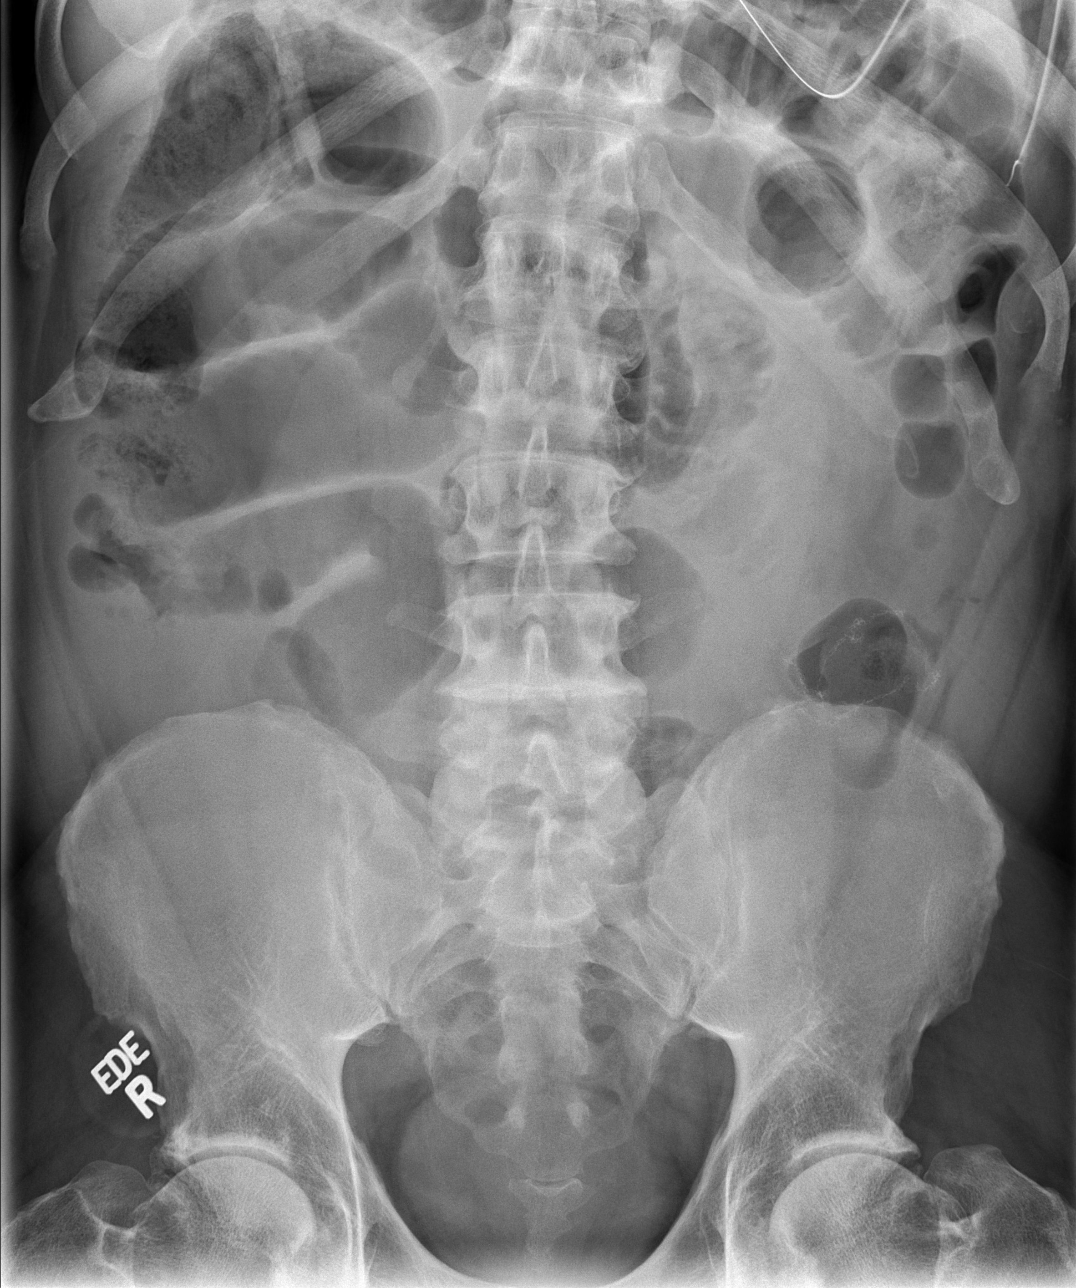

[3 of 3 positions shown; findings below may reference images not displayed]

FINDINGS: Persistent multiple dilated loops of small bowel
throughout the abdomen, demonstrating air fluid levels on the erect
image.  No evidence of free intraperitoneal air.  Colon
decompressed.  Surgical anastomotic suture material in the left mid
abdomen.  Nasogastric tube tip in the proximal body the stomach.
No visible opaque urinary tract calculi.  Degenerative changes
involving the lumbar spine.

Cardiac silhouette upper normal in size to slightly enlarged but
stable.  Lungs clear.  Slight elevation of the right hemidiaphragm,
unchanged.  No pleural effusions.
IMPRESSION: 1.  Stable partial small bowel obstruction.  No free
intraperitoneal air.
2.  Stable borderline to mild cardiomegaly.  No acute
cardiopulmonary disease.

## 2012-10-17 ENCOUNTER — Other Ambulatory Visit: Payer: Medicaid Other | Admitting: Lab

## 2012-10-18 ENCOUNTER — Other Ambulatory Visit: Payer: Medicaid Other | Admitting: Lab

## 2012-10-18 ENCOUNTER — Ambulatory Visit: Payer: Medicaid Other

## 2012-11-30 ENCOUNTER — Other Ambulatory Visit (HOSPITAL_COMMUNITY): Payer: Self-pay | Admitting: Cardiology

## 2012-11-30 DIAGNOSIS — R079 Chest pain, unspecified: Secondary | ICD-10-CM

## 2012-12-10 ENCOUNTER — Encounter (HOSPITAL_COMMUNITY): Payer: Medicaid Other

## 2012-12-10 ENCOUNTER — Encounter (HOSPITAL_COMMUNITY)
Admission: RE | Admit: 2012-12-10 | Discharge: 2012-12-10 | Disposition: A | Discharge status: Home / Self Care | Payer: Medicaid Other | Source: Ambulatory Visit | Attending: Cardiology | Admitting: Cardiology

## 2012-12-10 ENCOUNTER — Encounter (HOSPITAL_COMMUNITY): Admission: RE | Admit: 2012-12-10 | Payer: Medicaid Other | Source: Ambulatory Visit

## 2012-12-10 DIAGNOSIS — R079 Chest pain, unspecified: Secondary | ICD-10-CM | POA: Insufficient documentation

## 2012-12-14 ENCOUNTER — Encounter (HOSPITAL_COMMUNITY)
Admission: RE | Admit: 2012-12-14 | Discharge: 2012-12-14 | Disposition: A | Discharge status: Home / Self Care | Payer: Medicaid Other | Source: Ambulatory Visit | Attending: Cardiology | Admitting: Cardiology

## 2012-12-14 ENCOUNTER — Other Ambulatory Visit: Payer: Self-pay

## 2012-12-14 DIAGNOSIS — R079 Chest pain, unspecified: Secondary | ICD-10-CM

## 2012-12-14 MED ORDER — REGADENOSON 0.4 MG/5ML IV SOLN
0.4000 mg | Freq: Once | INTRAVENOUS | Status: AC
  Administered 2012-12-14: 0.4 mg via INTRAVENOUS

## 2012-12-14 MED ORDER — TECHNETIUM TC 99M SESTAMIBI GENERIC - CARDIOLITE
30.0000 | Freq: Once | INTRAVENOUS | Status: AC | PRN
  Administered 2012-12-14: 30 via INTRAVENOUS

## 2012-12-14 MED ORDER — TECHNETIUM TC 99M SESTAMIBI GENERIC - CARDIOLITE
10.0000 | Freq: Once | INTRAVENOUS | Status: AC | PRN
  Administered 2012-12-14: 10 via INTRAVENOUS

## 2012-12-18 ENCOUNTER — Other Ambulatory Visit: Payer: Self-pay | Admitting: Internal Medicine

## 2012-12-18 DIAGNOSIS — C189 Malignant neoplasm of colon, unspecified: Secondary | ICD-10-CM

## 2012-12-18 DIAGNOSIS — B1710 Acute hepatitis C without hepatic coma: Secondary | ICD-10-CM

## 2012-12-19 ENCOUNTER — Telehealth: Payer: Self-pay | Admitting: Internal Medicine

## 2012-12-19 ENCOUNTER — Other Ambulatory Visit (HOSPITAL_BASED_OUTPATIENT_CLINIC_OR_DEPARTMENT_OTHER): Payer: Medicaid Other | Admitting: Lab

## 2012-12-19 ENCOUNTER — Ambulatory Visit (HOSPITAL_BASED_OUTPATIENT_CLINIC_OR_DEPARTMENT_OTHER): Payer: Medicaid Other | Admitting: Internal Medicine

## 2012-12-19 VITALS — BP 152/91 | HR 70 | Temp 98.0°F | Resp 18 | Ht 70.0 in | Wt 220.1 lb

## 2012-12-19 DIAGNOSIS — B1710 Acute hepatitis C without hepatic coma: Secondary | ICD-10-CM

## 2012-12-19 DIAGNOSIS — Z85038 Personal history of other malignant neoplasm of large intestine: Secondary | ICD-10-CM

## 2012-12-19 DIAGNOSIS — B1920 Unspecified viral hepatitis C without hepatic coma: Secondary | ICD-10-CM

## 2012-12-19 DIAGNOSIS — C187 Malignant neoplasm of sigmoid colon: Secondary | ICD-10-CM

## 2012-12-19 DIAGNOSIS — C189 Malignant neoplasm of colon, unspecified: Secondary | ICD-10-CM

## 2012-12-19 LAB — CBC WITH DIFFERENTIAL/PLATELET
BASO%: 1.2 % (ref 0.0–2.0)
EOS%: 2.9 % (ref 0.0–7.0)
MCH: 32.1 pg (ref 27.2–33.4)
MCHC: 33.6 g/dL (ref 32.0–36.0)
MONO#: 0.4 10*3/uL (ref 0.1–0.9)
RDW: 13.7 % (ref 11.0–14.6)
WBC: 5.4 10*3/uL (ref 4.0–10.3)
lymph#: 1.7 10*3/uL (ref 0.9–3.3)
nRBC: 0 % (ref 0–0)

## 2012-12-19 LAB — COMPREHENSIVE METABOLIC PANEL (CC13)
ALT: 59 U/L — ABNORMAL HIGH (ref 0–55)
AST: 69 U/L — ABNORMAL HIGH (ref 5–34)
Albumin: 3.3 g/dL — ABNORMAL LOW (ref 3.5–5.0)
Alkaline Phosphatase: 83 U/L (ref 40–150)
Chloride: 108 mEq/L (ref 98–109)
Potassium: 4 mEq/L (ref 3.5–5.1)
Sodium: 141 mEq/L (ref 136–145)
Total Protein: 7.3 g/dL (ref 6.4–8.3)

## 2012-12-19 NOTE — Patient Instructions (Signed)
Hepatitis C °Hepatitis C is a viral infection of the liver. Infection may go undetected for months or years because symptoms may be absent or very mild. Chronic liver disease is the main danger of hepatitis C. This may lead to scarring of the liver (cirrhosis), liver failure, and liver cancer. °CAUSES  °Hepatitis C is caused by the hepatitis C virus (HCV). Formerly, hepatitis C infections were most commonly transmitted through blood transfusions. In the early 1990s, routine testing of donated blood for hepatitis C and exclusion of blood that tests positive for HCV began. Now, HCV is most commonly transmitted from person to person through injection drug use, sharing needles, or sex with an infected person. A caregiver may also get the infection from exposure to the blood of an infected patient by way of a cut or needle stick.  °SYMPTOMS  °Acute Phase °Many cases of acute HCV infection are mild and cause few problems. Some people may not even realize they are sick. Symptoms in others may last a few weeks to several months and include: °· Feeling very tired. °· Loss of appetite. °· Nausea. °· Vomiting. °· Abdominal pain. °· Dark yellow urine. °· Yellow skin and eyes (jaundice). °· Itching of the skin. °Chronic Phase °· Between 50% to 85% of people who get HCV infection become "chronic carriers." They often have no symptoms, but the virus stays in their body. They may spread the virus to others and can get long-term liver disease. °· Many people with chronic HCV infection remain healthy for many years. However, up to 1 in 5 chronically infected people may develop severe liver diseases including scarring of the liver (cirrhosis), liver failure, or liver cancer. °DIAGNOSIS  °Diagnosis of hepatitis C infection is made by testing blood for the presence of hepatitis C viral particles called RNA. Other tests may also be done to measure the status of current liver function, exclude other liver problems, or assess liver  damage. °TREATMENT  °Treatment with many antiviral drugs is available and recommended for some patients with chronic HCV infection. Drug treatment is generally considered appropriate for patients who: °· Are 18 years of age or older. °· Have a positive test for HCV particles in the blood. °· Have a liver tissue sample (biopsy) that shows chronic hepatitis and significant scarring (fibrosis). °· Do not have signs of liver failure. °· Have acceptable blood test results that confirm the wellness of other body organs. °· Are willing to be treated and conform to treatment requirements. °· Have no other circumstances that would prevent treatment from being recommended (contraindications). °All people who are offered and choose to receive drug treatment must understand that careful medical follow up for many months and even years is crucial in order to make successful care possible. The goal of drug treatment is to eliminate any evidence of HCV in the blood on a long-term basis. This is called a "sustained virologic response" or SVR. Achieving a SVR is associated with a decrease in the chance of life-threatening liver problems, need for a liver transplant, liver cancer rates, and liver-related complications. °Successful treatment currently requires taking treatment drugs for at least 24 weeks and up to 72 weeks. An injected drug (interferon) given weekly and an oral antiviral medicine taken daily are usually prescribed. Side effects from these drugs are common and some may be very serious. Your response to treatment must be carefully monitored by both you and your caregiver throughout the entire treatment period. °PREVENTION °There is no vaccine for hepatitis C. The only   way to prevent the disease is to reduce the risk of exposure to the virus.  °· Avoid sharing drug needles or personal items like toothbrushes, razors, and nail clippers with an infected person. °· Healthcare workers need to avoid injuries and wear  appropriate protective equipment such as gloves, gowns, and face masks when performing invasive medical or nursing procedures. °HOME CARE INSTRUCTIONS  °To avoid making your liver disease worse: °· Strictly avoid drinking alcohol. °· Carefully review all new prescriptions of medicines with your caregiver. Ask your caregiver which drugs you should avoid. The following drugs are toxic to the liver, and your caregiver may tell you to avoid them: °· Isoniazid. °· Methyldopa. °· Acetaminophen. °· Anabolic steroids (muscle-building drugs). °· Erythromycin. °· Oral contraceptives (birth control pills). °· Check with your caregiver to make sure medicine you are currently taking will not be harmful. °· Periodic blood tests may be required. Follow your caregiver's advice about when you should have blood tests. °· Avoid a sexual relationship until advised otherwise by your caregiver. °· Avoid activities that could expose other people to your blood. Examples include sharing a toothbrush, nail clippers, razors, and needles. °· Bed rest is not necessary, but it may make you feel better. Recovery time is not related to the amount of rest you receive. °· This infection is contagious. Follow your caregiver's instructions in order to avoid spread of the infection. °SEEK IMMEDIATE MEDICAL CARE IF: °· You have increasing fatigue or weakness. °· You have an oral temperature above 102° F (38.9° C), not controlled by medicine. °· You develop loss of appetite, nausea, or vomiting. °· You develop jaundice. °· You develop easy bruising or bleeding. °· You develop any severe problems as a result of your treatment. °MAKE SURE YOU:  °· Understand these instructions. °· Will watch your condition. °· Will get help right away if you are not doing well or get worse. °Document Released: 02/26/2000 Document Revised: 05/23/2011 Document Reviewed: 06/30/2010 °Exit[employer]® Patient Information ©2014 [employer]. ° °

## 2012-12-19 NOTE — Telephone Encounter (Signed)
gv and printed appt sched and avs for pt for OCT 2015...gv pt information to Dr. Christella Hartigan office so medical records form Dr. Adriana Simas off could be faxed over..the patient will sched appt.
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
## 2012-12-27 ENCOUNTER — Telehealth (HOSPITAL_COMMUNITY): Payer: Self-pay | Admitting: *Deleted

## 2012-12-27 ENCOUNTER — Encounter: Payer: Self-pay | Admitting: Gastroenterology

## 2013-01-15 ENCOUNTER — Telehealth: Payer: Self-pay | Admitting: Gastroenterology

## 2013-01-15 ENCOUNTER — Other Ambulatory Visit (HOSPITAL_COMMUNITY): Payer: Self-pay | Admitting: Physician Assistant

## 2013-01-15 DIAGNOSIS — I6529 Occlusion and stenosis of unspecified carotid artery: Secondary | ICD-10-CM

## 2013-01-15 DIAGNOSIS — M542 Cervicalgia: Secondary | ICD-10-CM

## 2013-01-15 NOTE — Telephone Encounter (Signed)
Rec'd from [facility] forward 72 pages to Dr.Jacobs

## 2013-01-22 ENCOUNTER — Telehealth (HOSPITAL_COMMUNITY): Payer: Self-pay | Admitting: *Deleted

## 2013-01-24 ENCOUNTER — Encounter (HOSPITAL_COMMUNITY): Payer: Medicaid Other

## 2013-01-24 ENCOUNTER — Encounter (HOSPITAL_COMMUNITY): Payer: Self-pay | Admitting: *Deleted

## 2013-02-04 ENCOUNTER — Telehealth: Payer: Self-pay | Admitting: Medical Oncology

## 2013-02-04 NOTE — Telephone Encounter (Signed)
Pt called and states that Dr. Ewing Schlein will no longer see him. He was referred to Dr. Christella Hartigan for his hepatitis. Dr. Christella Hartigan office is now referring him to [facility]. He states that Dr. Marlane Hatcher office will not fax his records. I see in Epic where pt's records from Dr. Marlane Hatcher office were faxed to Dr. Larae Grooms office. I left a message with medical records in Dr. Larae Grooms office to forward these records on to [facility].

## 2013-02-05 ENCOUNTER — Telehealth (HOSPITAL_COMMUNITY): Payer: Self-pay | Admitting: *Deleted

## 2013-02-15 ENCOUNTER — Ambulatory Visit (AMBULATORY_SURGERY_CENTER): Payer: Medicaid Other

## 2013-02-15 ENCOUNTER — Encounter: Payer: Self-pay | Admitting: Medical Oncology

## 2013-02-15 VITALS — Ht 71.0 in | Wt 222.0 lb

## 2013-02-15 DIAGNOSIS — Z85038 Personal history of other malignant neoplasm of large intestine: Secondary | ICD-10-CM

## 2013-02-15 MED ORDER — MOVIPREP 100 G PO SOLR: 1.0000 | Freq: Once | ORAL | Status: DC

## 2013-02-15 NOTE — Progress Notes (Signed)
02/12/13 received a confirmation from [facility] for referral. They will contact the patient and our office when appointment is scheduled.

## 2013-02-22 ENCOUNTER — Encounter: Payer: Self-pay | Admitting: Gastroenterology

## 2013-02-22 ENCOUNTER — Ambulatory Visit (AMBULATORY_SURGERY_CENTER): Payer: Medicaid Other | Admitting: Gastroenterology

## 2013-02-22 VITALS — BP 82/67 | HR 75 | Temp 97.0°F | Resp 29 | Ht 71.0 in | Wt 222.0 lb

## 2013-02-22 DIAGNOSIS — D126 Benign neoplasm of colon, unspecified: Secondary | ICD-10-CM

## 2013-02-22 DIAGNOSIS — Z85038 Personal history of other malignant neoplasm of large intestine: Secondary | ICD-10-CM

## 2013-02-22 MED ORDER — SODIUM CHLORIDE 0.9 % IV SOLN: 500.0000 mL | INTRAVENOUS | Status: DC

## 2013-02-22 NOTE — Progress Notes (Addendum)
Patient reports he had a half of an egg at 0700 this am, no other foods and last water at 1100. Discussed with Kathlene November CRNA, cleared for anesthesia.informed Dr. Jarold Motto.

## 2013-02-22 NOTE — Progress Notes (Signed)
Called to room to assist during endoscopic procedure.  Patient ID and intended procedure confirmed with present staff. Received instructions for my participation in the procedure from the performing physician.  

## 2013-02-22 NOTE — Progress Notes (Signed)
Patient did not experience any of the following events: a burn prior to discharge; a fall within the facility; wrong site/side/patient/procedure/implant event; or a hospital transfer or hospital admission upon discharge from the facility. (G8907) Patient did not have preoperative order for IV antibiotic SSI prophylaxis. ([id number])  

## 2013-02-22 NOTE — Patient Instructions (Signed)
YOU HAD AN ENDOSCOPIC PROCEDURE TODAY AT THE [facility]: Refer to the procedure report that was given to you for any specific questions about what was found during the examination.  If the procedure report does not answer your questions, please call your gastroenterologist to clarify.  If you requested that your care partner not be given the details of your procedure findings, then the procedure report has been included in a sealed envelope for you to review at your convenience later.  YOU SHOULD EXPECT: Some feelings of bloating in the abdomen. Passage of more gas than usual.  Walking can help get rid of the air that was put into your GI tract during the procedure and reduce the bloating. If you had a lower endoscopy (such as a colonoscopy or flexible sigmoidoscopy) you may notice spotting of blood in your stool or on the toilet paper. If you underwent a bowel prep for your procedure, then you may not have a normal bowel movement for a few days.  DIET: Your first meal following the procedure should be a light meal and then it is ok to progress to your normal diet.  A half-sandwich or bowl of soup is an example of a good first meal.  Heavy or fried foods are harder to digest and may make you feel nauseous or bloated.  Likewise meals heavy in dairy and vegetables can cause extra gas to form and this can also increase the bloating.  Drink plenty of fluids but you should avoid alcoholic beverages for 24 hours.  ACTIVITY: Your care partner should take you home directly after the procedure.  You should plan to take it easy, moving slowly for the rest of the day.  You can resume normal activity the day after the procedure however you should NOT DRIVE or use heavy machinery for 24 hours (because of the sedation medicines used during the test).    SYMPTOMS TO REPORT IMMEDIATELY: A gastroenterologist can be reached at any hour.  During normal business hours, 8:30 AM to 5:00 PM Monday through Friday,  call [phone].  After hours and on weekends, please call the GI answering service at [phone] who will take a message and have the physician on call contact you.   Following lower endoscopy (colonoscopy or flexible sigmoidoscopy):  Excessive amounts of blood in the stool  Significant tenderness or worsening of abdominal pains  Swelling of the abdomen that is new, acute  Fever of 100F or higher    FOLLOW UP: If any biopsies were taken you will be contacted by phone or by letter within the next 1-3 weeks.  Call your gastroenterologist if you have not heard about the biopsies in 3 weeks.  Our staff will call the home number listed on your records the next business day following your procedure to check on you and address any questions or concerns that you may have at that time regarding the information given to you following your procedure. This is a courtesy call and so if there is no answer at the home number and we have not heard from you through the emergency physician on call, we will assume that you have returned to your regular daily activities without incident.  SIGNATURES/CONFIDENTIALITY: You and/or your care partner have signed paperwork which will be entered into your electronic medical record.  These signatures attest to the fact that that the information above on your After Visit Summary has been reviewed and is understood.  Full responsibility of the confidentiality   of this discharge information lies with you and/or your care-partner.  Polyp information given.  Next colonoscopy 5 years 2019.  Continue current medications.

## 2013-02-22 NOTE — Progress Notes (Signed)
Procedure ends, to recovery, report given and VSS. 

## 2013-02-22 NOTE — Op Note (Signed)
[facility] [address] [place], [address]   COLONOSCOPY PROCEDURE REPORT  PATIENT: [name], [name]  MR#: [id number] BIRTHDATE: January 02, 1951 , 62  yrs. old GENDER: Male ENDOSCOPIST: Mardella Layman, MD, [facility] REFERRED BY: PROCEDURE DATE:  02/22/2013 PROCEDURE:   Colonoscopy with snare polypectomy First Screening Colonoscopy - Avg.  risk and is 50 yrs.  old or older - No.  Prior Negative Screening - Now for repeat screening. N/A  History of Adenoma - Now for follow-up colonoscopy & has been > or = to 3 yrs.  Yes hx of adenoma.  Has been 3 or more years since last colonoscopy.  Polyps Removed Today? Yes. ASA CLASS:   Class III INDICATIONS:Patient's personal history of adenomatous colon polyps and elevated risk screening.   Hx. of colon cancer and surgery many years ago. MEDICATIONS: Propofol (Diprivan) 230 mg IV  DESCRIPTION OF PROCEDURE:   After the risks benefits and alternatives of the procedure were thoroughly explained, informed consent was obtained.  A digital rectal exam revealed no abnormalities of the rectum.   The LB [id number]  endoscope was introduced through the anus and advanced to the cecum, which was identified by both the appendix and ileocecal valve. No adverse events experienced.   The quality of the prep was excellent, using MoviPrep  The instrument was then slowly withdrawn as the colon was fully examined.      COLON FINDINGS: A smooth sessile polyp ranging between 3-20mm in size was found at the cecum.  A polypectomy was performed using snare cautery.  The resection was complete and the polyp tissue was not retrieved.  Retroflexed views revealed no abnormalities. The time to cecum=1 minutes 29 seconds.  Withdrawal time=10 minutes 53 seconds.  The scope was withdrawn and the procedure completed. COMPLICATIONS: There were no complications.  ENDOSCOPIC IMPRESSION: Sessile polyp ranging between 3-31mm in size was found at the  cecum; polypectomy was performed using snare cautery....hx. here of colon cancer and prior surgery and chemotherapy.  RECOMMENDATIONS: 1.  Repeat Colonoscopy in 5 years. 2.  Continue current medications   eSigned:  Mardella Layman, MD, [facility] 02/22/2013 1:58 PM   cc:   PATIENT NAME:  [name], [name] MR#: [id number]

## 2013-02-25 ENCOUNTER — Telehealth: Payer: Self-pay | Admitting: *Deleted

## 2013-02-25 NOTE — Telephone Encounter (Signed)
  Follow up Call-  Call back number 02/22/2013  Post procedure Call Back phone  # [phone]  Permission to leave phone message Yes     Patient questions:  Do you have a fever, pain , or abdominal swelling? no Pain Score  0 *  Have you tolerated food without any problems? yes  Have you been able to return to your normal activities? yes  Do you have any questions about your discharge instructions: Diet   no Medications  no Follow up visit  no  Do you have questions or concerns about your Care? no  Actions: * If pain score is 4 or above: No action needed, pain <4.

## 2013-02-25 NOTE — Telephone Encounter (Signed)
  Follow up Call-  Call back number 02/22/2013  Post procedure Call Back phone  # [phone]  Permission to leave phone message Yes     Patient questions:  Do you have a fever, pain , or abdominal swelling? no Pain Score  0 *  Have you tolerated food without any problems? yes  Have you been able to return to your normal activities? yes  Do you have any questions about your discharge instructions: Diet   no Medications  no Follow up visit  no  Do you have questions or concerns about your Care? no  Actions: * If pain score is 4 or above: No action needed, pain <4.   

## 2013-02-28 ENCOUNTER — Telehealth: Payer: Self-pay | Admitting: Medical Oncology

## 2013-02-28 ENCOUNTER — Telehealth: Payer: Self-pay | Admitting: Gastroenterology

## 2013-02-28 NOTE — Telephone Encounter (Signed)
Spoke with Dr Benjiman Core nurse, Felicita Gage, who states pt came here only for his COLON. Zella Ball has referred pt to the [facility] at [facility] but she doesn't know if he has an appt yet. Finally reached pt and he states he has his appt at [facility], but it's not until February and he wanted to know if we could get him an earlier appt. Informed him that about the usual turn around for an appt; pt stated understanding.

## 2013-02-28 NOTE — Telephone Encounter (Signed)
lmon for Dr [employer] nurse to call back about pt. Tried to call the pt; number ranger then cut off.

## 2013-02-28 NOTE — Telephone Encounter (Signed)
Received a call from Burnice Logan (Dr[facility] Gable Office) asking if we know anything about pt being seen for his Hep C. I explained that Dr. Rosie Fate referred him to the [facility]. We did receive a confirmation they received records but there is no documentation regarding appt. She was able to speak with pt and he does have an appointment 04/20/13.

## 2013-03-04 ENCOUNTER — Encounter: Payer: Self-pay | Admitting: Medical Oncology

## 2013-03-04 NOTE — Progress Notes (Signed)
Received a confirmation from [place] with appointment  04/08/13 at 10:00am. They did notify the pt.

## 2013-04-17 ENCOUNTER — Other Ambulatory Visit: Payer: Self-pay | Admitting: Internal Medicine

## 2013-04-17 DIAGNOSIS — C220 Liver cell carcinoma: Secondary | ICD-10-CM

## 2013-04-23 ENCOUNTER — Other Ambulatory Visit: Payer: Medicaid Other

## 2013-04-25 ENCOUNTER — Ambulatory Visit
Admission: RE | Admit: 2013-04-25 | Discharge: 2013-04-25 | Disposition: A | Discharge status: Home / Self Care | Payer: Medicaid Other | Source: Ambulatory Visit | Attending: Internal Medicine | Admitting: Internal Medicine

## 2013-04-25 DIAGNOSIS — C220 Liver cell carcinoma: Secondary | ICD-10-CM

## 2013-08-02 ENCOUNTER — Emergency Department (HOSPITAL_COMMUNITY): Payer: Medicaid Other

## 2013-08-02 ENCOUNTER — Encounter (HOSPITAL_COMMUNITY): Payer: Self-pay | Admitting: Emergency Medicine

## 2013-08-02 ENCOUNTER — Inpatient Hospital Stay (HOSPITAL_COMMUNITY)
Admission: EM | Admit: 2013-08-02 | Discharge: 2013-08-04 | DRG: 303 | Disposition: A | Discharge status: Home / Self Care | Payer: Medicaid Other | Attending: Cardiovascular Disease | Admitting: Cardiovascular Disease

## 2013-08-02 DIAGNOSIS — E7800 Pure hypercholesterolemia, unspecified: Secondary | ICD-10-CM | POA: Diagnosis present

## 2013-08-02 DIAGNOSIS — Z9861 Coronary angioplasty status: Secondary | ICD-10-CM

## 2013-08-02 DIAGNOSIS — I10 Essential (primary) hypertension: Secondary | ICD-10-CM | POA: Diagnosis present

## 2013-08-02 DIAGNOSIS — Z96659 Presence of unspecified artificial knee joint: Secondary | ICD-10-CM

## 2013-08-02 DIAGNOSIS — F17200 Nicotine dependence, unspecified, uncomplicated: Secondary | ICD-10-CM | POA: Diagnosis present

## 2013-08-02 DIAGNOSIS — Z8249 Family history of ischemic heart disease and other diseases of the circulatory system: Secondary | ICD-10-CM

## 2013-08-02 DIAGNOSIS — I249 Acute ischemic heart disease, unspecified: Secondary | ICD-10-CM

## 2013-08-02 DIAGNOSIS — I2510 Atherosclerotic heart disease of native coronary artery without angina pectoris: Principal | ICD-10-CM | POA: Diagnosis present

## 2013-08-02 DIAGNOSIS — B1920 Unspecified viral hepatitis C without hepatic coma: Secondary | ICD-10-CM | POA: Diagnosis present

## 2013-08-02 DIAGNOSIS — Z85038 Personal history of other malignant neoplasm of large intestine: Secondary | ICD-10-CM

## 2013-08-02 DIAGNOSIS — I200 Unstable angina: Secondary | ICD-10-CM | POA: Diagnosis present

## 2013-08-02 LAB — COMPREHENSIVE METABOLIC PANEL
ALK PHOS: 109 U/L (ref 39–117)
ALT: 20 U/L (ref 0–53)
AST: 23 U/L (ref 0–37)
Albumin: 3.4 g/dL — ABNORMAL LOW (ref 3.5–5.2)
BILIRUBIN TOTAL: 0.3 mg/dL (ref 0.3–1.2)
BUN: 15 mg/dL (ref 6–23)
CHLORIDE: 103 meq/L (ref 96–112)
CO2: 21 mEq/L (ref 19–32)
Calcium: 9.2 mg/dL (ref 8.4–10.5)
Creatinine, Ser: 1.15 mg/dL (ref 0.50–1.35)
GFR calc Af Amer: 78 mL/min — ABNORMAL LOW (ref >90)
GFR, EST NON AFRICAN AMERICAN: 67 mL/min — AB (ref >90)
Glucose, Bld: 103 mg/dL — ABNORMAL HIGH (ref 70–99)
Potassium: 3.9 mEq/L (ref 3.7–5.3)
Sodium: 137 mEq/L (ref 137–147)
TOTAL PROTEIN: 7.2 g/dL (ref 6.0–8.3)

## 2013-08-02 LAB — CBC
HCT: 42.7 % (ref 39.0–52.0)
Hemoglobin: 14.3 g/dL (ref 13.0–17.0)
MCH: 32.3 pg (ref 26.0–34.0)
MCHC: 33.5 g/dL (ref 30.0–36.0)
MCV: 96.4 fL (ref 78.0–100.0)
Platelets: 216 10*3/uL (ref 150–400)
RBC: 4.43 MIL/uL (ref 4.22–5.81)
RDW: 13.6 % (ref 11.5–15.5)
WBC: 7.9 10*3/uL (ref 4.0–10.5)

## 2013-08-02 LAB — I-STAT TROPONIN, ED: TROPONIN I, POC: 0.07 ng/mL (ref 0.00–0.08)

## 2013-08-02 MED ORDER — MORPHINE SULFATE 2 MG/ML IJ SOLN
1.0000 mg | INTRAMUSCULAR | Status: DC | PRN
  Administered 2013-08-02 – 2013-08-04 (×4): 2 mg via INTRAVENOUS
  Filled 2013-08-02 (×5): qty 1

## 2013-08-02 MED ORDER — SODIUM CHLORIDE 0.9 % IV SOLN: 250.0000 mL | INTRAVENOUS | Status: DC | PRN

## 2013-08-02 MED ORDER — MORPHINE SULFATE 4 MG/ML IJ SOLN
4.0000 mg | Freq: Once | INTRAMUSCULAR | Status: AC
  Administered 2013-08-02: 4 mg via INTRAVENOUS
  Filled 2013-08-02: qty 1

## 2013-08-02 MED ORDER — OMBITAS-PARITAPRE-RITONA-DASAB 12.5-75-50 &250 MG PO TBPK
1.0000 | ORAL_TABLET | Freq: Two times a day (BID) | ORAL | Status: DC
  Administered 2013-08-02: 1 via ORAL
  Administered 2013-08-03 (×2): via ORAL
  Administered 2013-08-04: 1 via ORAL
  Filled 2013-08-02 (×4): qty 1

## 2013-08-02 MED ORDER — NITROGLYCERIN IN D5W 200-5 MCG/ML-% IV SOLN
10.0000 ug/min | INTRAVENOUS | Status: DC
Start: 2013-08-02 — End: 2013-08-04
  Administered 2013-08-02: 10 ug/min via INTRAVENOUS
  Filled 2013-08-02: qty 250

## 2013-08-02 MED ORDER — ACETAMINOPHEN 325 MG PO TABS: 650.0000 mg | ORAL_TABLET | ORAL | Status: DC | PRN

## 2013-08-02 MED ORDER — SODIUM CHLORIDE 0.9 % IJ SOLN: 3.0000 mL | INTRAMUSCULAR | Status: DC | PRN

## 2013-08-02 MED ORDER — AMLODIPINE BESYLATE 5 MG PO TABS
5.0000 mg | ORAL_TABLET | Freq: Every day | ORAL | Status: DC
  Administered 2013-08-02 – 2013-08-04 (×3): 5 mg via ORAL
  Filled 2013-08-02 (×3): qty 1

## 2013-08-02 MED ORDER — HEPARIN (PORCINE) IN NACL 100-0.45 UNIT/ML-% IJ SOLN
1300.0000 [IU]/h | INTRAMUSCULAR | Status: DC
  Administered 2013-08-02 – 2013-08-03 (×3): 1300 [IU]/h via INTRAVENOUS
  Filled 2013-08-02 (×4): qty 250

## 2013-08-02 MED ORDER — ASPIRIN 81 MG PO CHEW
324.0000 mg | CHEWABLE_TABLET | Freq: Once | ORAL | Status: AC
  Administered 2013-08-02: 324 mg via ORAL
  Filled 2013-08-02: qty 4

## 2013-08-02 MED ORDER — OMBITAS-PARITAPRE-RITONA-DASAB 12.5-75-50 &250 MG PO TBPK
1.0000 | ORAL_TABLET | Freq: Two times a day (BID) | ORAL | Status: DC
  Filled 2013-08-02 (×2): qty 1

## 2013-08-02 MED ORDER — NITROGLYCERIN 0.4 MG SL SUBL: 0.4000 mg | SUBLINGUAL_TABLET | SUBLINGUAL | Status: DC | PRN

## 2013-08-02 MED ORDER — ASPIRIN 81 MG PO CHEW
324.0000 mg | CHEWABLE_TABLET | ORAL | Status: AC
  Administered 2013-08-02: 324 mg via ORAL
  Filled 2013-08-02: qty 4

## 2013-08-02 MED ORDER — METOPROLOL TARTRATE 1 MG/ML IV SOLN
5.0000 mg | Freq: Once | INTRAVENOUS | Status: DC
  Filled 2013-08-02 (×2): qty 5

## 2013-08-02 MED ORDER — ONDANSETRON HCL 4 MG/2ML IJ SOLN: 4.0000 mg | Freq: Four times a day (QID) | INTRAMUSCULAR | Status: DC | PRN

## 2013-08-02 MED ORDER — HEPARIN BOLUS VIA INFUSION
4000.0000 [IU] | Freq: Once | INTRAVENOUS | Status: AC
  Administered 2013-08-02: 4000 [IU] via INTRAVENOUS
  Filled 2013-08-02: qty 4000

## 2013-08-02 MED ORDER — ONDANSETRON HCL 4 MG/2ML IJ SOLN
4.0000 mg | Freq: Once | INTRAMUSCULAR | Status: AC
  Administered 2013-08-02: 4 mg via INTRAVENOUS
  Filled 2013-08-02: qty 2

## 2013-08-02 MED ORDER — SODIUM CHLORIDE 0.9 % IJ SOLN
3.0000 mL | Freq: Two times a day (BID) | INTRAMUSCULAR | Status: DC
  Administered 2013-08-03: 3 mL via INTRAVENOUS

## 2013-08-02 MED ORDER — ASPIRIN 300 MG RE SUPP
300.0000 mg | RECTAL | Status: AC
  Filled 2013-08-02: qty 1

## 2013-08-02 MED ORDER — RIBAVIRIN 200 MG PO CAPS
600.0000 mg | ORAL_CAPSULE | Freq: Two times a day (BID) | ORAL | Status: DC
  Filled 2013-08-02: qty 3

## 2013-08-02 MED ORDER — ASPIRIN EC 81 MG PO TBEC
81.0000 mg | DELAYED_RELEASE_TABLET | Freq: Every day | ORAL | Status: DC
  Administered 2013-08-03 – 2013-08-04 (×2): 81 mg via ORAL
  Filled 2013-08-02 (×2): qty 1

## 2013-08-02 MED ORDER — METOPROLOL SUCCINATE ER 25 MG PO TB24
25.0000 mg | ORAL_TABLET | Freq: Every day | ORAL | Status: DC
  Administered 2013-08-03 – 2013-08-04 (×2): 25 mg via ORAL
  Filled 2013-08-02 (×2): qty 1

## 2013-08-02 MED ORDER — RIBAVIRIN 200 MG PO CAPS
600.0000 mg | ORAL_CAPSULE | Freq: Two times a day (BID) | ORAL | Status: DC
  Administered 2013-08-02 – 2013-08-04 (×4): 600 mg via ORAL
  Filled 2013-08-02 (×7): qty 3

## 2013-08-02 NOTE — ED Notes (Signed)
Patient transported to X-ray 

## 2013-08-02 NOTE — Progress Notes (Signed)
ANTICOAGULATION CONSULT NOTE - Initial Consult  Pharmacy Consult for heparin Indication: chest pain/ACS  Allergies  Allergen Reactions  . Sulfonamide Derivatives Rash and Other (See Comments)    REACTION: penile irritation    Patient Measurements: Height: 5\' 11"  (180.3 cm) Weight: 209 lb (94.802 kg) IBW/kg (Calculated) : 75.3 Heparin Dosing Weight: 94 kg  Vital Signs: Temp: 97.4 F (36.3 C) (05/22 1046) Temp src: Oral (05/22 1046) BP: 136/73 mmHg (05/22 1515) Pulse Rate: 54 (05/22 1515)  Labs:  Recent Labs  08/02/13 1115  HGB 14.3  HCT 42.7  PLT 216  CREATININE 1.15    Estimated Creatinine Clearance: 79.3 ml/min (by C-G formula based on Cr of 1.15).   Medical History: Past Medical History  Diagnosis Date  . Hypertension   . Shortness of breath   . Hepatitis     c  . Osteoarthritis of left knee 06/21/2011  . Coronary artery disease   . Anginal pain   . Cancer     colon    Medications:  Prescriptions prior to admission  Medication Sig Dispense Refill  . metoprolol succinate (TOPROL-XL) 25 MG 24 hr tablet Take 25 mg by mouth daily.      . nitroGLYCERIN (NITROSTAT) 0.4 MG SL tablet Place 0.4 mg under the tongue every 5 (five) minutes as needed for chest pain.      . Ombitas-Paritapre-Ritona-Dasab (VIEKIRA PAK) 12.5-75-50 &250 MG TBPK Take 1 packet by mouth daily.      . ribavirin (REBETOL) 200 MG capsule Take 600 mg by mouth 2 (two) times daily.        Assessment: 63 year old man admitted to [facility] for ACS.  IV heparin infusion to start. Goal of Therapy:  Heparin level 0.3-0.7 units/ml Monitor platelets by anticoagulation protocol: Yes   Plan:  Give 4000 units bolus x 1 Start heparin infusion at 1300 units/hr Check anti-Xa level in 6 hours and daily while on heparin Continue to monitor H&H and platelets  Gearldine Bienenstock Indea Dearman 08/02/2013,5:12 PM

## 2013-08-02 NOTE — H&P (Signed)
Referring Physician: Charolette Forward, MD  [name] [name] is an 63 y.o. male.                       Chief Complaint: Chest pain HPI: 63 year old male has left precordial sharp, non-radiating chest pain improving with NTG and morphine use. Has diaphoresis and shortness of breath. Risk factors include coronary artery disease, high cholesterol, hypertension, male sex and smoking. History of mid-LAD 3.0x 18 Xience Xpedition stent on 11/24/2011.   Past Medical History  Diagnosis Date  . Hypertension   . Shortness of breath   . Hepatitis     c  . Osteoarthritis of left knee 06/21/2011  . Coronary artery disease   . Anginal pain   . Cancer     colon      Past Surgical History  Procedure Laterality Date  . Cardiac catheterization      2005   dr Romilda Garret  . Joint replacement      rt knee  . Colon surgery      2002   colon  ca   dr Bubba Camp      chemo  tx  . Replacement total knee      bilateral knee replacements  . Coronary angioplasty      Family History  Problem Relation Age of Onset  . Hypertension Mother   . Colon cancer Neg Hx    Social History:  reports that he has been smoking Cigarettes.  He has a 1.75 pack-year smoking history. He has never used smokeless tobacco. He reports that he drinks about 2.4 ounces of alcohol per week. He reports that he does not use illicit drugs.  Allergies:  Allergies  Allergen Reactions  . Sulfonamide Derivatives Rash and Other (See Comments)    REACTION: penile irritation     (Not in a hospital admission)  Results for orders placed during the hospital encounter of 08/02/13 (from the past 48 hour(s))  CBC     Status: None   Collection Time    08/02/13 11:15 AM      Result Value Ref Range   WBC 7.9  4.0 - 10.5 K/uL   RBC 4.43  4.22 - 5.81 MIL/uL   Hemoglobin 14.3  13.0 - 17.0 g/dL   HCT 42.7  39.0 - 52.0 %   MCV 96.4  78.0 - 100.0 fL   MCH 32.3  26.0 - 34.0 pg   MCHC 33.5  30.0 - 36.0 g/dL   RDW 13.6  11.5 - 15.5 %   Platelets 216   150 - 400 K/uL  COMPREHENSIVE METABOLIC PANEL     Status: Abnormal   Collection Time    08/02/13 11:15 AM      Result Value Ref Range   Sodium 137  137 - 147 mEq/L   Potassium 3.9  3.7 - 5.3 mEq/L   Chloride 103  96 - 112 mEq/L   CO2 21  19 - 32 mEq/L   Glucose, Bld 103 (*) 70 - 99 mg/dL   BUN 15  6 - 23 mg/dL   Creatinine, Ser 1.15  0.50 - 1.35 mg/dL   Calcium 9.2  8.4 - 10.5 mg/dL   Total Protein 7.2  6.0 - 8.3 g/dL   Albumin 3.4 (*) 3.5 - 5.2 g/dL   AST 23  0 - 37 U/L   ALT 20  0 - 53 U/L   Alkaline Phosphatase 109  39 - 117 U/L  Total Bilirubin 0.3  0.3 - 1.2 mg/dL   GFR calc non Af Amer 67 (*) >90 mL/min   GFR calc Af Amer 78 (*) >90 mL/min   Comment: (NOTE)     The eGFR has been calculated using the CKD EPI equation.     This calculation has not been validated in all clinical situations.     eGFR's persistently <90 mL/min signify possible Chronic Kidney     Disease.  Randolm Idol, ED     Status: None   Collection Time    08/02/13 11:48 AM      Result Value Ref Range   Troponin i, poc 0.07  0.00 - 0.08 ng/mL   Comment 3            Comment: Due to the release kinetics of cTnI,     a negative result within the first hours     of the onset of symptoms does not rule out     myocardial infarction with certainty.     If myocardial infarction is still suspected,     repeat the test at appropriate intervals.   Dg Chest 2 View  08/02/2013   CLINICAL DATA:  Chest pain  EXAM: CHEST  2 VIEW  COMPARISON:  06/15/2011  FINDINGS: Normal heart size, mediastinal contours, and pulmonary vascularity.  Mild elongation of thoracic aorta.  Lungs clear.  No pleural effusion or pneumothorax.  No acute osseous findings.  IMPRESSION: No acute abnormalities   Electronically Signed   By: Lavonia Dana M.D.   On: 08/02/2013 11:31    Review of Systems  Constitutional: Positive for diaphoresis.  Respiratory: Positive for shortness of breath. Negative for cough.  Cardiovascular: Positive for  chest pain.  Gastrointestinal: Negative for nausea and abdominal pain.  Neurological: Negative for weakness.  All other systems reviewed and are negative.   Blood pressure 187/133, pulse 60, temperature 97.4 F (36.3 C), temperature source Oral, resp. rate 25, height '5\' 11"'  (1.803 m), weight 94.802 kg (209 lb), SpO2 98.00%. Physical Exam   Constitutional: He appears well-developed and well-nourished. No distress.  HENT: Head: Normocephalic and atraumatic. Mouth/Throat: Oropharynx is clear and moist.  Eyes: Brown, Conjunctivae and EOM are normal. Pupils are equal, round, and reactive to light. Midline pink tongue. Neck: Normal range of motion. Neck supple.  Cardiovascular: Normal rate, regular rhythm and intact distal pulses. No murmur heard.  Pulmonary/Chest: Effort normal and breath sounds normal.   Abdominal: Soft. He exhibits no distension. There is no tenderness.   Musculoskeletal: Normal range of motion. He exhibits no edema and no tenderness.  Neurological: He is alert and oriented to person, place, and time.  Skin: Skin is warm and dry. No rash noted. No erythema.  Psychiatric: He has a normal mood and affect. His behavior is normal.    Assessment/Plan Acute coronary syndrome R/O MI CAD S/P LAD stent Hypertension S/P Colon cancer Hepatitis C with active treatment (Off antilipemic agent)  Admit R/O MI IV NTG and amlodipine for blood pressure and chest pain. Nuclear stress test v/s cardiac cath.  Birdie Riddle 08/02/2013, 1:11 PM

## 2013-08-02 NOTE — ED Notes (Signed)
Pt is sleeping

## 2013-08-02 NOTE — ED Notes (Signed)
Attempted report 

## 2013-08-02 NOTE — ED Notes (Signed)
IV attempt x 2 unsuccessful.

## 2013-08-02 NOTE — ED Provider Notes (Addendum)
CSN: [id number]     Arrival date & time 08/02/13  1042 History   First MD Initiated Contact with Patient 08/02/13 1109     Chief Complaint  Patient presents with  . Chest Pain     (Consider location/radiation/quality/duration/timing/severity/associated sxs/prior Treatment) Patient is a 63 y.o. male presenting with chest pain. The history is provided by the patient.  Chest Pain Pain location:  L chest Pain quality: aching and sharp   Pain radiates to:  Does not radiate Pain radiates to the back: no   Pain severity:  Moderate Onset quality:  Gradual Duration:  2 hours Timing:  Constant Progression:  Improving Chronicity:  New Context comment:  Started about 15-49min after taking his anti-hep C meds that he started on monday Relieved by:  Nothing Worsened by:  Nothing tried Ineffective treatments:  Nitroglycerin Associated symptoms: diaphoresis and shortness of breath   Associated symptoms: no abdominal pain, no cough, no nausea and no weakness   Risk factors: coronary artery disease, high cholesterol, hypertension, male sex and smoking   Risk factors: no immobilization, no prior DVT/PE and no surgery     Past Medical History  Diagnosis Date  . Hypertension   . Shortness of breath   . Hepatitis     c  . Osteoarthritis of left knee 06/21/2011  . Coronary artery disease   . Anginal pain   . Cancer     colon   Past Surgical History  Procedure Laterality Date  . Cardiac catheterization      2005   dr Romilda Garret  . Joint replacement      rt knee  . Colon surgery      2002   colon  ca   dr Bubba Camp      chemo  tx  . Replacement total knee      bilateral knee replacements  . Coronary angioplasty     Family History  Problem Relation Age of Onset  . Hypertension Mother   . Colon cancer Neg Hx    History  Substance Use Topics  . Smoking status: Current Every Day Smoker -- 0.25 packs/day for 7 years    Types: Cigarettes    Last Attempt to Quit: 11/23/2011  . Smokeless  tobacco: Never Used  . Alcohol Use: 2.4 oz/week    4 Cans of beer per week     Comment: oct   2012    Review of Systems  Constitutional: Positive for diaphoresis.  Respiratory: Positive for shortness of breath. Negative for cough.   Cardiovascular: Positive for chest pain.  Gastrointestinal: Negative for nausea and abdominal pain.  Neurological: Negative for weakness.  All other systems reviewed and are negative.     Allergies  Sulfonamide derivatives  Home Medications   Prior to Admission medications   Medication Sig Start Date End Date Taking? Authorizing Provider  metoprolol succinate (TOPROL-XL) 25 MG 24 hr tablet Take 25 mg by mouth daily.   Yes Historical Provider, MD  nitroGLYCERIN (NITROSTAT) 0.4 MG SL tablet Place 0.4 mg under the tongue every 5 (five) minutes as needed for chest pain.   Yes Historical Provider, MD  Ombitas-Paritapre-Ritona-Dasab (VIEKIRA PAK) 12.5-75-50 &250 MG TBPK Take 1 packet by mouth daily.   Yes Historical Provider, MD  ribavirin (REBETOL) 200 MG capsule Take 600 mg by mouth 2 (two) times daily.   Yes Historical Provider, MD   BP 169/117  Pulse 56  Temp(Src) 97.4 F (36.3 C) (Oral)  Resp 24  Ht 5\' 11"  (  1.803 m)  Wt 209 lb (94.802 kg)  BMI 29.16 kg/m2  SpO2 98% Physical Exam  Nursing note and vitals reviewed. Constitutional: He is oriented to person, place, and time. He appears well-developed and well-nourished. No distress.  HENT:  Head: Normocephalic and atraumatic.  Mouth/Throat: Oropharynx is clear and moist.  Eyes: Conjunctivae and EOM are normal. Pupils are equal, round, and reactive to light.  Neck: Normal range of motion. Neck supple.  Cardiovascular: Normal rate, regular rhythm and intact distal pulses.   No murmur heard. Pulmonary/Chest: Effort normal and breath sounds normal. No respiratory distress. He has no wheezes. He has no rales.  Abdominal: Soft. He exhibits no distension. There is no tenderness. There is no rebound  and no guarding.  Musculoskeletal: Normal range of motion. He exhibits no edema and no tenderness.  Neurological: He is alert and oriented to person, place, and time.  Skin: Skin is warm and dry. No rash noted. No erythema.  Psychiatric: He has a normal mood and affect. His behavior is normal.    ED Course  Procedures (including critical care time) Labs Review Labs Reviewed  COMPREHENSIVE METABOLIC PANEL - Abnormal; Notable for the following:    Glucose, Bld 103 (*)    Albumin 3.4 (*)    GFR calc non Af Amer 67 (*)    GFR calc Af Amer 78 (*)    All other components within normal limits  CBC  I-STAT TROPOININ, ED    Imaging Review Dg Chest 2 View  08/02/2013   CLINICAL DATA:  Chest pain  EXAM: CHEST  2 VIEW  COMPARISON:  06/15/2011  FINDINGS: Normal heart size, mediastinal contours, and pulmonary vascularity.  Mild elongation of thoracic aorta.  Lungs clear.  No pleural effusion or pneumothorax.  No acute osseous findings.  IMPRESSION: No acute abnormalities   Electronically Signed   By: Lavonia Dana M.D.   On: 08/02/2013 11:31     EKG Interpretation   Date/Time:  Friday Aug 02 2013 10:44:38 EDT Ventricular Rate:  53 PR Interval:  166 QRS Duration: 84 QT Interval:  418 QTC Calculation: 392 R Axis:   21 Text Interpretation:  Sinus bradycardia Otherwise normal ECG No  significant change since last tracing Confirmed by Maryan Rued  MD, Loree Fee  [phone]) on 08/02/2013 11:08:35 AM      MDM   Final diagnoses:  ACS (acute coronary syndrome)    Patient presenting with symptoms of chest pain concerning for ACS with a history of coronary artery disease status post 2 stents. Patient has recently started anti-hep C medication in the last 5 days and states the pain started after taking his morning dose of medication. However the pain is in the upper left chest causing shortness of breath, diaphoresis and fatigue that did not improve with home nitroglycerin. Currently patient states his  pain as a 5/10 but initially when it started it was a 10 out of 10. It is not worsened with activity. He denies any infectious symptoms or upper respiratory issues.  He has no abdominal pain or concern for GERD at this time. EKG is unchanged with a sinus bradycardia. Her cardiac workup with a CBC, CMP and troponin. Patient was given through 25 of aspirin and morphine as 3 nitroglycerin did not improve his pain at home. Low suspicion for PE at this time.   Will discuss with cardiology once information results. Pain has now been ongoing for approximately 2 hours.  12:24 PM Initial labs wnl.  Pain improved after  morphine.  Will speak with cards about admission.  Blanchie Dessert, MD 08/02/13 1227  Blanchie Dessert, MD 08/02/13 1242

## 2013-08-02 NOTE — ED Notes (Signed)
Pt has finished eating but still making faces due to the chest pain he was having. Nurse just gave him some meds for pain. Pt is seem to be calming down a little.

## 2013-08-02 NOTE — ED Notes (Addendum)
Pt. Developed lt. Side chest pain this am after taking Ribavirin 200 mg .   Non-radiating.  Pt. Reports having sob, dizziness. Skin is warm and dry.  Pt. Is anxious. Resp. E/u, Pt. Took 3 nitro and did not relieve the pain

## 2013-08-03 ENCOUNTER — Inpatient Hospital Stay (HOSPITAL_COMMUNITY): Payer: Medicaid Other

## 2013-08-03 LAB — CBC
HCT: 39.8 % (ref 39.0–52.0)
Hemoglobin: 13.5 g/dL (ref 13.0–17.0)
MCH: 32.6 pg (ref 26.0–34.0)
MCHC: 33.9 g/dL (ref 30.0–36.0)
MCV: 96.1 fL (ref 78.0–100.0)
Platelets: 208 10*3/uL (ref 150–400)
RBC: 4.14 MIL/uL — ABNORMAL LOW (ref 4.22–5.81)
RDW: 13.6 % (ref 11.5–15.5)
WBC: 10.2 10*3/uL (ref 4.0–10.5)

## 2013-08-03 LAB — LIPID PANEL
Cholesterol: 118 mg/dL (ref 0–200)
HDL: 43 mg/dL (ref >39)
LDL Cholesterol: 61 mg/dL (ref 0–99)
TRIGLYCERIDES: 68 mg/dL (ref <150)
Total CHOL/HDL Ratio: 2.7 RATIO
VLDL: 14 mg/dL (ref 0–40)

## 2013-08-03 LAB — BASIC METABOLIC PANEL
BUN: 12 mg/dL (ref 6–23)
CHLORIDE: 104 meq/L (ref 96–112)
CO2: 25 meq/L (ref 19–32)
CREATININE: 1.21 mg/dL (ref 0.50–1.35)
Calcium: 8.8 mg/dL (ref 8.4–10.5)
GFR calc Af Amer: 73 mL/min — ABNORMAL LOW (ref >90)
GFR calc non Af Amer: 63 mL/min — ABNORMAL LOW (ref >90)
Glucose, Bld: 118 mg/dL — ABNORMAL HIGH (ref 70–99)
Potassium: 3.6 mEq/L — ABNORMAL LOW (ref 3.7–5.3)
SODIUM: 138 meq/L (ref 137–147)

## 2013-08-03 LAB — PROTIME-INR
INR: 1.06 (ref 0.00–1.49)
PROTHROMBIN TIME: 13.6 s (ref 11.6–15.2)

## 2013-08-03 LAB — HEPARIN LEVEL (UNFRACTIONATED)
HEPARIN UNFRACTIONATED: 0.44 [IU]/mL (ref 0.30–0.70)
Heparin Unfractionated: 0.54 IU/mL (ref 0.30–0.70)

## 2013-08-03 LAB — TROPONIN I
TROPONIN I: 0.57 ng/mL — AB (ref <0.30)
TROPONIN I: 0.48 ng/mL — AB (ref <0.30)

## 2013-08-03 MED ORDER — TECHNETIUM TC 99M SESTAMIBI - CARDIOLITE
30.0000 | Freq: Once | INTRAVENOUS | Status: AC | PRN
  Administered 2013-08-03: 30 via INTRAVENOUS

## 2013-08-03 MED ORDER — POTASSIUM CHLORIDE CRYS ER 20 MEQ PO TBCR
20.0000 meq | EXTENDED_RELEASE_TABLET | Freq: Three times a day (TID) | ORAL | Status: AC
  Administered 2013-08-03 (×3): 20 meq via ORAL
  Filled 2013-08-03 (×3): qty 1

## 2013-08-03 MED ORDER — REGADENOSON 0.4 MG/5ML IV SOLN
INTRAVENOUS | Status: AC
  Administered 2013-08-03: 0.4 mg via INTRAVENOUS
  Filled 2013-08-03: qty 5

## 2013-08-03 MED ORDER — TECHNETIUM TC 99M SESTAMIBI - CARDIOLITE
10.0000 | Freq: Once | INTRAVENOUS | Status: AC | PRN
Start: 2013-08-03 — End: 2013-08-03
  Administered 2013-08-03: 09:00:00 10 via INTRAVENOUS

## 2013-08-03 MED ORDER — REGADENOSON 0.4 MG/5ML IV SOLN
0.4000 mg | Freq: Once | INTRAVENOUS | Status: AC
  Administered 2013-08-03: 0.4 mg via INTRAVENOUS
  Filled 2013-08-03: qty 5

## 2013-08-03 NOTE — Progress Notes (Signed)
CRITICAL VALUE ALERT  Critical value received: Troponin 0.57  Date of notification:  08/03/13  Time of notification:  0030  Critical value read back:yes  Nurse who received alert:  Duanne Moron RN   MD notified (1st page):  Jules Husbands MD  Time of first page:  [phone]  Responding MD:  Jules Husbands MD  Time MD responded: [phone]

## 2013-08-03 NOTE — Progress Notes (Signed)
ANTICOAGULATION CONSULT NOTE - Initial Consult  Pharmacy Consult for heparin Indication: chest pain/ACS  Allergies  Allergen Reactions  . Sulfonamide Derivatives Rash and Other (See Comments)    REACTION: penile irritation    Patient Measurements: Height: 5\' 11"  (180.3 cm) Weight: 202 lb 6.1 oz (91.8 kg) IBW/kg (Calculated) : 75.3 Heparin Dosing Weight: 94 kg  Vital Signs: Temp: 97.8 F (36.6 C) (05/23 0414) Temp src: Oral (05/23 0414) BP: 141/88 mmHg (05/23 0951) Pulse Rate: 54 (05/23 0806)  Labs:  Recent Labs  08/02/13 1115 08/02/13 2320 08/02/13 2327 08/03/13 0251  HGB 14.3  --   --  13.5  HCT 42.7  --   --  39.8  PLT 216  --   --  208  LABPROT  --   --   --  13.6  INR  --   --   --  1.06  HEPARINUNFRC  --   --  0.54 0.44  CREATININE 1.15  --   --  1.21  TROPONINI  --  0.57*  --  0.48*    Estimated Creatinine Clearance: 74.3 ml/min (by C-G formula based on Cr of 1.21).   Medical History: Past Medical History  Diagnosis Date  . Hypertension   . Shortness of breath   . Hepatitis     c  . Osteoarthritis of left knee 06/21/2011  . Coronary artery disease   . Anginal pain   . Cancer     colon    Medications:  Prescriptions prior to admission  Medication Sig Dispense Refill  . metoprolol succinate (TOPROL-XL) 25 MG 24 hr tablet Take 25 mg by mouth daily.      . nitroGLYCERIN (NITROSTAT) 0.4 MG SL tablet Place 0.4 mg under the tongue every 5 (five) minutes as needed for chest pain.      . Ombitas-Paritapre-Ritona-Dasab (VIEKIRA PAK) 12.5-75-50 &250 MG TBPK Take 1 packet by mouth daily.      . ribavirin (REBETOL) 200 MG capsule Take 600 mg by mouth 2 (two) times daily.        Assessment: 62 yo M admitted to [facility] for ACS with CP and +troponins. IV heparin infusion started. HL remains therapeutic at 0.44 at 1300 units/hr. H/H, plt wnl and stable with no reported s/s bleeding.  Goal of Therapy:  Heparin level 0.3-0.7 units/ml Monitor  platelets by anticoagulation protocol: Yes   Plan:  - Cont heparin at 1300 units/hr - Monitor daily HL, CBC, s/s bleeding - F/u plans for cath vs stress test  Harolyn Rutherford, PharmD Clinical Pharmacist - Resident Pager: [phone] Pharmacy: [phone] 08/03/2013 10:51 AM

## 2013-08-03 NOTE — Progress Notes (Signed)
1:28 AM   Heparin per RX     Heparin level 0.54 with goal 0.3-0.7 for ACS. No bleeding noted. Will continue at 1300 units/hr and f/u daily labs and cbc  Curlene Dolphin

## 2013-08-03 NOTE — Progress Notes (Signed)
PT IV out with noteable dried blood and fluid on sheet. Stopped heparin and nitro drips and placed new PIV. Sent pt to stress test with no fluid running. Will restart heparin upon his return to unit.

## 2013-08-04 LAB — CBC
HEMATOCRIT: 41.6 % (ref 39.0–52.0)
Hemoglobin: 14.1 g/dL (ref 13.0–17.0)
MCH: 32.8 pg (ref 26.0–34.0)
MCHC: 33.9 g/dL (ref 30.0–36.0)
MCV: 96.7 fL (ref 78.0–100.0)
Platelets: 235 10*3/uL (ref 150–400)
RBC: 4.3 MIL/uL (ref 4.22–5.81)
RDW: 13.8 % (ref 11.5–15.5)
WBC: 10.5 10*3/uL (ref 4.0–10.5)

## 2013-08-04 LAB — HEPARIN LEVEL (UNFRACTIONATED): Heparin Unfractionated: 0.35 IU/mL (ref 0.30–0.70)

## 2013-08-04 MED ORDER — ISOSORBIDE MONONITRATE ER 30 MG PO TB24: 30.0000 mg | ORAL_TABLET | Freq: Every day | ORAL | Status: DC

## 2013-08-04 MED ORDER — ASPIRIN 81 MG PO TBEC: 81.0000 mg | DELAYED_RELEASE_TABLET | Freq: Every day | ORAL | Status: AC

## 2013-08-04 MED ORDER — AMLODIPINE BESYLATE 5 MG PO TABS: 5.0000 mg | ORAL_TABLET | Freq: Every day | ORAL | Status: DC

## 2013-08-04 NOTE — Discharge Summary (Signed)
Physician Discharge Summary  Patient ID: [name] [name] MRN: [id number] DOB/AGE: 61-Jan-1952 61 y.o.  Admit date: 08/02/2013 Discharge date: 08/04/2013  Admission Diagnoses: Acute coronary syndrome  R/O MI  CAD  S/P LAD stent  Hypertension  S/P Colon cancer  Hepatitis C with active treatment (Off antilipemic agent)  Discharge Diagnoses:  Principle Problem: * Acute coronary syndrome * R/O MI  CAD  S/P LAD stent  Hypertension  S/P Colon cancer  Hepatitis C with active treatment (Off antilipemic agent)   Discharged Condition: fair  Hospital Course: 63 year old male admitted with left precordial sharp, non-radiating chest pain improving with NTG and morphine use. He had diaphoresis and shortness of breath. Risk factors included coronary artery disease, high cholesterol, hypertension, male sex and smoking. He has History of mid-LAD 3.0 x 18 Xience Xpedition stent on 11/24/2011. He underwent nuclear stress test that was read as no ischemia but subtle anterior apical ischemia was seen. Aspirin, Amlodipine and Imdur were added to Toprol and he was discharged home in stable condition with follow up by Dr. Terrence [name] in 2 days. He will remain off cholesterol lowering medications while treated for hepatitis C.  Consults: cardiology  Significant Diagnostic Studies: labs: Normal CBC and near normal CMET. Minimally elevated troponin -I.  Chest X-Ray-No acute abnormality.  Nuclear stress test: 1. Negative for pharmacologic-stress induced ischemia. 2. Left ventricular ejection fraction 51%.  EKG-Sinus bradycardia. Lateral ischemia.  Treatments: cardiac meds: metoprolol, amlodipine, isosorbide mononitrate and aspirin.  Discharge Exam: Blood pressure 149/83, pulse 116, temperature 98.1 F (36.7 C), temperature source Oral, resp. rate 15, height 5\' 11"  (1.803 m), weight 94.6 kg (208 lb 8.9 oz), SpO2 95.00%. HEENT: [place]/AT, Eyes-Brown, PERL, EOMI, Conjunctiva-Pink, Sclera-Non-icteric  Neck: No  JVD, No bruit, Trachea midline.  Lungs: Clear, Bilateral.  Cardiac: Regular rhythm, normal S1 and S2, no S3.  Abdomen: Soft, non-tender.  Extremities: No edema present. No cyanosis. No clubbing.  CNS: AxOx3, Cranial nerves grossly intact, moves all 4 extremities. Right handed.  Skin: Warm and dry.   Disposition: 01-Home or Self Care     Medication List         amLODipine 5 MG tablet  Commonly known as:  NORVASC  Take 1 tablet (5 mg total) by mouth daily.     aspirin 81 MG EC tablet  Take 1 tablet (81 mg total) by mouth daily.     isosorbide mononitrate 30 MG 24 hr tablet  Commonly known as:  IMDUR  Take 1 tablet (30 mg total) by mouth daily.     metoprolol succinate 25 MG 24 hr tablet  Commonly known as:  TOPROL-XL  Take 25 mg by mouth daily.     nitroGLYCERIN 0.4 MG SL tablet  Commonly known as:  NITROSTAT  Place 0.4 mg under the tongue every 5 (five) minutes as needed for chest pain.     ribavirin 200 MG capsule  Commonly known as:  REBETOL  Take 600 mg by mouth 2 (two) times daily.     VIEKIRA PAK 12.5-75-50 &250 MG Tbpk  Generic drug:  [place]-Paritapre-Ritona-Dasab  Take 1 packet by mouth daily.           Follow-up Information   Follow up with [name] Demark, MD In 2 days.   Specialty:  Cardiology   Contact information:   [place] [address] [place] [address] [phone]       Signed: Birdie Riddle 08/04/2013, 10:05 AM

## 2013-08-04 NOTE — Progress Notes (Signed)
Late entry Ref: Clent Demark, MD   Subjective:  Feeling little better with NTG and IV heparin.  Objective:  Vital Signs in the last 24 hours: Temp:  [97.5 F (36.4 C)-98.4 F (36.9 C)] 98.1 F (36.7 C) (05/24 0321) Pulse Rate:  [53-116] 116 (05/24 0834) Cardiac Rhythm:  [-] Sinus bradycardia (05/23 2000) Resp:  [15-24] 15 (05/24 0834) BP: (138-153)/(78-99) 149/83 mmHg (05/24 0834) SpO2:  [95 %-99 %] 95 % (05/24 0834) Weight:  [94.6 kg (208 lb 8.9 oz)] 94.6 kg (208 lb 8.9 oz) (05/24 0323)  Physical Exam: BP Readings from Last 1 Encounters:  08/04/13 149/83    Wt Readings from Last 1 Encounters:  08/04/13 94.6 kg (208 lb 8.9 oz)    Weight change: -0.202 kg (-7.1 oz)  HEENT: [place]/AT, Eyes-Brown, PERL, EOMI, Conjunctiva-Pink, Sclera-Non-icteric Neck: No JVD, No bruit, Trachea midline. Lungs:  Clear, Bilateral. Cardiac:  Regular rhythm, normal S1 and S2, no S3.  Abdomen:  Soft, non-tender. Extremities:  No edema present. No cyanosis. No clubbing. CNS: AxOx3, Cranial nerves grossly intact, moves all 4 extremities. Right handed. Skin: Warm and dry.   Intake/Output from previous day: 05/23 0701 - 05/24 0700 In: 1294 [P.O.:1060; I.V.:234] Out: 2450 [Urine:2450]    Lab Results: BMET    Component Value Date/Time   NA 138 08/03/2013 0251   NA 137 08/02/2013 1115   NA 141 12/19/2012 1107   NA 141 11/25/2011 0530   K 3.6* 08/03/2013 0251   K 3.9 08/02/2013 1115   K 4.0 12/19/2012 1107   K 4.0 11/25/2011 0530   CL 104 08/03/2013 0251   CL 103 08/02/2013 1115   CL 109 11/25/2011 0530   CO2 25 08/03/2013 0251   CO2 21 08/02/2013 1115   CO2 25 12/19/2012 1107   CO2 24 11/25/2011 0530   GLUCOSE 118* 08/03/2013 0251   GLUCOSE 103* 08/02/2013 1115   GLUCOSE 104 12/19/2012 1107   GLUCOSE 89 11/25/2011 0530   BUN 12 08/03/2013 0251   BUN 15 08/02/2013 1115   BUN 9.6 12/19/2012 1107   BUN 10 11/25/2011 0530   CREATININE 1.21 08/03/2013 0251   CREATININE 1.15 08/02/2013 1115   CREATININE 1.3  12/19/2012 1107   CREATININE 1.18 11/25/2011 0530   CALCIUM 8.8 08/03/2013 0251   CALCIUM 9.2 08/02/2013 1115   CALCIUM 9.3 12/19/2012 1107   CALCIUM 9.4 11/25/2011 0530   GFRNONAA 63* 08/03/2013 0251   GFRNONAA 67* 08/02/2013 1115   GFRNONAA 66* 11/25/2011 0530   GFRAA 73* 08/03/2013 0251   GFRAA 78* 08/02/2013 1115   GFRAA 76* 11/25/2011 0530   CBC    Component Value Date/Time   WBC 10.5 08/04/2013 0317   WBC 5.4 12/19/2012 1107   RBC 4.30 08/04/2013 0317   RBC 4.62 12/19/2012 1107   HGB 14.1 08/04/2013 0317   HGB 14.8 12/19/2012 1107   HCT 41.6 08/04/2013 0317   HCT 44.2 12/19/2012 1107   PLT 235 08/04/2013 0317   PLT 215 12/19/2012 1107   MCV 96.7 08/04/2013 0317   MCV 95.5 12/19/2012 1107   MCH 32.8 08/04/2013 0317   MCH 32.1 12/19/2012 1107   MCHC 33.9 08/04/2013 0317   MCHC 33.6 12/19/2012 1107   RDW 13.8 08/04/2013 0317   RDW 13.7 12/19/2012 1107   LYMPHSABS 1.7 12/19/2012 1107   LYMPHSABS 2.6 01/24/2011 0500   MONOABS 0.4 12/19/2012 1107   MONOABS 1.3* 01/24/2011 0500   EOSABS 0.2 12/19/2012 1107   EOSABS 0.4 01/24/2011 0500  BASOSABS 0.1 12/19/2012 1107   BASOSABS 0.1 01/24/2011 0500   HEPATIC Function Panel  Recent Labs  12/19/12 1107 08/02/13 1115  PROT 7.3 7.2   HEMOGLOBIN A1C No components found with this basename: HGA1C,  MPG   CARDIAC ENZYMES Lab Results  Component Value Date   TROPONINI 0.48* 08/03/2013   TROPONINI 0.57* 08/02/2013   BNP No results found for this basename: PROBNP,  in the last 8760 hours TSH No results found for this basename: TSH,  in the last 8760 hours CHOLESTEROL  Recent Labs  08/03/13 0251  CHOL 118    Scheduled Meds: . amLODipine  5 mg Oral Daily  . aspirin EC  81 mg Oral Daily  . metoprolol  5 mg Intravenous Once  . metoprolol succinate  25 mg Oral Daily  . Ombitas-Paritapre-Ritona-Dasab  1 packet Oral BID WC  . ribavirin  600 mg Oral BID  . sodium chloride  3 mL Intravenous Q12H   Continuous Infusions: . heparin 1,300 Units/hr  (08/03/13 1553)  . nitroGLYCERIN Stopped (08/03/13 0800)   PRN Meds:.sodium chloride, acetaminophen, morphine injection, nitroGLYCERIN, ondansetron (ZOFRAN) IV, sodium chloride  Assessment/Plan: Acute coronary syndrome  R/O MI  CAD  S/P LAD stent  Hypertension  S/P Colon cancer  Hepatitis C with active treatment (Off antilipemic agent)  Nuclear stress test with subtle anterior apical ischemia. Patient wants to try medical therapy for now.    LOS: 1 days    Dixie Dials  MD  08/04/2013, 9:57 AM

## 2013-11-04 ENCOUNTER — Telehealth: Payer: Self-pay | Admitting: Hematology

## 2013-11-04 NOTE — Telephone Encounter (Signed)
pt cld to get appt time & date-gave pt appt time & date-pt understood °

## 2013-12-19 ENCOUNTER — Other Ambulatory Visit: Payer: Medicaid Other

## 2013-12-19 ENCOUNTER — Ambulatory Visit: Payer: Medicaid Other

## 2014-02-20 ENCOUNTER — Encounter (HOSPITAL_COMMUNITY): Payer: Self-pay | Admitting: Cardiology

## 2014-05-05 ENCOUNTER — Other Ambulatory Visit: Payer: Self-pay | Admitting: Nurse Practitioner

## 2014-05-05 DIAGNOSIS — C220 Liver cell carcinoma: Secondary | ICD-10-CM

## 2014-05-13 ENCOUNTER — Ambulatory Visit
Admission: RE | Admit: 2014-05-13 | Discharge: 2014-05-13 | Disposition: A | Discharge status: Home / Self Care | Payer: Medicaid Other | Source: Ambulatory Visit | Attending: Nurse Practitioner | Admitting: Nurse Practitioner

## 2014-05-13 DIAGNOSIS — C220 Liver cell carcinoma: Secondary | ICD-10-CM

## 2014-10-08 ENCOUNTER — Telehealth: Payer: Self-pay | Admitting: Hematology

## 2014-10-08 NOTE — Telephone Encounter (Signed)
Pt came in to get new appt....gv and printed

## 2014-10-29 ENCOUNTER — Other Ambulatory Visit: Payer: Self-pay | Admitting: *Deleted

## 2014-10-29 DIAGNOSIS — B1710 Acute hepatitis C without hepatic coma: Secondary | ICD-10-CM

## 2014-10-29 DIAGNOSIS — C189 Malignant neoplasm of colon, unspecified: Secondary | ICD-10-CM

## 2014-10-30 ENCOUNTER — Encounter: Payer: Medicaid Other | Admitting: Hematology

## 2014-10-30 ENCOUNTER — Encounter: Payer: Self-pay | Admitting: Hematology

## 2014-10-30 ENCOUNTER — Other Ambulatory Visit: Payer: Medicaid Other

## 2014-10-30 DIAGNOSIS — Z85038 Personal history of other malignant neoplasm of large intestine: Secondary | ICD-10-CM | POA: Insufficient documentation

## 2014-10-30 NOTE — Progress Notes (Signed)
No show  This encounter was created in error - please disregard.

## 2014-10-31 ENCOUNTER — Telehealth: Payer: Self-pay | Admitting: Hematology

## 2014-10-31 NOTE — Telephone Encounter (Signed)
per pof to call pt & r/s missed appt-* cld & spoke to pt and gave r/s appt-pt understood

## 2014-11-18 ENCOUNTER — Telehealth: Payer: Self-pay | Admitting: Hematology

## 2014-11-18 ENCOUNTER — Encounter: Payer: Self-pay | Admitting: Hematology

## 2014-11-18 ENCOUNTER — Other Ambulatory Visit: Payer: Medicaid Other

## 2014-11-18 ENCOUNTER — Ambulatory Visit: Payer: Medicaid Other | Admitting: Hematology

## 2014-11-18 NOTE — Telephone Encounter (Signed)
Returned Advertising account executive. Left voicemail to call and reschedule appointment canceled for today.

## 2015-04-02 ENCOUNTER — Encounter (HOSPITAL_COMMUNITY): Payer: Self-pay | Admitting: *Deleted

## 2015-04-02 ENCOUNTER — Emergency Department (HOSPITAL_COMMUNITY)
Admission: EM | Admit: 2015-04-02 | Discharge: 2015-04-03 | Disposition: A | Discharge status: Home / Self Care | Payer: Medicaid Other | Attending: Emergency Medicine | Admitting: Emergency Medicine

## 2015-04-02 DIAGNOSIS — F17210 Nicotine dependence, cigarettes, uncomplicated: Secondary | ICD-10-CM | POA: Diagnosis not present

## 2015-04-02 DIAGNOSIS — M542 Cervicalgia: Secondary | ICD-10-CM | POA: Diagnosis present

## 2015-04-02 DIAGNOSIS — Z79899 Other long term (current) drug therapy: Secondary | ICD-10-CM | POA: Diagnosis not present

## 2015-04-02 DIAGNOSIS — Z9861 Coronary angioplasty status: Secondary | ICD-10-CM | POA: Diagnosis not present

## 2015-04-02 DIAGNOSIS — I10 Essential (primary) hypertension: Secondary | ICD-10-CM | POA: Insufficient documentation

## 2015-04-02 DIAGNOSIS — Z7982 Long term (current) use of aspirin: Secondary | ICD-10-CM | POA: Diagnosis not present

## 2015-04-02 DIAGNOSIS — Z8719 Personal history of other diseases of the digestive system: Secondary | ICD-10-CM | POA: Diagnosis not present

## 2015-04-02 DIAGNOSIS — Z85038 Personal history of other malignant neoplasm of large intestine: Secondary | ICD-10-CM | POA: Insufficient documentation

## 2015-04-02 DIAGNOSIS — M1712 Unilateral primary osteoarthritis, left knee: Secondary | ICD-10-CM | POA: Insufficient documentation

## 2015-04-02 DIAGNOSIS — I25119 Atherosclerotic heart disease of native coronary artery with unspecified angina pectoris: Secondary | ICD-10-CM | POA: Diagnosis not present

## 2015-04-02 DIAGNOSIS — L720 Epidermal cyst: Secondary | ICD-10-CM | POA: Diagnosis not present

## 2015-04-02 DIAGNOSIS — Z9889 Other specified postprocedural states: Secondary | ICD-10-CM | POA: Diagnosis not present

## 2015-04-02 MED ORDER — IBUPROFEN 200 MG PO TABS
600.0000 mg | ORAL_TABLET | Freq: Once | ORAL | Status: AC
  Administered 2015-04-02: 600 mg via ORAL
  Filled 2015-04-02: qty 1

## 2015-04-02 MED ORDER — IBUPROFEN 600 MG PO TABS: 600.0000 mg | ORAL_TABLET | Freq: Three times a day (TID) | ORAL | Status: DC | PRN

## 2015-04-02 MED ORDER — DIAZEPAM 5 MG PO TABS: 5.0000 mg | ORAL_TABLET | Freq: Three times a day (TID) | ORAL | Status: DC | PRN

## 2015-04-02 MED ORDER — DIAZEPAM 5 MG PO TABS
5.0000 mg | ORAL_TABLET | Freq: Once | ORAL | Status: AC
  Administered 2015-04-02: 5 mg via ORAL
  Filled 2015-04-02: qty 1

## 2015-04-02 MED ORDER — OXYCODONE-ACETAMINOPHEN 5-325 MG PO TABS: 1.0000 | ORAL_TABLET | ORAL | Status: DC | PRN

## 2015-04-02 MED ORDER — OXYCODONE-ACETAMINOPHEN 5-325 MG PO TABS
1.0000 | ORAL_TABLET | Freq: Once | ORAL | Status: AC
  Administered 2015-04-02: 1 via ORAL
  Filled 2015-04-02: qty 1

## 2015-04-02 MED ORDER — LIDOCAINE-EPINEPHRINE (PF) 2 %-1:200000 IJ SOLN
20.0000 mL | Freq: Once | INTRAMUSCULAR | Status: AC
  Administered 2015-04-03: 20 mL

## 2015-04-02 NOTE — ED Notes (Signed)
Pt states he had surgery in 2002 for colon cancer and acquired a blood clot in his neck. States that he started experiencing left neck pain last night. Pt is unsure if he slept wrong or if he may be getting another blood clot. Does not take blood thinners.

## 2015-04-02 NOTE — ED Provider Notes (Signed)
CSN: [id number]     Arrival date & time 04/02/15  1707 History   First MD Initiated Contact with Patient 04/02/15 2233     Chief Complaint  Patient presents with  . Neck Pain      HPI Patient presents to the emergency department complaining of 24 hours of left-sided neck pain.  He states he awoke with severe pain in his left neck with radiation down into his left trapezius muscle.  He states his pain is worse with turning of his head and looking in an upward fashion.  No injury or trauma.  He is not sure if he slept wrong.  He's concerned about his neck however because he has a prior history of what sounds like venous clot in his left neck.  He does not know the complete details.  He was on anticoagulation.  This was during a time of active colon cancer.  He is currently cancer free as far as he knows.  He denies swelling of his neck.  No recent injury or trauma to his left neck.  He denies weakness of his arms or legs.  No difficulty breathing or swallowing.  No other complaints.     Past Medical History  Diagnosis Date  . Hypertension   . Shortness of breath   . Hepatitis     c  . Osteoarthritis of left knee 06/21/2011  . Coronary artery disease   . Anginal pain ([place])   . Cancer [facility])     colon   Past Surgical History  Procedure Laterality Date  . Cardiac catheterization      2005   dr Romilda Garret  . Joint replacement      rt knee  . Colon surgery      2002   colon  ca   dr Bubba Camp      chemo  tx  . Replacement total knee      bilateral knee replacements  . Coronary angioplasty    . Left heart catheterization with coronary angiogram N/A 11/24/2011    Procedure: LEFT HEART CATHETERIZATION WITH CORONARY ANGIOGRAM;  Surgeon: Clent Demark, MD;  Location: [facility] CATH LAB;  Service: Cardiovascular;  Laterality: N/A;  . Percutaneous coronary stent intervention (pci-s)  11/24/2011    Procedure: PERCUTANEOUS CORONARY STENT INTERVENTION (PCI-S);  Surgeon: Clent Demark, MD;  Location: [facility] CATH  LAB;  Service: Cardiovascular;;   Family History  Problem Relation Age of Onset  . Hypertension Mother   . Colon cancer Neg Hx    Social History  Substance Use Topics  . Smoking status: Current Every Day Smoker -- 0.25 packs/day for 7 years    Types: Cigarettes    Last Attempt to Quit: 11/23/2011  . Smokeless tobacco: Never Used  . Alcohol Use: 2.4 oz/week    4 Cans of beer per week     Comment: oct   2012    Review of Systems  All other systems reviewed and are negative.     Allergies  Sulfonamide derivatives  Home Medications   Prior to Admission medications   Medication Sig Start Date End Date Taking? Authorizing Provider  amLODipine (NORVASC) 5 MG tablet Take 1 tablet (5 mg total) by mouth daily. 08/04/13   Dixie Dials, MD  aspirin EC 81 MG EC tablet Take 1 tablet (81 mg total) by mouth daily. 08/04/13   Dixie Dials, MD  diazepam (VALIUM) 5 MG tablet Take 1 tablet (5 mg total) by mouth every 8 (eight) hours as  needed (dizziness). 04/02/15   Jola Schmidt, MD  ibuprofen (ADVIL,MOTRIN) 600 MG tablet Take 1 tablet (600 mg total) by mouth every 8 (eight) hours as needed. 04/02/15   Jola Schmidt, MD  isosorbide mononitrate (IMDUR) 30 MG 24 hr tablet Take 1 tablet (30 mg total) by mouth daily. 08/04/13   Dixie Dials, MD  metoprolol succinate (TOPROL-XL) 25 MG 24 hr tablet Take 25 mg by mouth daily.    Historical Provider, MD  nitroGLYCERIN (NITROSTAT) 0.4 MG SL tablet Place 0.4 mg under the tongue every 5 (five) minutes as needed for chest pain.    Historical Provider, MD  Ombitas-Paritapre-Ritona-Dasab (VIEKIRA PAK) 12.5-75-50 &250 MG TBPK Take 1 packet by mouth daily.    Historical Provider, MD  oxyCODONE-acetaminophen (PERCOCET/ROXICET) 5-325 MG tablet Take 1 tablet by mouth every 4 (four) hours as needed for severe pain. 04/02/15   Jola Schmidt, MD  ribavirin (REBETOL) 200 MG capsule Take 600 mg by mouth 2 (two) times daily.    Historical Provider, MD   BP 156/100 mmHg  Pulse  76  Temp(Src) 97.7 F (36.5 C) (Oral)  Resp 23  SpO2 95% Physical Exam  Constitutional: He is oriented to person, place, and time. He appears well-developed and well-nourished.  HENT:  Head: Normocephalic and atraumatic.  Eyes: EOM are normal.  Neck: Normal range of motion.  Cardiovascular: Normal rate, regular rhythm, normal heart sounds and intact distal pulses.   Pulmonary/Chest: Effort normal and breath sounds normal. No respiratory distress.  Abdominal: Soft. He exhibits no distension. There is no tenderness.  Musculoskeletal: Normal range of motion.  Epidermoid cyst of his right mid back without surrounding erythema.  Neurological: He is alert and oriented to person, place, and time.  Skin: Skin is warm and dry.  Psychiatric: He has a normal mood and affect. Judgment normal.  Nursing note and vitals reviewed.   ED Course  Procedures (including critical care time)  INCISION AND DRAINAGE Performed by: Hoy Morn Consent: Verbal consent obtained. Risks and benefits: risks, benefits and alternatives were discussed Time out performed prior to procedure Type: epidermoid cyst Body area: Right mid back Anesthesia: local infiltration Incision was made with a scalpel. Local anesthetic: lidocaine 2% with epinephrine Anesthetic total:  ml Complexity: complex Blunt dissection to break up loculations Drainage: purulent Drainage amount: Moderate  Packing material: None  Patient tolerance: Patient tolerated the procedure well with no immediate complications.     Labs Review Labs Reviewed - No data to display  Imaging Review No results found. I have personally reviewed and evaluated these images and lab results as part of my medical decision-making.   EKG Interpretation None      MDM   Final diagnoses:  Neck pain  Epidermoid cyst    Patient's neck discomfort seems to be more musculoskeletal in nature.  Doubt DVT.  Crit bedside ultrasound demonstrates  completely patent left IJ.  Normal arterial flow.  This was a quick and rudimentary evaluation the bedside.  He also came up with a secondary complaint and that is large epidermoid cyst on his right mid back that he requested incision and drainage given the discomfort and pain.  He states the last time it was incised was 7 years ago    Jola Schmidt, MD 04/03/15 [phone]

## 2015-04-03 NOTE — ED Notes (Signed)
Pt left at this time with all belongings.  

## 2015-07-31 ENCOUNTER — Other Ambulatory Visit: Payer: Self-pay | Admitting: Cardiology

## 2015-07-31 DIAGNOSIS — R079 Chest pain, unspecified: Secondary | ICD-10-CM

## 2015-08-05 ENCOUNTER — Encounter (HOSPITAL_COMMUNITY): Admission: RE | Admit: 2015-08-05 | Payer: Medicaid Other | Source: Ambulatory Visit

## 2015-08-05 ENCOUNTER — Encounter (HOSPITAL_COMMUNITY): Payer: Medicaid Other

## 2015-08-12 ENCOUNTER — Encounter (HOSPITAL_COMMUNITY)
Admission: RE | Admit: 2015-08-12 | Discharge: 2015-08-12 | Disposition: A | Discharge status: Home / Self Care | Payer: Medicaid Other | Source: Ambulatory Visit | Attending: Cardiology | Admitting: Cardiology

## 2015-08-12 DIAGNOSIS — Z955 Presence of coronary angioplasty implant and graft: Secondary | ICD-10-CM | POA: Diagnosis not present

## 2015-08-12 DIAGNOSIS — R079 Chest pain, unspecified: Secondary | ICD-10-CM

## 2015-08-12 DIAGNOSIS — I10 Essential (primary) hypertension: Secondary | ICD-10-CM | POA: Diagnosis not present

## 2015-08-12 DIAGNOSIS — I2510 Atherosclerotic heart disease of native coronary artery without angina pectoris: Secondary | ICD-10-CM | POA: Insufficient documentation

## 2015-08-12 MED ORDER — TECHNETIUM TC 99M TETROFOSMIN IV KIT
10.0000 | PACK | Freq: Once | INTRAVENOUS | Status: AC | PRN
  Administered 2015-08-12: 10 via INTRAVENOUS

## 2015-08-12 MED ORDER — REGADENOSON 0.4 MG/5ML IV SOLN
INTRAVENOUS | Status: AC
  Administered 2015-08-12: 0.4 mg via INTRAVENOUS
  Filled 2015-08-12: qty 5

## 2015-08-12 MED ORDER — TECHNETIUM TC 99M TETROFOSMIN IV KIT
30.0000 | PACK | Freq: Once | INTRAVENOUS | Status: AC | PRN
  Administered 2015-08-12: 30 via INTRAVENOUS

## 2015-08-12 MED ORDER — REGADENOSON 0.4 MG/5ML IV SOLN
0.4000 mg | Freq: Once | INTRAVENOUS | Status: AC
  Administered 2015-08-12: 0.4 mg via INTRAVENOUS

## 2015-08-12 NOTE — Progress Notes (Signed)
Pre test VS

## 2015-08-12 NOTE — Progress Notes (Signed)
3 min, Dr. Terrence Dupont at bedside, pt reports symptoms are "going away." Pt denies any cough at this time

## 2015-08-12 NOTE — Progress Notes (Signed)
5 min, dr. Terrence Dupont at bedside, pt states all symptoms have passed. Test ended

## 2015-08-12 NOTE — Progress Notes (Signed)
1 min, Dr. Terrence Dupont at bedside, pt reports "fast heart"  SOB, and a cough.

## 2015-11-27 LAB — GLUCOSE, POCT (MANUAL RESULT ENTRY): POC GLUCOSE: 88 mg/dL (ref 70–99)

## 2015-12-31 ENCOUNTER — Encounter (HOSPITAL_COMMUNITY)
Admission: RE | Disposition: A | Discharge status: Home / Self Care | Payer: Self-pay | Source: Ambulatory Visit | Attending: Cardiology

## 2015-12-31 ENCOUNTER — Ambulatory Visit (HOSPITAL_COMMUNITY)
Admission: RE | Admit: 2015-12-31 | Discharge: 2016-01-01 | Disposition: A | Discharge status: Home / Self Care | Payer: Medicaid Other | Source: Ambulatory Visit | Attending: Cardiology | Admitting: Cardiology

## 2015-12-31 ENCOUNTER — Encounter (HOSPITAL_COMMUNITY): Payer: Self-pay | Admitting: General Practice

## 2015-12-31 DIAGNOSIS — I25110 Atherosclerotic heart disease of native coronary artery with unstable angina pectoris: Secondary | ICD-10-CM | POA: Insufficient documentation

## 2015-12-31 DIAGNOSIS — E785 Hyperlipidemia, unspecified: Secondary | ICD-10-CM | POA: Insufficient documentation

## 2015-12-31 DIAGNOSIS — I200 Unstable angina: Secondary | ICD-10-CM | POA: Diagnosis present

## 2015-12-31 DIAGNOSIS — M1990 Unspecified osteoarthritis, unspecified site: Secondary | ICD-10-CM | POA: Insufficient documentation

## 2015-12-31 DIAGNOSIS — Z9861 Coronary angioplasty status: Secondary | ICD-10-CM

## 2015-12-31 DIAGNOSIS — I10 Essential (primary) hypertension: Secondary | ICD-10-CM | POA: Insufficient documentation

## 2015-12-31 DIAGNOSIS — Z7982 Long term (current) use of aspirin: Secondary | ICD-10-CM | POA: Insufficient documentation

## 2015-12-31 DIAGNOSIS — Z79899 Other long term (current) drug therapy: Secondary | ICD-10-CM | POA: Insufficient documentation

## 2015-12-31 HISTORY — PX: CARDIAC CATHETERIZATION: SHX172

## 2015-12-31 LAB — POCT ACTIVATED CLOTTING TIME: Activated Clotting Time: 318 seconds

## 2015-12-31 SURGERY — LEFT HEART CATH AND CORONARY ANGIOGRAPHY

## 2015-12-31 MED ORDER — TICAGRELOR 90 MG PO TABS
90.0000 mg | ORAL_TABLET | Freq: Two times a day (BID) | ORAL | Status: DC
  Administered 2015-12-31 – 2016-01-01 (×2): 90 mg via ORAL
  Filled 2015-12-31 (×2): qty 1

## 2015-12-31 MED ORDER — ASPIRIN 81 MG PO CHEW
CHEWABLE_TABLET | ORAL | Status: AC
  Filled 2015-12-31: qty 1

## 2015-12-31 MED ORDER — PRASUGREL HCL 10 MG PO TABS
ORAL_TABLET | ORAL | Status: DC | PRN
  Administered 2015-12-31: 60 mg via ORAL

## 2015-12-31 MED ORDER — SODIUM CHLORIDE 0.9% FLUSH: 3.0000 mL | Freq: Two times a day (BID) | INTRAVENOUS | Status: DC

## 2015-12-31 MED ORDER — ACETAMINOPHEN 325 MG PO TABS: 650.0000 mg | ORAL_TABLET | ORAL | Status: DC | PRN

## 2015-12-31 MED ORDER — ONDANSETRON HCL 4 MG/2ML IJ SOLN: 4.0000 mg | Freq: Four times a day (QID) | INTRAMUSCULAR | Status: DC | PRN

## 2015-12-31 MED ORDER — MIDAZOLAM HCL 2 MG/2ML IJ SOLN
INTRAMUSCULAR | Status: AC
  Filled 2015-12-31: qty 2

## 2015-12-31 MED ORDER — HEPARIN (PORCINE) IN NACL 2-0.9 UNIT/ML-% IJ SOLN
INTRAMUSCULAR | Status: AC
  Filled 2015-12-31: qty 1000

## 2015-12-31 MED ORDER — BIVALIRUDIN 250 MG IV SOLR
INTRAVENOUS | Status: AC
  Filled 2015-12-31: qty 250

## 2015-12-31 MED ORDER — NITROGLYCERIN 1 MG/10 ML FOR IR/CATH LAB
INTRA_ARTERIAL | Status: AC
  Filled 2015-12-31: qty 10

## 2015-12-31 MED ORDER — LIDOCAINE HCL (PF) 1 % IJ SOLN
INTRAMUSCULAR | Status: DC | PRN
  Administered 2015-12-31: 15 mL

## 2015-12-31 MED ORDER — FENTANYL CITRATE (PF) 100 MCG/2ML IJ SOLN
INTRAMUSCULAR | Status: AC
  Filled 2015-12-31: qty 2

## 2015-12-31 MED ORDER — NITROGLYCERIN 1 MG/10 ML FOR IR/CATH LAB
INTRA_ARTERIAL | Status: DC | PRN
  Administered 2015-12-31 (×2): 150 ug via INTRACORONARY

## 2015-12-31 MED ORDER — PRASUGREL HCL 10 MG PO TABS
10.0000 mg | ORAL_TABLET | Freq: Every day | ORAL | Status: DC
  Filled 2015-12-31: qty 1

## 2015-12-31 MED ORDER — ATORVASTATIN CALCIUM 40 MG PO TABS
40.0000 mg | ORAL_TABLET | Freq: Every day | ORAL | Status: DC
Start: 2015-12-31 — End: 2016-01-01
  Administered 2015-12-31: 40 mg via ORAL
  Filled 2015-12-31: qty 1

## 2015-12-31 MED ORDER — SODIUM CHLORIDE 0.9 % IV SOLN: 250.0000 mL | INTRAVENOUS | Status: DC | PRN

## 2015-12-31 MED ORDER — ASPIRIN 81 MG PO CHEW
81.0000 mg | CHEWABLE_TABLET | ORAL | Status: AC
  Administered 2015-12-31: 81 mg via ORAL

## 2015-12-31 MED ORDER — SODIUM CHLORIDE 0.9% FLUSH
3.0000 mL | Freq: Two times a day (BID) | INTRAVENOUS | Status: DC
  Administered 2015-12-31: 3 mL via INTRAVENOUS

## 2015-12-31 MED ORDER — LOSARTAN POTASSIUM 50 MG PO TABS
50.0000 mg | ORAL_TABLET | Freq: Every day | ORAL | Status: DC
  Administered 2015-12-31 – 2016-01-01 (×2): 50 mg via ORAL
  Filled 2015-12-31 (×2): qty 1

## 2015-12-31 MED ORDER — SODIUM CHLORIDE 0.9 % WEIGHT BASED INFUSION
3.0000 mL/kg/h | INTRAVENOUS | Status: DC
  Administered 2015-12-31: 3 mL/kg/h via INTRAVENOUS

## 2015-12-31 MED ORDER — NITROGLYCERIN IN D5W 200-5 MCG/ML-% IV SOLN
10.0000 ug/min | INTRAVENOUS | Status: DC
  Administered 2015-12-31: 11:00:00 10 ug/min via INTRAVENOUS
  Filled 2015-12-31 (×2): qty 250

## 2015-12-31 MED ORDER — LIDOCAINE HCL (PF) 1 % IJ SOLN
INTRAMUSCULAR | Status: AC
  Filled 2015-12-31: qty 30

## 2015-12-31 MED ORDER — SODIUM CHLORIDE 0.9 % WEIGHT BASED INFUSION: 1.0000 mL/kg/h | INTRAVENOUS | Status: DC

## 2015-12-31 MED ORDER — PRASUGREL HCL 10 MG PO TABS
ORAL_TABLET | ORAL | Status: AC
  Filled 2015-12-31: qty 6

## 2015-12-31 MED ORDER — ASPIRIN EC 81 MG PO TBEC
81.0000 mg | DELAYED_RELEASE_TABLET | Freq: Every day | ORAL | Status: DC
Start: 2015-12-31 — End: 2016-01-01
  Administered 2016-01-01: 81 mg via ORAL
  Filled 2015-12-31: qty 1

## 2015-12-31 MED ORDER — SODIUM CHLORIDE 0.9% FLUSH: 3.0000 mL | INTRAVENOUS | Status: DC | PRN

## 2015-12-31 MED ORDER — SODIUM CHLORIDE 0.9 % IV SOLN
INTRAVENOUS | Status: DC | PRN
  Administered 2015-12-31: 1.75 mg/kg/h via INTRAVENOUS

## 2015-12-31 MED ORDER — IOPAMIDOL (ISOVUE-370) INJECTION 76%
INTRAVENOUS | Status: AC
  Filled 2015-12-31: qty 100

## 2015-12-31 MED ORDER — IOPAMIDOL (ISOVUE-370) INJECTION 76%
INTRAVENOUS | Status: DC | PRN
  Administered 2015-12-31: 150 mL via INTRA_ARTERIAL

## 2015-12-31 MED ORDER — OXYCODONE-ACETAMINOPHEN 5-325 MG PO TABS
1.0000 | ORAL_TABLET | ORAL | Status: DC | PRN
  Administered 2015-12-31 – 2016-01-01 (×4): 2 via ORAL
  Filled 2015-12-31 (×5): qty 2

## 2015-12-31 MED ORDER — FENTANYL CITRATE (PF) 100 MCG/2ML IJ SOLN
INTRAMUSCULAR | Status: DC | PRN
  Administered 2015-12-31 (×2): 25 ug via INTRAVENOUS

## 2015-12-31 MED ORDER — NITROGLYCERIN 0.4 MG SL SUBL: 0.4000 mg | SUBLINGUAL_TABLET | SUBLINGUAL | Status: DC | PRN

## 2015-12-31 MED ORDER — BIVALIRUDIN BOLUS VIA INFUSION - CUPID
INTRAVENOUS | Status: DC | PRN
  Administered 2015-12-31: 69.75 mg via INTRAVENOUS

## 2015-12-31 MED ORDER — MIDAZOLAM HCL 2 MG/2ML IJ SOLN
INTRAMUSCULAR | Status: DC | PRN
  Administered 2015-12-31 (×2): 1 mg via INTRAVENOUS

## 2015-12-31 MED ORDER — METOPROLOL SUCCINATE ER 25 MG PO TB24
12.5000 mg | ORAL_TABLET | Freq: Every day | ORAL | Status: DC
  Administered 2015-12-31 – 2016-01-01 (×2): 12.5 mg via ORAL
  Filled 2015-12-31 (×2): qty 1

## 2015-12-31 MED ORDER — INFLUENZA VAC SPLIT QUAD 0.5 ML IM SUSY
0.5000 mL | PREFILLED_SYRINGE | INTRAMUSCULAR | Status: AC
  Administered 2016-01-01: 09:00:00 0.5 mL via INTRAMUSCULAR
  Filled 2015-12-31: qty 0.5

## 2015-12-31 MED ORDER — ANGIOPLASTY BOOK
Freq: Once | Status: AC
  Administered 2015-12-31: 22:00:00
  Filled 2015-12-31: qty 1

## 2015-12-31 MED ORDER — HEPARIN (PORCINE) IN NACL 2-0.9 UNIT/ML-% IJ SOLN
INTRAMUSCULAR | Status: DC | PRN
  Administered 2015-12-31: 1000 mL

## 2015-12-31 MED ORDER — SODIUM CHLORIDE 0.9 % IV SOLN
INTRAVENOUS | Status: AC
  Administered 2015-12-31: 10:00:00 via INTRAVENOUS

## 2015-12-31 MED ORDER — HYDRALAZINE HCL 20 MG/ML IJ SOLN
10.0000 mg | INTRAMUSCULAR | Status: DC | PRN
  Administered 2015-12-31: 12:00:00 10 mg via INTRAVENOUS
  Filled 2015-12-31: qty 1

## 2015-12-31 MED ORDER — PNEUMOCOCCAL VAC POLYVALENT 25 MCG/0.5ML IJ INJ
0.5000 mL | INJECTION | INTRAMUSCULAR | Status: AC
Start: 2015-12-31 — End: 2016-01-01
  Administered 2016-01-01: 0.5 mL via INTRAMUSCULAR
  Filled 2015-12-31 (×2): qty 0.5

## 2015-12-31 SURGICAL SUPPLY — 18 items
BALLN ANGIOSCULPT RX 3.0X10 (BALLOONS) ×3
BALLN MINITREK RX 2.0X15 (BALLOONS) ×3
BALLOON ANGIOSCULPT RX 3.0X10 (BALLOONS) IMPLANT
BALLOON MINITREK RX 2.0X15 (BALLOONS) IMPLANT
CATH INFINITI 5 FR RCB (CATHETERS) ×2 IMPLANT
CATH INFINITI 5FR JL5 (CATHETERS) ×2 IMPLANT
CATH INFINITI 5FR MULTPACK ANG (CATHETERS) ×2 IMPLANT
CATH SITESEER 5F NTR (CATHETERS) ×2 IMPLANT
GUIDE CATH RUNWAY 6FR VL3.5 (CATHETERS) ×2 IMPLANT
KIT ENCORE 26 ADVANTAGE (KITS) ×2 IMPLANT
KIT HEART LEFT (KITS) ×3 IMPLANT
PACK CARDIAC CATHETERIZATION (CUSTOM PROCEDURE TRAY) ×3 IMPLANT
SHEATH PINNACLE 5F 10CM (SHEATH) ×2 IMPLANT
SHEATH PINNACLE 6F 10CM (SHEATH) ×2 IMPLANT
SYR MEDRAD MARK V 150ML (SYRINGE) ×3 IMPLANT
TRANSDUCER W/STOPCOCK (MISCELLANEOUS) ×3 IMPLANT
WIRE EMERALD 3MM-J .035X150CM (WIRE) ×2 IMPLANT
WIRE RUNTHROUGH .014X180CM (WIRE) ×2 IMPLANT

## 2015-12-31 NOTE — Care Management Note (Addendum)
Case Management Note  Patient Details  Name: [name] [name] MRN: [id number] Date of Birth: 05-Mar-1951  Subjective/Objective:     Chest pain               Action/Plan: Discharge Planning: NCM spoke to pt and states he had Medicaid previously. Development worker, community referral for [employer]. Will provide pt with [place] letter to get meds for $3.   12/31/2015 1645 NCM spoke to pt and states he pays for his medications out of pocket and stopped taking Plavix due to cost. Effient is $477 out of pocket with goodrx coupon. 30 day free trial cards expired in 10/2015. Called attending to make aware. Effient was switched to Brilinta. Provided pt with Brilinta 30 day free trial card. Explained [place] program and it can be used once per year. copay will be $3 for his other meds. Explained he will need to pick up meds at [facility]. Attempted to arrange follow up appt with PCP at [facility] and no available appt. Pt will follow up with Cardiologist post dc. Financial Counselor did come to see pt to start Medicaid application. When pt receives Mediciad, he will be assigned a PCP. Provide pt with Brilinta Patient Assistance Program to have his MD complete.     01/01/2016 1114 Provided pt with [place] letter. Contacted [facility] and his other RX are $67. Pt states he cannot afford $67. Explained MATCH can be used once per year with $3 copay for his meds except Brilinta. Pt has 30 day free trial card for Brilinta. [facility] does have Brilinta in [facility]. Explained to pt he will need to contact his Medicaid CW to follow up on Medicaid application started by [employer].   Expected Discharge Date:  01/01/2016               Expected Discharge Plan:  Home/Self Care  In-House Referral:  NA  Discharge planning Services  CM Consult, Medication Assistance  Post Acute Care Choice:  NA Choice offered to:  NA  DME Arranged:  N/A DME Agency:  NA  HH Arranged:  NA HH Agency:  NA  Status of Service:  In process, will continue  to follow  If discussed at Long Length of Stay Meetings, dates discussed:    Additional Comments:  Erenest Rasher, RN 12/31/2015, 12:42 PM

## 2015-12-31 NOTE — Progress Notes (Signed)
Site area: right groin  Site Prior to Removal:  Level 0  Pressure Applied For 20 MINUTES    Minutes Beginning at 1225  Manual:   Yes.    Patient Status During Pull:  AAOX4  Post Pull Groin Site:  Level 0  Post Pull Instructions Given:  Yes.    Post Pull Pulses Present:  Yes.    Dressing Applied:  Yes.    Comments:  Tolerated procedure well. Post sheath pull instructions given.

## 2015-12-31 NOTE — H&P (Signed)
Dictated H&P in the chart needs to be scanned 

## 2015-12-31 NOTE — Interval H&P Note (Signed)
Cath Lab Visit (complete for each Cath Lab visit)  Clinical Evaluation Leading to the Procedure:   ACS: No.  Non-ACS:    Anginal Classification: CCS III  Anti-ischemic medical therapy: Maximal Therapy (2 or more classes of medications)  Non-Invasive Test Results: High-risk stress test findings: cardiac mortality >3%/year  Prior CABG: No previous CABG      History and Physical Interval Note:  12/31/2015 7:40 AM  [name] [name]  has presented today for surgery, with the diagnosis of cp  The various methods of treatment have been discussed with the patient and family. After consideration of risks, benefits and other options for treatment, the patient has consented to  Procedure(s): Left Heart Cath and Coronary Angiography (N/A) as a surgical intervention .  The patient's history has been reviewed, patient examined, no change in status, stable for surgery.  I have reviewed the patient's chart and labs.  Questions were answered to the patient's satisfaction.     Charolette Forward

## 2016-01-01 ENCOUNTER — Encounter (HOSPITAL_COMMUNITY): Payer: Self-pay | Admitting: Cardiology

## 2016-01-01 LAB — BASIC METABOLIC PANEL
ANION GAP: 3 — AB (ref 5–15)
BUN: 10 mg/dL (ref 6–20)
CALCIUM: 8.7 mg/dL — AB (ref 8.9–10.3)
CO2: 24 mmol/L (ref 22–32)
Chloride: 109 mmol/L (ref 101–111)
Creatinine, Ser: 1.35 mg/dL — ABNORMAL HIGH (ref 0.61–1.24)
GFR, EST NON AFRICAN AMERICAN: 54 mL/min — AB (ref >60)
Glucose, Bld: 93 mg/dL (ref 65–99)
POTASSIUM: 3.9 mmol/L (ref 3.5–5.1)
Sodium: 136 mmol/L (ref 135–145)

## 2016-01-01 LAB — CBC
HEMATOCRIT: 39.1 % (ref 39.0–52.0)
HEMOGLOBIN: 12.9 g/dL — AB (ref 13.0–17.0)
MCH: 31.6 pg (ref 26.0–34.0)
MCHC: 33 g/dL (ref 30.0–36.0)
MCV: 95.8 fL (ref 78.0–100.0)
Platelets: 180 10*3/uL (ref 150–400)
RBC: 4.08 MIL/uL — AB (ref 4.22–5.81)
RDW: 14.1 % (ref 11.5–15.5)
WBC: 9.4 10*3/uL (ref 4.0–10.5)

## 2016-01-01 MED ORDER — ATORVASTATIN CALCIUM 40 MG PO TABS: 40.0000 mg | ORAL_TABLET | Freq: Every day | ORAL | 3 refills | Status: DC

## 2016-01-01 MED ORDER — TICAGRELOR 90 MG PO TABS: 90.0000 mg | ORAL_TABLET | Freq: Two times a day (BID) | ORAL | 3 refills | Status: DC

## 2016-01-01 MED ORDER — NITROGLYCERIN 0.4 MG SL SUBL: 0.4000 mg | SUBLINGUAL_TABLET | SUBLINGUAL | 12 refills | Status: AC | PRN

## 2016-01-01 MED ORDER — LOSARTAN POTASSIUM 50 MG PO TABS: 50.0000 mg | ORAL_TABLET | Freq: Every day | ORAL | 3 refills | Status: DC

## 2016-01-01 NOTE — Discharge Summary (Signed)
[name], [name]             ACCOUNT NO.:  [id number]  MEDICAL RECORD NO.:  [id number]  LOCATION:  [id number]                        FACILITY:  [facility]  PHYSICIAN:  Jaylon Boylen N. Terrence Dupont, M.D. DATE OF BIRTH:  Jul 25, 1950  DATE OF ADMISSION:  12/31/2015 DATE OF DISCHARGE:  01/01/2016                              DISCHARGE SUMMARY   ADMITTING DIAGNOSES: 1. Unstable angina.  Positive nuclear stress test, rule out     progression of coronary artery disease. 2. Coronary artery disease, history of percutaneous coronary     intervention to proximal and mid left anterior descending in the     past. 3. Hypertension. 4. Hyperlipidemia. 5. Osteoarthritis. 6. Tobacco abuse.  DISCHARGE DIAGNOSES: 1. Unstable angina, status post left cardiac     catheterization/percutaneous transluminal coronary angioplasty to     in-stent restenosis of mid left anterior descending with excellent     angiographic results. 2. Coronary artery disease, history of percutaneous coronary     intervention to proximal and mid left anterior descending in past. 3. Hypertension. 4. Hyperlipidemia. 5. Osteoarthritis. 6. Tobacco abuse. 7. History of hepatitis C in the past.  DISCHARGE HOME MEDICATIONS: 1. Losartan 50 mg one tablet daily. 2. Brilinta 90 mg one tablet twice daily. 3. Albuterol inhaler 1-2 puffs every 6 hours as needed as before. 4. Amlodipine 5 mg one tablet daily. 5. Aspirin 81 mg one tablet daily. 6. Metoprolol succinate 25 mg half tablet daily. 7. Nitrostat sublingual use as directed. 8. Percocet 1-2 tablets as before every 8 hours. 9. Atorvastatin 40 mg daily. 10.The patient has been advised to stop ibuprofen.  DIET:  Low salt, low cholesterol.  Post cardiac cath/PTCA instructions have been given.  FOLLOWUP:  Follow up with me in 1 week.  CONDITION AT DISCHARGE:  Stable.  The patient will be scheduled for phase 2 cardiac rehab as outpatient.  BRIEF HISTORY AND HOSPITAL COURSE:  [name] [name]  is a 65 year old male with past medical history significant for coronary artery disease, history of PTCA, stenting to proximal LAD in January 2003, subsequently had PTCA stenting to mid LAD in September 2013, hypertension, hyperlipidemia, history of tobacco abuse, history of hepatitis C, degenerative joint disease, history of carcinoma of colon in remission, positive family history of coronary artery disease, he came to the office complaining of vague retrosternal chest discomfort associated with progressive increasing shortness of breath and feeling weak, tired, fatigue.  The patient had nuclear stress test recently, which showed large area of severe reversible perfusion defect in the apex and echo septal and apical inferior wall with EF of 40%.  The patient initially opted for medical management, but due to recurrent chest discomfort, worsening dyspnea agreed for invasive treatment.  The patient denies any PND, orthopnea, or leg swelling.  Denies palpitation, lightheadedness, or syncope.  PHYSICAL EXAMINATION:  GENERAL:  He was alert, awake, oriented x3, in no acute distress. VITAL SIGNS:  His blood pressure was 142/95 and pulse 71. EYES:  Conjunctivae are pink. NECK:  Supple.  No JVD.  No bruit. LUNGS:  Clear to auscultation without rhonchi or rales. CARDIOVASCULAR:  S1, S2 was normal.  There were soft systolic murmur and S4 gallop. ABDOMEN:  Soft.  Bowel sounds are present.  Nontender. EXTREMITIES:  There is no clubbing, cyanosis, or edema. NEUROLOGIC:  Grossly intact.  LABORATORY DATA:  Hemoglobin was 14.5, hematocrit 43.4, and white count of 9.2.  His BUN 11, creatinine 1.39, sodium is 138, and potassium 4.2. Postprocedure his labs this morning, creatinine is 1.5, BUN 10, potassium 3.9, hemoglobin 12.9, hematocrit 39.1, and white count of 9.4. His EKG shows normal sinus rhythm with poor R-wave progression in anterior leads.  BRIEF HOSPITAL COURSE:  The patient was a.m. admit  and underwent left cardiac catheterization and PTCA stenting to mid LAD with excellent angiographic results.  The patient tolerated the procedure well.  Post procedure, the patient did not have any episodes of chest pain during the hospital stay.  His groin is stable with no evidence of hematoma or bruit.  The patient is ambulating in room without any problems.  The patient will be seen by phase 1 rehab and will be scheduled for phase 2 cardiac rehab as outpatient, discussed with the patient at length regarding lifestyle changes, compliance with medication, and smoking cessation to which he agrees.  The patient will be discharged home on above medications and will be followed up in my office in 1 week.     Allegra Lai. Terrence Dupont, M.D.     MNH/MEDQ  D:  01/01/2016  T:  01/01/2016  Job:  [id number]

## 2016-01-01 NOTE — Progress Notes (Signed)
CARDIAC REHAB PHASE I   PRE:  Rate/Rhythm: 35 SR  BP:  Sitting: 157/96        SaO2: 98 RA  MODE:  Ambulation: 500 ft   POST:  Rate/Rhythm: 79 SR  BP:  Sitting: 179/102, 157/94 recheck after 5 minutes rest         SaO2: 100 RA  Pt ambulated 500 ft on RA, handheld assist, steady gait, tolerated well.  Pt c/o mild DOE, "feeling a little wobbly", denies cp, dizziness, declined rest stop. BP elevated. Completed PCI education.  Reviewed risk factors, tobacco cessation, anti-platelet therapy, activity restrictions, ntg, exercise, heart healthy diet, and phase 2 cardiac rehab. Pt verbalized understanding. Pt agrees to phase 2 cardiac rehab referral, has done program in the past, will send to [facility] per pt request. Pt to bed per pt request after walk, call bell within reach.   [place], RN, BSN 01/01/2016 8:48 AM

## 2016-01-01 NOTE — Discharge Instructions (Signed)
Coronary Angiogram With Stent Coronary angiography with stent placement is a procedure to widen or open a narrow blood vessel of the heart (coronary artery). When a coronary artery becomes partially blocked, it decreases blood flow to that area. This may lead to chest pain or a heart attack (myocardial infarction). Arteries may become blocked by cholesterol buildup (plaque) in the lining or wall.  A stent is a small piece of metal that looks like a mesh or a spring. Stent placement may be done right after a coronary angiography in which a blocked artery is found or as a treatment for a heart attack.  LET [facility] CARE PROVIDER KNOW ABOUT:  Any allergies you have.   All medicines you are taking, including vitamins, herbs, eye drops, creams, and over-the-counter medicines.   Previous problems you or members of your family have had with the use of anesthetics.   Any blood disorders you have.   Previous surgeries you have had.   Medical conditions you have. RISKS AND COMPLICATIONS Generally, coronary angiography with stent is a safe procedure. However, problems can occur and include:  Damage to the heart or its blood vessels.   A return of blockage.   Bleeding, infection, or bruising at the insertion site.   A collection of blood under the skin (hematoma) at the insertion site.  Blood clot in another part of the body.   Kidney injury.   Allergic reaction to the dye or contrast used.   Bleeding into the abdomen (retroperitoneal bleeding). BEFORE THE PROCEDURE  Do not eat or drink anything after midnight on the night before the procedure or as directed by your health care provider.  Ask your health care provider about changing or stopping your regular medicines. This is especially important if you are taking diabetes medicines or blood thinners.  Your health care provider will make sure you understand the procedure as well as the risks and potential problems  associated with the procedure.  PROCEDURE  You may be given a medicine to help you relax before and during the procedure (sedative). This medicine will be given through an IV tube that is put into one of your veins.   The area where the catheter will be inserted will be shaved and cleaned. This is usually done in the groin but may be done in the fold of your arm (near your elbow) or in the wrist.   A medicine will be given to numb the area where the catheter will be inserted (local anesthetic).   The catheter will be inserted into an artery using a guide wire. A type of X-ray (fluoroscopy) will be used to help guide the catheter to the opening of the blocked artery.   A dye will then be injected into the catheter, and X-rays will be taken. The dye will help to show where any narrowing or blockages are located in the heart arteries.   A tiny wire will be guided to the blocked spot, and a balloon will be inflated to make the artery wider. The stent will be expanded and will crush the plaque into the wall of the vessel. The stent will hold the area open like a scaffolding and improve the blood flow.   Sometimes the artery may be made wider using a laser or other tools to remove plaque.   When the blood flow is better, the catheter will be removed. The lining of the artery will grow over the stent, which stays where it was placed.  AFTER THE PROCEDURE  If the procedure is done through the leg, you will be kept in bed lying flat for about 6 hours. You will be instructed to not bend or cross your legs.   The insertion site will be checked frequently.   The pulse in your feet or wrist will be checked frequently.   Additional blood tests, X-rays, and electrocardiography may be done.   This information is not intended to replace advice given to you by your health care provider. Make sure you discuss any questions you have with your health care provider.   Document Released:  09/04/2002 Document Revised: 03/21/2014 Document Reviewed: 07/23/2012 Elsevier Interactive Patient Education 2016 [employer] Acute Coronary Syndrome Acute coronary syndrome (ACS) is a serious problem in which there is suddenly not enough blood and oxygen supplied to the heart. ACS may mean that one or more of the blood vessels in your heart (coronary arteries) may be blocked. ACS can result in chest pain or a heart attack (myocardial infarction or MI). CAUSES This condition is caused by atherosclerosis, which is the buildup of fat and cholesterol (plaque) on the inside of the arteries. Over time, the plaque may narrow or block the artery, and this will lessen blood flow to the heart. Plaque can also become weak and break off within a coronary artery to form a clot and cause a sudden blockage. RISK FACTORS The risks factors of this condition include:  High cholesterol levels.  High blood pressure (hypertension).  Smoking.  Diabetes.  Age.  Family history of chest pain, heart disease, or stroke.  Lack of exercise. SYMPTOMS The most common signs of this condition include:  Chest pain, which can be:  A crushing or squeezing in the chest.  A tightness, pressure, fullness, or heaviness in the chest.  Present for more than a few minutes, or it can stop and recur.  Pain in the arms, neck, jaw, or back.  Unexplained heartburn or indigestion.  Shortness of breath.  Nausea.  Sudden cold sweats.  Feeling light-headed or dizzy. Sometimes, this condition has no symptoms. DIAGNOSIS ACS may be diagnosed through the following tests:  Electrocardiogram (ECG).  Blood tests.  Coronary angiogram. This is a procedure to look at the coronary arteries to see if there is any blockage. TREATMENT Treatment for ACS may include:  Healthy behavioral changes to reduce or control risk factors.  Medicine.  Coronary stenting.A stent helps to keep an artery open.  Coronary  angioplasty. This procedure widens a narrowed or blocked artery.  Coronary artery bypass surgery. This will allow your blood to pass the blockage (bypass) to reach your heart. HOME CARE INSTRUCTIONS Eating and Drinking  Follow a heart-healthy diet. A dietitian can you help to educate you about healthy food options and changes.  Use healthy cooking methods such as roasting, grilling, broiling, baking, poaching, steaming, or stir-frying. Talk to a dietitian to learn more about healthy cooking methods. Medicines  Take medicines only as directed by your health care provider.  Do not take the following medicines unless your health care provider approves:  Nonsteroidal anti-inflammatory drugs (NSAIDs), such as ibuprofen, naproxen, or celecoxib.  Vitamin supplements that contain vitamin A, vitamin E, or both.  Hormone replacement therapy that contains estrogen with or without progestin.  Stop illegal drug use. Activities  Follow an exercise program that is approved by your health care provider.  Plan rest periods when you are fatigued. Lifestyle  Do not use any tobacco products, including cigarettes, chewing tobacco,  or electronic cigarettes. If you need help quitting, ask your health care provider.  If you drink alcohol, and your health care provider approves, limit your alcohol intake to no more than 1 drink per day. One drink equals 12 ounces of beer, 5 ounces of wine, or 1 ounces of hard liquor.  Learn to manage stress.  Maintain a healthy weight. Lose weight as approved by your health care provider. General Instructions  Manage other health conditions, such as hypertension and diabetes, as directed by your health care provider.  Keep all follow-up visits as directed by your health care provider. This is important.  Your health care provider may ask you to monitor your blood pressure. A blood pressure reading consists of a higher number over a lower number, such as 110 over  72, written as 110/72. Ideally, your blood pressure should be:  Below 140/90 if you have no other medical conditions.  Below 130/80 if you have diabetes or kidney disease. SEEK IMMEDIATE MEDICAL CARE IF:  You have pain in your chest, neck, arm, jaw, stomach, or back that lasts more than a few minutes, is recurring, or is not relieved by taking medicine under your tongue (sublingual nitroglycerin).  You have profuse sweating without cause.  You have unexplained:  Heartburn or indigestion.  Shortness of breath or difficulty breathing.  Nausea or vomiting.  Fatigue.  Feelings of nervousness or anxiety.  Weakness.  Diarrhea.  You have sudden light-headedness or dizziness.  You faint. These symptoms may represent a serious problem that is an emergency. Do not wait to see if the symptoms will go away. Get medical help right away. Call your local emergency services (911 in the [place] Do not drive yourself to the clinic or hospital.   This information is not intended to replace advice given to you by your health care provider. Make sure you discuss any questions you have with your health care provider.   Document Released: 02/28/2005 Document Revised: 03/21/2014 Document Reviewed: 07/02/2013 Elsevier Interactive Patient Education [employer].

## 2016-01-01 NOTE — Discharge Summary (Signed)
Discharge summary dictated on 01/01/2016 dictation number is [phone]

## 2016-05-02 ENCOUNTER — Encounter (HOSPITAL_COMMUNITY): Payer: Self-pay

## 2016-05-02 NOTE — Progress Notes (Signed)
Mailed patient letter with information about Cardiac Rehab program. MW °

## 2016-05-05 ENCOUNTER — Telehealth (HOSPITAL_COMMUNITY): Payer: Self-pay

## 2016-05-05 NOTE — Telephone Encounter (Signed)
Patient contacted me after receiving letter about Cardiac Rehab. Patient is interested in the Cardiac Rehab program. Patient informed we need to get qualifications from patient cardiologist before proceeding [facility] reimbursement form completed by cardiologist). Patient informed we will contact him once we get qualifications back from cardiologist.

## 2016-05-06 ENCOUNTER — Telehealth (HOSPITAL_COMMUNITY): Payer: Self-pay

## 2016-05-06 NOTE — Telephone Encounter (Signed)
Received fax : Medicaid authorization and last office note from Dr. Terrence Dupont for patient to start Cardiac Rehab. I spoke with Joann and left medicaid authorization and last office note on her desk. Arville Go will contact patient to schedule.

## 2016-05-30 ENCOUNTER — Telehealth (HOSPITAL_COMMUNITY): Payer: Self-pay | Admitting: Internal Medicine

## 2016-05-30 NOTE — Telephone Encounter (Signed)
Verified Medicaid insurance benefits through [employer]. Reference [phone], s/w pt on 03-15 and scheduled pt for orientation and exercising class. Advised pt would be contacted to do a nurse interview. Mailed out orientation homework. KJ

## 2016-06-16 ENCOUNTER — Encounter (HOSPITAL_COMMUNITY)
Admission: RE | Admit: 2016-06-16 | Discharge: 2016-06-16 | Disposition: A | Discharge status: Home / Self Care | Payer: Medicaid Other | Source: Ambulatory Visit | Attending: Cardiology | Admitting: Cardiology

## 2016-06-16 ENCOUNTER — Encounter (HOSPITAL_COMMUNITY): Payer: Self-pay

## 2016-06-16 VITALS — BP 130/92 | HR 56 | Ht 70.5 in | Wt 205.2 lb

## 2016-06-16 DIAGNOSIS — M1712 Unilateral primary osteoarthritis, left knee: Secondary | ICD-10-CM | POA: Insufficient documentation

## 2016-06-16 DIAGNOSIS — Z79899 Other long term (current) drug therapy: Secondary | ICD-10-CM | POA: Diagnosis not present

## 2016-06-16 DIAGNOSIS — I10 Essential (primary) hypertension: Secondary | ICD-10-CM | POA: Diagnosis not present

## 2016-06-16 DIAGNOSIS — Z955 Presence of coronary angioplasty implant and graft: Secondary | ICD-10-CM

## 2016-06-16 DIAGNOSIS — Z85038 Personal history of other malignant neoplasm of large intestine: Secondary | ICD-10-CM | POA: Diagnosis not present

## 2016-06-16 DIAGNOSIS — I25119 Atherosclerotic heart disease of native coronary artery with unspecified angina pectoris: Secondary | ICD-10-CM | POA: Diagnosis present

## 2016-06-16 DIAGNOSIS — F17210 Nicotine dependence, cigarettes, uncomplicated: Secondary | ICD-10-CM | POA: Insufficient documentation

## 2016-06-16 NOTE — Progress Notes (Signed)
Cardiac Individual Treatment Plan  Patient Details  Name: [name] [name] MRN: [id number] Date of Birth: [birth year] Referring Provider:     CARDIAC REHAB PHASE II ORIENTATION from 06/16/2016 in [facility]  Referring Provider  Harwani. Prudencio Burly MD      Initial Encounter Date:    CARDIAC REHAB PHASE II ORIENTATION from 06/16/2016 in [facility]  Date  06/16/16  Referring Provider  Harwani. Mohan MD      Visit Diagnosis: 12/30/16 Status post coronary artery stent placement  Patient's Home Medications on Admission:  Current Outpatient Prescriptions:  .  albuterol (PROVENTIL HFA;VENTOLIN HFA) 108 (90 Base) MCG/ACT inhaler, Inhale 1-2 puffs into the lungs every 6 (six) hours as needed for wheezing or shortness of breath., Disp: , Rfl:  .  aspirin EC 81 MG EC tablet, Take 1 tablet (81 mg total) by mouth daily., Disp: 30 tablet, Rfl: 3 .  atorvastatin (LIPITOR) 40 MG tablet, Take 1 tablet (40 mg total) by mouth daily at 6 PM., Disp: 30 tablet, Rfl: 3 .  losartan (COZAAR) 50 MG tablet, Take 1 tablet (50 mg total) by mouth daily., Disp: 30 tablet, Rfl: 3 .  metoprolol succinate (TOPROL-XL) 25 MG 24 hr tablet, Take 12.5 mg by mouth daily. , Disp: , Rfl:  .  nitroGLYCERIN (NITROSTAT) 0.4 MG SL tablet, Place 1 tablet (0.4 mg total) under the tongue every 5 (five) minutes as needed for chest pain., Disp: 25 tablet, Rfl: 12 .  ticagrelor (BRILINTA) 90 MG TABS tablet, Take 1 tablet (90 mg total) by mouth 2 (two) times daily., Disp: 60 tablet, Rfl: 3  Past Medical History: Past Medical History:  Diagnosis Date  . Anginal pain ([place])   . Cancer ([place])    colon  . Coronary artery disease   . Hepatitis    c  . Hypertension   . Osteoarthritis of left knee 06/21/2011  . Shortness of breath     Tobacco Use: History  Smoking Status  . Current Every Day Smoker  . Packs/day: 0.25  . Years: 7.00  . Types: Cigarettes  Smokeless Tobacco  .  Never Used    Labs: Recent Review Flowsheet Data    Labs for ITP Cardiac and Pulmonary Rehab Latest Ref Rng & Units 07/07/2009 01/21/2011 01/22/2011 01/24/2011 08/03/2013   Cholestrol 0 - 200 mg/dL 164 - 147 126 118   LDLCALC 0 - 99 mg/dL 82 - - - 61   HDL >39 mg/dL 51 - - - 43   Trlycerides <150 mg/dL 153(H) 104 114 111 68      Capillary Blood Glucose: Lab Results  Component Value Date   GLUCAP 109 (H) 01/23/2011   GLUCAP 105 (H) 01/23/2011   GLUCAP 131 (H) 01/23/2011   GLUCAP 122 (H) 01/23/2011   GLUCAP 119 (H) 01/22/2011     Exercise Target Goals: Date: 06/16/16  Exercise Program Goal: Individual exercise prescription set with THRR, safety & activity barriers. Participant demonstrates ability to understand and report RPE using BORG scale, to self-measure pulse accurately, and to acknowledge the importance of the exercise prescription.  Exercise Prescription Goal: Starting with aerobic activity 30 plus minutes a day, 3 days per week for initial exercise prescription. Provide home exercise prescription and guidelines that participant acknowledges understanding prior to discharge.  Activity Barriers & Risk Stratification:     Activity Barriers & Cardiac Risk Stratification - 06/16/16 0811      Activity Barriers & Cardiac Risk Stratification  Activity Barriers Right Knee Replacement;Left Knee Replacement   Cardiac Risk Stratification High      6 Minute Walk:     6 Minute Walk    Row Name 06/16/16 1222         6 Minute Walk   Phase Initial     Distance 1800 feet     Walk Time 6 minutes     # of Rest Breaks 0     MPH 3.4     METS 3.88     RPE 11     VO2 Peak 13.6     Symptoms No     Resting HR 56 bpm     Resting BP 144/98  rchk 130/92     Max Ex. HR 77 bpm     Max Ex. BP 140/96     2 Minute Post BP 144/86        Oxygen Initial Assessment:   Oxygen Re-Evaluation:   Oxygen Discharge (Final Oxygen Re-Evaluation):   Initial Exercise  Prescription:     Initial Exercise Prescription - 06/16/16 1200      Date of Initial Exercise RX and Referring Provider   Date 06/16/16   Referring Provider Harwani. Mohan MD     Treadmill   MPH 2.8   Grade 0   Minutes 10   METs 3.14     Bike   Level 1   Minutes 10   METs 2.99     NuStep   Level 3   SPM 80   Minutes 10   METs 2     Prescription Details   Frequency (times per week) 3   Duration Progress to 30 minutes of continuous aerobic without signs/symptoms of physical distress     Intensity   THRR 40-80% of Max Heartrate 62-125   Ratings of Perceived Exertion 11-13   Perceived Dyspnea 0-4     Progression   Progression Continue to progress workloads to maintain intensity without signs/symptoms of physical distress.     Resistance Training   Training Prescription Yes   Weight 4lbs   Reps 10-15      Perform Capillary Blood Glucose checks as needed.  Exercise Prescription Changes:   Exercise Comments:   Exercise Goals and Review:     Exercise Goals    Row Name 06/16/16 [phone]             Exercise Goals   Increase Physical Activity Yes       Intervention Provide advice, education, support and counseling about physical activity/exercise needs.;Develop an individualized exercise prescription for aerobic and resistive training based on initial evaluation findings, risk stratification, comorbidities and participant's personal goals.       Expected Outcomes Achievement of increased cardiorespiratory fitness and enhanced flexibility, muscular endurance and strength shown through measurements of functional capacity and personal statement of participant.       Increase Strength and Stamina Yes       Intervention Provide advice, education, support and counseling about physical activity/exercise needs.;Develop an individualized exercise prescription for aerobic and resistive training based on initial evaluation findings, risk stratification, comorbidities and  participant's personal goals.       Expected Outcomes Achievement of increased cardiorespiratory fitness and enhanced flexibility, muscular endurance and strength shown through measurements of functional capacity and personal statement of participant.          Exercise Goals Re-Evaluation :    Discharge Exercise Prescription (Final Exercise Prescription Changes):   Nutrition:  Target Goals: Understanding  of nutrition guidelines, daily intake of sodium 1500mg , cholesterol 200mg , calories 30% from fat and 7% or less from saturated fats, daily to have 5 or more servings of fruits and vegetables.  Biometrics:     Pre Biometrics - 06/16/16 1230      Pre Biometrics   Height 5' 10.5" (1.791 m)   Weight 205 lb 4 oz (93.1 kg)   Waist Circumference 40.5 inches   Hip Circumference 42 inches   Waist to Hip Ratio 0.96 %   BMI (Calculated) 29.1   Triceps Skinfold 19 mm   % Body Fat 28.8 %   Grip Strength 42 kg   Flexibility 12 in   Single Leg Stand 3.5 seconds       Nutrition Therapy Plan and Nutrition Goals:   Nutrition Discharge: Nutrition Scores:   Nutrition Goals Re-Evaluation:   Nutrition Goals Re-Evaluation:   Nutrition Goals Discharge (Final Nutrition Goals Re-Evaluation):   Psychosocial: Target Goals: Acknowledge presence or absence of significant depression and/or stress, maximize coping skills, provide positive support system. Participant is able to verbalize types and ability to use techniques and skills needed for reducing stress and depression.  Initial Review & Psychosocial Screening:     Initial Psych Review & Screening - 06/16/16 1734      Initial Review   Current issues with Current Anxiety/Panic;History of Depression   Source of Stress Concerns Financial;Unable to participate in former interests or hobbies   Comments reports financial stress regarding caring for self and paying for medications.     Family Dynamics   Good Support System? Yes       Quality of Life Scores:     Quality of Life - 06/16/16 1232      Quality of Life Scores   Health/Function Pre 15.6 %   Socioeconomic Pre 21.43 %   Psych/Spiritual Pre 14.57 %   Family Pre 19.2 %   GLOBAL Pre 17.12 %      PHQ-9: Recent Review Flowsheet Data    Depression screen [facility] 2/9 01/02/2012   Decreased Interest 0   Down, Depressed, Hopeless 0   PHQ - 2 Score 0     Interpretation of Total Score  Total Score Depression Severity:  1-4 = Minimal depression, 5-9 = Mild depression, 10-14 = Moderate depression, 15-19 = Moderately severe depression, 20-27 = Severe depression   Psychosocial Evaluation and Intervention:   Psychosocial Re-Evaluation:   Psychosocial Discharge (Final Psychosocial Re-Evaluation):   Vocational Rehabilitation: Provide vocational rehab assistance to qualifying candidates.   Vocational Rehab Evaluation & Intervention:     Vocational Rehab - 06/16/16 1738      Initial Vocational Rehab Evaluation & Intervention   Assessment shows need for Vocational Rehabilitation No      Education: Education Goals: Education classes will be provided on a weekly basis, covering required topics. Participant will state understanding/return demonstration of topics presented.  Learning Barriers/Preferences:     Learning Barriers/Preferences - 06/16/16 8182      Learning Barriers/Preferences   Learning Barriers None   Learning Preferences Written Material      Education Topics: Count Your Pulse:  -Group instruction provided by verbal instruction, demonstration, patient participation and written materials to support subject.  Instructors address importance of being able to find your pulse and how to count your pulse when at home without a heart monitor.  Patients get hands on experience counting their pulse with staff help and individually.   Heart Attack, Angina, and Risk Factor Modification:  -Group  instruction provided by verbal instruction,  video, and written materials to support subject.  Instructors address signs and symptoms of angina and heart attacks.    Also discuss risk factors for heart disease and how to make changes to improve heart health risk factors.   Functional Fitness:  -Group instruction provided by verbal instruction, demonstration, patient participation, and written materials to support subject.  Instructors address safety measures for doing things around the house.  Discuss how to get up and down off the floor, how to pick things up properly, how to safely get out of a chair without assistance, and balance training.   Meditation and Mindfulness:  -Group instruction provided by verbal instruction, patient participation, and written materials to support subject.  Instructor addresses importance of mindfulness and meditation practice to help reduce stress and improve awareness.  Instructor also leads participants through a meditation exercise.    Stretching for Flexibility and Mobility:  -Group instruction provided by verbal instruction, patient participation, and written materials to support subject.  Instructors lead participants through series of stretches that are designed to increase flexibility thus improving mobility.  These stretches are additional exercise for major muscle groups that are typically performed during regular warm up and cool down.   Hands Only CPR Anytime:  -Group instruction provided by verbal instruction, video, patient participation and written materials to support subject.  Instructors co-teach with AHA video for hands only CPR.  Participants get hands on experience with mannequins.   Nutrition I class: Heart Healthy Eating:  -Group instruction provided by PowerPoint slides, verbal discussion, and written materials to support subject matter. The instructor gives an explanation and review of the Therapeutic Lifestyle Changes diet recommendations, which includes a discussion on lipid goals,  dietary fat, sodium, fiber, plant stanol/sterol esters, sugar, and the components of a well-balanced, healthy diet.   Nutrition II class: Lifestyle Skills:  -Group instruction provided by PowerPoint slides, verbal discussion, and written materials to support subject matter. The instructor gives an explanation and review of label reading, grocery shopping for heart health, heart healthy recipe modifications, and ways to make healthier choices when eating out.   Diabetes Question & Answer:  -Group instruction provided by PowerPoint slides, verbal discussion, and written materials to support subject matter. The instructor gives an explanation and review of diabetes co-morbidities, pre- and post-prandial blood glucose goals, pre-exercise blood glucose goals, signs, symptoms, and treatment of hypoglycemia and hyperglycemia, and foot care basics.   Diabetes Blitz:  -Group instruction provided by PowerPoint slides, verbal discussion, and written materials to support subject matter. The instructor gives an explanation and review of the physiology behind type 1 and type 2 diabetes, diabetes medications and rational behind using different medications, pre- and post-prandial blood glucose recommendations and Hemoglobin A1c goals, diabetes diet, and exercise including blood glucose guidelines for exercising safely.    Portion Distortion:  -Group instruction provided by PowerPoint slides, verbal discussion, written materials, and food models to support subject matter. The instructor gives an explanation of serving size versus portion size, changes in portions sizes over the last 20 years, and what consists of a serving from each food group.   Stress Management:  -Group instruction provided by verbal instruction, video, and written materials to support subject matter.  Instructors review role of stress in heart disease and how to cope with stress positively.     Exercising on Your Own:  -Group instruction  provided by verbal instruction, power point, and written materials to support subject.  Instructors discuss  benefits of exercise, components of exercise, frequency and intensity of exercise, and end points for exercise.  Also discuss use of nitroglycerin and activating EMS.  Review options of places to exercise outside of rehab.  Review guidelines for sex with heart disease.   Cardiac Drugs I:  -Group instruction provided by verbal instruction and written materials to support subject.  Instructor reviews cardiac drug classes: antiplatelets, anticoagulants, beta blockers, and statins.  Instructor discusses reasons, side effects, and lifestyle considerations for each drug class.   Cardiac Drugs II:  -Group instruction provided by verbal instruction and written materials to support subject.  Instructor reviews cardiac drug classes: angiotensin converting enzyme inhibitors (ACE-I), angiotensin II receptor blockers (ARBs), nitrates, and calcium channel blockers.  Instructor discusses reasons, side effects, and lifestyle considerations for each drug class.   Anatomy and Physiology of the Circulatory System:  -Group instruction provided by verbal instruction, video, and written materials to support subject.  Reviews functional anatomy of heart, how it relates to various diagnoses, and what role the heart plays in the overall system.   Knowledge Questionnaire Score:     Knowledge Questionnaire Score - 06/16/16 1218      Knowledge Questionnaire Score   Pre Score 24/28      Core Components/Risk Factors/Patient Goals at Admission:     Personal Goals and Risk Factors at Admission - 06/16/16 1105      Core Components/Risk Factors/Patient Goals on Admission   Develop more efficient breathing techniques such as purse lipped breathing and diaphragmatic breathing; and practicing self-pacing with activity Yes  improve breathing capacity   Intervention Provide education, demonstration and support about  specific breathing techniuqes utilized for more efficient breathing. Include techniques such as pursed lipped breathing, diaphragmatic breathing and self-pacing activity.   Expected Outcomes Short Term: Participant will be able to demonstrate and use breathing techniques as needed throughout daily activities.   Hypertension Yes   Intervention Provide education on lifestyle modifcations including regular physical activity/exercise, weight management, moderate sodium restriction and increased consumption of fresh fruit, vegetables, and low fat dairy, alcohol moderation, and smoking cessation.;Monitor prescription use compliance.   Expected Outcomes Long Term: Maintenance of blood pressure at goal levels.;Short Term: Continued assessment and intervention until BP is < 140/39mm HG in hypertensive participants. < 130/28mm HG in hypertensive participants with diabetes, heart failure or chronic kidney disease.   Lipids Yes   Intervention Provide education and support for participant on nutrition & aerobic/resistive exercise along with prescribed medications to achieve LDL 70mg , HDL >40mg .   Expected Outcomes Short Term: Participant states understanding of desired cholesterol values and is compliant with medications prescribed. Participant is following exercise prescription and nutrition guidelines.;Long Term: Cholesterol controlled with medications as prescribed, with individualized exercise RX and with personalized nutrition plan. Value goals: LDL < 70mg , HDL > 40 mg.      Core Components/Risk Factors/Patient Goals Review:    Core Components/Risk Factors/Patient Goals at Discharge (Final Review):    ITP Comments:     ITP Comments    Row Name 06/16/16 0816           ITP Comments Medical Director, Dr. Fransico Him          Comments:  Pt in today for Phase II cardiac rehab orientation appt. From 0800-1100.  As a part of the orientation appt., pt completed 5 minutes of warm up stretches and 6  minute walk test.  Pt with elevated bp prior to walk test.  Pt did not take his  medications prior to coming in for this appt. BP did decrease with rest and feet elevation for him to be able to proceed with the walk test.  Pt tolerated well with no complaints or sob.  Monitor showed sr with occ sinus arrhythmias noted.  Brief Psychosocial assessment demonstrates high anxiety regarding financial needs.  Pt will lose his medicaid insurance at the end of the month.  Will look at available resources for this patient to assist him after the end of this month. Will follow closely.  Pt is looking forward to establishing an exercise routine. Cherre Huger, BSN Cardiac and Training and development officer

## 2016-06-16 NOTE — Progress Notes (Signed)
Cardiac Rehab Medication Review by a Pharmacist  Does the patient  feel that his/her medications are working for him/her?  yes  Has the patient been experiencing any side effects to the medications prescribed?  no  Does the patient measure his/her own blood pressure or blood glucose at home?  no   Does the patient have any problems obtaining medications due to transportation or finances?   no  Understanding of regimen: fair Understanding of indications: fair Potential of compliance: fair   Pharmacist comments: Pt presents for initial cardiac rehab appointment - endorses no issues at this time. Pt does not monitor BP regularly - encouraged to check at pharmacy or purchase cuff if possible.   Arrie Senate, PharmD PGY-1 Pharmacy Resident Pager: [phone] 06/16/2016

## 2016-06-22 ENCOUNTER — Encounter (HOSPITAL_COMMUNITY): Payer: Self-pay

## 2016-06-22 ENCOUNTER — Ambulatory Visit (HOSPITAL_COMMUNITY): Payer: Medicaid Other

## 2016-06-22 ENCOUNTER — Telehealth (HOSPITAL_COMMUNITY): Payer: Self-pay | Admitting: Internal Medicine

## 2016-06-23 ENCOUNTER — Telehealth (HOSPITAL_COMMUNITY): Payer: Self-pay | Admitting: *Deleted

## 2016-06-23 NOTE — Telephone Encounter (Signed)
Pt called to let rehab staff know he will not be in on tomorrow. Pt had 6 teeth pulled today and does not feel like exercising on tomorrow.  Pt hopes to begin cardiac rehab on Monday. Cherre Huger, BSN Cardiac and Training and development officer

## 2016-06-24 ENCOUNTER — Inpatient Hospital Stay (HOSPITAL_COMMUNITY): Admission: RE | Admit: 2016-06-24 | Payer: Medicaid Other | Source: Ambulatory Visit

## 2016-06-27 ENCOUNTER — Telehealth (HOSPITAL_COMMUNITY): Payer: Self-pay | Admitting: Internal Medicine

## 2016-06-27 ENCOUNTER — Ambulatory Visit (HOSPITAL_COMMUNITY): Payer: Medicaid Other

## 2016-06-28 ENCOUNTER — Ambulatory Visit (HOSPITAL_COMMUNITY): Payer: Medicaid Other

## 2016-06-29 ENCOUNTER — Ambulatory Visit (HOSPITAL_COMMUNITY): Payer: Medicaid Other

## 2016-07-01 ENCOUNTER — Ambulatory Visit (HOSPITAL_COMMUNITY): Payer: Medicaid Other

## 2016-07-04 ENCOUNTER — Inpatient Hospital Stay (HOSPITAL_COMMUNITY): Admission: RE | Admit: 2016-07-04 | Payer: Medicaid Other | Source: Ambulatory Visit

## 2016-07-04 ENCOUNTER — Ambulatory Visit (HOSPITAL_COMMUNITY): Payer: Medicaid Other

## 2016-07-06 ENCOUNTER — Inpatient Hospital Stay (HOSPITAL_COMMUNITY): Admission: RE | Admit: 2016-07-06 | Payer: Medicaid Other | Source: Ambulatory Visit

## 2016-07-06 ENCOUNTER — Ambulatory Visit (HOSPITAL_COMMUNITY): Payer: Medicaid Other
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
## 2016-07-08 ENCOUNTER — Inpatient Hospital Stay (HOSPITAL_COMMUNITY)
Admission: RE | Admit: 2016-07-08 | Discharge: 2016-07-08 | Disposition: A | Discharge status: Home / Self Care | Payer: Medicaid Other | Source: Ambulatory Visit

## 2016-07-11 ENCOUNTER — Ambulatory Visit (HOSPITAL_COMMUNITY): Payer: Medicaid Other

## 2016-07-12 ENCOUNTER — Telehealth (HOSPITAL_COMMUNITY): Payer: Self-pay | Admitting: *Deleted

## 2016-07-12 NOTE — Telephone Encounter (Signed)
Pt unable to begin full exercise prior to his medicaid coverage ending on 07/11/16.  Cherre Huger, BSN Cardiac and Training and development officer

## 2016-07-13 ENCOUNTER — Ambulatory Visit (HOSPITAL_COMMUNITY): Payer: Medicaid Other

## 2016-07-15 ENCOUNTER — Ambulatory Visit (HOSPITAL_COMMUNITY): Payer: Medicaid Other

## 2016-07-18 ENCOUNTER — Ambulatory Visit (HOSPITAL_COMMUNITY): Payer: Medicaid Other

## 2016-07-20 ENCOUNTER — Ambulatory Visit (HOSPITAL_COMMUNITY): Payer: Medicaid Other

## 2016-07-22 ENCOUNTER — Ambulatory Visit (HOSPITAL_COMMUNITY): Payer: Medicaid Other

## 2016-07-25 ENCOUNTER — Ambulatory Visit (HOSPITAL_COMMUNITY): Payer: Medicaid Other

## 2016-07-27 ENCOUNTER — Ambulatory Visit (HOSPITAL_COMMUNITY): Payer: Medicaid Other

## 2016-07-29 ENCOUNTER — Ambulatory Visit (HOSPITAL_COMMUNITY): Payer: Medicaid Other

## 2016-08-01 ENCOUNTER — Ambulatory Visit (HOSPITAL_COMMUNITY): Payer: Medicaid Other

## 2016-08-03 ENCOUNTER — Ambulatory Visit (HOSPITAL_COMMUNITY): Payer: Medicaid Other

## 2016-08-05 ENCOUNTER — Ambulatory Visit (HOSPITAL_COMMUNITY): Payer: Medicaid Other

## 2016-08-10 ENCOUNTER — Ambulatory Visit (HOSPITAL_COMMUNITY): Payer: Medicaid Other

## 2016-08-12 ENCOUNTER — Ambulatory Visit (HOSPITAL_COMMUNITY): Payer: Medicaid Other

## 2016-08-15 ENCOUNTER — Ambulatory Visit (HOSPITAL_COMMUNITY): Payer: Medicaid Other

## 2016-08-17 ENCOUNTER — Ambulatory Visit (HOSPITAL_COMMUNITY): Payer: Medicaid Other

## 2016-08-19 ENCOUNTER — Ambulatory Visit (HOSPITAL_COMMUNITY): Payer: Medicaid Other

## 2016-08-22 ENCOUNTER — Ambulatory Visit (HOSPITAL_COMMUNITY): Payer: Medicaid Other

## 2016-08-24 ENCOUNTER — Ambulatory Visit (HOSPITAL_COMMUNITY): Payer: Medicaid Other

## 2016-08-26 ENCOUNTER — Ambulatory Visit (HOSPITAL_COMMUNITY): Payer: Medicaid Other

## 2016-08-29 ENCOUNTER — Ambulatory Visit (HOSPITAL_COMMUNITY): Payer: Medicaid Other

## 2016-08-31 ENCOUNTER — Ambulatory Visit (HOSPITAL_COMMUNITY): Payer: Medicaid Other

## 2016-09-02 ENCOUNTER — Ambulatory Visit (HOSPITAL_COMMUNITY): Payer: Medicaid Other

## 2016-09-05 ENCOUNTER — Ambulatory Visit (HOSPITAL_COMMUNITY): Payer: Medicaid Other

## 2016-09-07 ENCOUNTER — Ambulatory Visit (HOSPITAL_COMMUNITY): Payer: Medicaid Other

## 2016-09-09 ENCOUNTER — Ambulatory Visit (HOSPITAL_COMMUNITY): Payer: Medicaid Other

## 2016-09-12 ENCOUNTER — Ambulatory Visit (HOSPITAL_COMMUNITY): Payer: Medicaid Other

## 2016-09-16 ENCOUNTER — Ambulatory Visit (HOSPITAL_COMMUNITY): Payer: Medicaid Other

## 2016-09-19 ENCOUNTER — Ambulatory Visit (HOSPITAL_COMMUNITY): Payer: Medicaid Other

## 2016-09-21 ENCOUNTER — Ambulatory Visit (HOSPITAL_COMMUNITY): Payer: Medicaid Other

## 2017-01-11 ENCOUNTER — Other Ambulatory Visit: Payer: Self-pay | Admitting: Cardiology

## 2017-01-11 DIAGNOSIS — R079 Chest pain, unspecified: Secondary | ICD-10-CM

## 2017-01-18 ENCOUNTER — Ambulatory Visit (HOSPITAL_COMMUNITY)
Admission: RE | Admit: 2017-01-18 | Discharge: 2017-01-18 | Disposition: A | Discharge status: Home / Self Care | Payer: Medicaid Other | Source: Ambulatory Visit | Attending: Cardiology | Admitting: Cardiology

## 2017-01-18 DIAGNOSIS — R079 Chest pain, unspecified: Secondary | ICD-10-CM | POA: Diagnosis not present

## 2017-01-18 MED ORDER — REGADENOSON 0.4 MG/5ML IV SOLN
INTRAVENOUS | Status: AC
  Administered 2017-01-18: 0.4 mg via INTRAVENOUS
  Filled 2017-01-18: qty 5

## 2017-01-18 MED ORDER — TECHNETIUM TC 99M TETROFOSMIN IV KIT: 30.0000 | PACK | Freq: Once | INTRAVENOUS | Status: DC | PRN

## 2017-01-18 MED ORDER — REGADENOSON 0.4 MG/5ML IV SOLN
0.4000 mg | Freq: Once | INTRAVENOUS | Status: AC
  Administered 2017-01-18: 0.4 mg via INTRAVENOUS

## 2017-01-18 MED ORDER — TECHNETIUM TC 99M TETROFOSMIN IV KIT
10.0000 | PACK | Freq: Once | INTRAVENOUS | Status: AC | PRN
  Administered 2017-01-18: 10 via INTRAVENOUS

## 2017-11-06 ENCOUNTER — Telehealth: Payer: Self-pay | Admitting: Hematology

## 2017-11-06 NOTE — Telephone Encounter (Signed)
PT requested to see Dr. Burr Medico for her 1st avail appt

## 2017-11-09 ENCOUNTER — Inpatient Hospital Stay: Payer: Medicare HMO | Attending: Hematology | Admitting: Hematology

## 2017-12-27 ENCOUNTER — Encounter (HOSPITAL_COMMUNITY): Payer: Self-pay

## 2017-12-27 ENCOUNTER — Ambulatory Visit (HOSPITAL_COMMUNITY)
Admission: EM | Admit: 2017-12-27 | Discharge: 2017-12-27 | Disposition: A | Discharge status: Home / Self Care | Payer: Medicare HMO | Attending: Family Medicine | Admitting: Family Medicine

## 2017-12-27 DIAGNOSIS — R350 Frequency of micturition: Secondary | ICD-10-CM | POA: Diagnosis present

## 2017-12-27 DIAGNOSIS — N410 Acute prostatitis: Secondary | ICD-10-CM | POA: Insufficient documentation

## 2017-12-27 DIAGNOSIS — F17210 Nicotine dependence, cigarettes, uncomplicated: Secondary | ICD-10-CM | POA: Insufficient documentation

## 2017-12-27 DIAGNOSIS — R300 Dysuria: Secondary | ICD-10-CM | POA: Diagnosis present

## 2017-12-27 DIAGNOSIS — Z882 Allergy status to sulfonamides status: Secondary | ICD-10-CM | POA: Insufficient documentation

## 2017-12-27 MED ORDER — CIPROFLOXACIN HCL 500 MG PO TABS: 500.0000 mg | ORAL_TABLET | Freq: Two times a day (BID) | ORAL | 0 refills | Status: AC

## 2017-12-27 NOTE — ED Notes (Signed)
Urinalysis testing exceeded the limitations of the [employer] capabilities.

## 2017-12-27 NOTE — Discharge Instructions (Addendum)
I believe that you have prostatitis We will give you 4 more weeks of ciprofloxacin since you have already had 1 week of medication.  It can take 6 weeks before symptoms completely go away. If you are not better after this treatment or become worse please return here for follow-up, to your primary care provider or for severe symptoms go to the ER.

## 2017-12-27 NOTE — ED Provider Notes (Signed)
[place]    CSN: [id number] Arrival date & time: 12/27/17  0847     History   Chief Complaint Chief Complaint  Patient presents with  . Urinary Tract Infection    HPI [name] [name] is a 67 y.o. male.   Patient is a 67 year old male past medical history of cancer, CAD, hepatitis, hypertension, osteoarthritis.  Presents with 2 weeks of dysuria, urinary frequency, post void dribbling, suprapubic pressure, pressure in testicles without swelling, lower back pain and left flank pain.  His symptoms have somewhat improved but still remain.  He was seen by his physician 2 weeks ago when his symptoms started and treated for urinary tract infection with ciprofloxacin x7 days.  He is concerned because his symptoms still remain. He denies any fever, chills body aches, fatigue.  He denies any history of prostate issues.  He denies being currently sexually active or any concern for STDs.  ROS per HPI      Past Medical History:  Diagnosis Date  . Anginal pain ([place])   . Cancer ([place])    colon  . Coronary artery disease   . Hepatitis    c  . Hypertension   . Osteoarthritis of left knee 06/21/2011  . Shortness of breath     Patient Active Problem List   Diagnosis Date Noted  . Unstable angina ([place]) 12/31/2015  . History of colon cancer, stage II 10/30/2014  . ACS (acute coronary syndrome) ([place]) 08/02/2013  . Urinary frequency 10/19/2011  . Acute blood loss anemia 06/22/2011  . Hyponatremia 06/22/2011  . Osteoarthritis of left knee 06/21/2011  . Primary osteoarthritis of left knee 01/17/2011  . CERUMEN IMPACTION, RIGHT 08/19/2009  . SEBACEOUS CYST, INFECTED 10/01/2007  . CARBUNCLE AND FURUNCLE OF TRUNK 09/27/2007  . NUMBNESS 09/27/2007  . MUSCLE SPASM, TRAPEZIUS MUSCLE, RIGHT 09/05/2007  . Acute hepatitis C virus infection 05/29/2007  . HYPERLIPIDEMIA 05/29/2007  . HYPERTENSION 05/29/2007  . HYPERTENSION, BENIGN 05/02/2007  . CORONARY ARTERY DISEASE 03/14/2001      Past Surgical History:  Procedure Laterality Date  . CARDIAC CATHETERIZATION  12/31/2015  . CARDIAC CATHETERIZATION N/A 12/31/2015   Procedure: Left Heart Cath and Coronary Angiography;  Surgeon: Charolette Forward, MD;  Location: [facility];  Service: Cardiovascular;  Laterality: N/A;  . CARDIAC CATHETERIZATION N/A 12/31/2015   Procedure: Coronary Balloon Angioplasty;  Surgeon: Charolette Forward, MD;  Location: [facility] [facility];  Service: Cardiovascular;  Laterality: N/A;  . COLON SURGERY     2002   colon  ca   dr Bubba Camp      chemo  tx  . JOINT REPLACEMENT     rt knee  . LEFT HEART CATHETERIZATION WITH CORONARY ANGIOGRAM N/A 11/24/2011   Procedure: LEFT HEART CATHETERIZATION WITH CORONARY ANGIOGRAM;  Surgeon: Clent Demark, MD;  Location: [facility] CATH LAB;  Service: Cardiovascular;  Laterality: N/A;  . PERCUTANEOUS CORONARY STENT INTERVENTION (PCI-S)  11/24/2011   Procedure: PERCUTANEOUS CORONARY STENT INTERVENTION (PCI-S);  Surgeon: Clent Demark, MD;  Location: [facility] C[facility] LAB;  Service: Cardiovascular;;  . REPLACEMENT TOTAL KNEE     bilateral knee replacements       Home Medications    Prior to Admission medications   Medication Sig Start Date End Date Taking? Authorizing Provider  albuterol (PROVENTIL HFA;VENTOLIN HFA) 108 (90 Base) MCG/ACT inhaler Inhale 1-2 puffs into the lungs every 6 (six) hours as needed for wheezing or shortness of breath.    [provider]  aspirin EC  81 MG EC tablet Take 1 tablet (81 mg total) by mouth daily. 08/04/13   Dixie Dials, MD  atorvastatin (LIPITOR) 40 MG tablet Take 1 tablet (40 mg total) by mouth daily at 6 PM. 01/01/16   Charolette Forward, MD  ciprofloxacin (CIPRO) 500 MG tablet Take 1 tablet (500 mg total) by mouth 2 (two) times daily for 28 days. 12/27/17 01/24/18  Loura Halt A, NP  losartan (COZAAR) 50 MG tablet Take 1 tablet (50 mg total) by mouth daily. 01/01/16   Charolette Forward, MD  metoprolol succinate (TOPROL-XL) 25 MG 24 hr  tablet Take 12.5 mg by mouth daily.     [provider]  nitroGLYCERIN (NITROSTAT) 0.4 MG SL tablet Place 1 tablet (0.4 mg total) under the tongue every 5 (five) minutes as needed for chest pain. 01/01/16   Charolette Forward, MD  ticagrelor (BRILINTA) 90 MG TABS tablet Take 1 tablet (90 mg total) by mouth 2 (two) times daily. 01/01/16   Charolette Forward, MD    Family History Family History  Problem Relation Age of Onset  . Hypertension Mother   . Colon cancer Neg Hx     Social History Social History   Tobacco Use  . Smoking status: Current Every Day Smoker    Packs/day: 0.25    Years: 7.00    Pack years: 1.75    Types: Cigarettes  . Smokeless tobacco: Never Used  Substance Use Topics  . Alcohol use: Yes    Alcohol/week: 4.0 standard drinks    Types: 4 Cans of beer per week    Comment: oct   2012  . Drug use: No     Allergies   Acyclovir and related and Sulfonamide derivatives   Review of Systems Review of Systems   Physical Exam Triage Vital Signs ED Triage Vitals  Enc Vitals Group     BP 12/27/17 0924 129/90     Pulse Rate 12/27/17 0924 (!) 59     Resp 12/27/17 0924 16     Temp 12/27/17 0924 97.9 F (36.6 C)     Temp Source 12/27/17 0924 Oral     SpO2 12/27/17 0924 97 %     Weight --      Height --      Head Circumference --      Peak Flow --      Pain Score 12/27/17 0933 5     Pain Loc --      Pain Edu? --      Excl. in [place]? --    No data found.  Updated Vital Signs BP 129/90 (BP Location: Left Arm)   Pulse (!) 59   Temp 97.9 F (36.6 C) (Oral)   Resp 16   SpO2 97%   Visual Acuity Right Eye Distance:   Left Eye Distance:   Bilateral Distance:    Right Eye Near:   Left Eye Near:    Bilateral Near:     Physical Exam  Constitutional: He is oriented to person, place, and time. He appears well-developed and well-nourished.  Very pleasant. Non toxic or ill appearing.   HENT:  Head: Normocephalic and atraumatic.  Eyes: Conjunctivae are  normal.  Neck: Normal range of motion.  Pulmonary/Chest: Effort normal.  Abdominal: Soft. Bowel sounds are normal.  Abdomen soft, non tender. No CVA tenderness. No rebound tenderness. Mild suprapubic pressure.   Musculoskeletal: Normal range of motion.  Neurological: He is alert and oriented to person, place, and time.  Skin: Skin is  warm and dry.  Psychiatric: He has a normal mood and affect.  Nursing note and vitals reviewed.    UC Treatments / Results  Labs (all labs ordered are listed, but only abnormal results are displayed) Labs Reviewed  URINE CULTURE    EKG None  Radiology No results found.  Procedures Procedures (including critical care time)  Medications Ordered in UC Medications - No data to display  Initial Impression / Assessment and Plan / UC Course  I have reviewed the triage vital signs and the nursing notes.  Pertinent labs & imaging results that were available during my care of the patient were reviewed by me and considered in my medical decision making (see chart for details).     Urine unable to be read on the machine here do to high level of leuks There was trace blood and negative for nitrites, ketones and glucose was negative.  Acute prostatitis-  Most likely patient's symptoms are related to prostatitis. He was treated with 7 days of ciprofloxacin for a UTI.His symptoms still remain.  Recommended treatment for prostatitis is a 4 to 6 weeks of antibiotics to eradicate the bacteria and penetrate the prostate. He has already had 1 week of antibiotics. Patient vital signs stable, he is nontoxic or ill-appearing We will continue with 4 more weeks of antibiotics Instructed to follow with his primary care physician between now and then to recheck symptoms Also instructed to follow-up sooner for continued or worsening symptoms despite taking the ciprofloxacin. Patient understanding and agreeable to plan  Final Clinical Impressions(s) / UC Diagnoses     Final diagnoses:  Acute prostatitis     Discharge Instructions     I believe that you have prostatitis We will give you 4 more weeks of ciprofloxacin since you have already had 1 week of medication.  It can take 6 weeks before symptoms completely go away. If you are not better after this treatment or become worse please return here for follow-up, to your primary care provider or for severe symptoms go to the ER.     ED Prescriptions    Medication Sig Dispense Auth. Provider   ciprofloxacin (CIPRO) 500 MG tablet Take 1 tablet (500 mg total) by mouth 2 (two) times daily for 28 days. 56 tablet Loura Halt A, NP     Controlled Substance Prescriptions  Controlled Substance Registry consulted? Not Applicable   Orvan July, NP 12/27/17 1056

## 2017-12-27 NOTE — ED Triage Notes (Signed)
Pt presents with still having urinary tract symptoms after being treated by primary doctor for UTI.Pt states he is still having burning sensation.

## 2017-12-28 LAB — URINE CULTURE: CULTURE: NO GROWTH

## 2018-04-06 ENCOUNTER — Telehealth: Payer: Self-pay

## 2018-04-06 NOTE — Telephone Encounter (Signed)
  Oncology Nurse Navigator Documentation   Patient mailed letter to [facility] requesting a follow-up visit. Dr. Burr Medico reviewed the letter and requested that I call to let patient know that his cancer was in 2022 and that patient should call his PCP to schedule apt. I called patient to relay information. Patient has no immediate concerns just wanted "a check up." Patient stated that he does have a PCP and will call for an appointment.

## 2018-04-19 ENCOUNTER — Encounter: Payer: Self-pay | Admitting: Gastroenterology

## 2018-04-23 ENCOUNTER — Encounter: Payer: Self-pay | Admitting: Gastroenterology

## 2018-04-24 DIAGNOSIS — C801 Malignant (primary) neoplasm, unspecified: Secondary | ICD-10-CM | POA: Insufficient documentation

## 2018-05-02 ENCOUNTER — Other Ambulatory Visit: Payer: Self-pay

## 2018-05-02 ENCOUNTER — Other Ambulatory Visit (HOSPITAL_COMMUNITY): Payer: Self-pay | Admitting: Cardiology

## 2018-05-02 ENCOUNTER — Ambulatory Visit (AMBULATORY_SURGERY_CENTER): Payer: Self-pay

## 2018-05-02 VITALS — Ht 71.0 in | Wt 219.6 lb

## 2018-05-02 DIAGNOSIS — Z85038 Personal history of other malignant neoplasm of large intestine: Secondary | ICD-10-CM

## 2018-05-02 DIAGNOSIS — R079 Chest pain, unspecified: Secondary | ICD-10-CM

## 2018-05-02 DIAGNOSIS — C801 Malignant (primary) neoplasm, unspecified: Secondary | ICD-10-CM

## 2018-05-02 NOTE — Progress Notes (Signed)
No egg or soy allergy known to patient  No issues with past sedation with any surgeries  or procedures, no intubation problems  No diet pills per patient No home 02 use per patient  No blood thinners per patient  Pt denies issues with constipation  No A fib or A flutter  EMMI video sent to pt's e mail , pt declined    

## 2018-05-03 ENCOUNTER — Encounter: Payer: Self-pay | Admitting: Gastroenterology

## 2018-05-07 ENCOUNTER — Ambulatory Visit (HOSPITAL_COMMUNITY)
Admission: RE | Admit: 2018-05-07 | Discharge: 2018-05-07 | Disposition: A | Discharge status: Home / Self Care | Payer: Medicare HMO | Source: Ambulatory Visit | Attending: Cardiology | Admitting: Cardiology

## 2018-05-07 DIAGNOSIS — R079 Chest pain, unspecified: Secondary | ICD-10-CM | POA: Insufficient documentation

## 2018-05-07 MED ORDER — REGADENOSON 0.4 MG/5ML IV SOLN
0.4000 mg | Freq: Once | INTRAVENOUS | Status: AC
  Administered 2018-05-07: 0.4 mg via INTRAVENOUS

## 2018-05-07 MED ORDER — REGADENOSON 0.4 MG/5ML IV SOLN
INTRAVENOUS | Status: AC
  Administered 2018-05-07: 0.4 mg via INTRAVENOUS
  Filled 2018-05-07: qty 5

## 2018-05-07 MED ORDER — TECHNETIUM TC 99M TETROFOSMIN IV KIT
30.0000 | PACK | Freq: Once | INTRAVENOUS | Status: AC | PRN
  Administered 2018-05-07: 30 via INTRAVENOUS

## 2018-05-07 MED ORDER — TECHNETIUM TC 99M TETROFOSMIN IV KIT
10.0000 | PACK | Freq: Once | INTRAVENOUS | Status: AC | PRN
  Administered 2018-05-07: 10 via INTRAVENOUS

## 2018-05-15 ENCOUNTER — Telehealth: Payer: Self-pay | Admitting: Gastroenterology

## 2018-05-15 NOTE — Telephone Encounter (Signed)
Hi Dr. Tarri Glenn, this pt just cancelled his colonoscopy that was scheduled for tomorrow with you. He had a stress test with his cardiologist that showed a blockage in his heart so he needs to have a stent placed. He will call back to reschedule once he is clear for procedure. Thank you.

## 2018-05-15 NOTE — Telephone Encounter (Signed)
Sounds appropriate to cancel. No charge. Thank you.

## 2018-05-16 ENCOUNTER — Encounter: Payer: Medicare HMO | Admitting: Gastroenterology

## 2018-09-26 ENCOUNTER — Other Ambulatory Visit: Payer: Self-pay

## 2018-09-26 DIAGNOSIS — Z20822 Contact with and (suspected) exposure to covid-19: Secondary | ICD-10-CM

## 2018-09-30 LAB — NOVEL CORONAVIRUS, NAA: SARS-CoV-2, NAA: NOT DETECTED

## 2019-02-28 ENCOUNTER — Other Ambulatory Visit: Payer: Self-pay | Admitting: Cardiology

## 2019-02-28 DIAGNOSIS — Z20822 Contact with and (suspected) exposure to covid-19: Secondary | ICD-10-CM

## 2019-03-01 LAB — NOVEL CORONAVIRUS, NAA: SARS-CoV-2, NAA: NOT DETECTED

## 2019-03-04 ENCOUNTER — Telehealth: Payer: Self-pay

## 2019-03-04 NOTE — Telephone Encounter (Signed)
Caller given negative result and verbalized understanding  

## 2019-03-28 ENCOUNTER — Other Ambulatory Visit: Payer: Self-pay

## 2019-03-28 DIAGNOSIS — Z20822 Contact with and (suspected) exposure to covid-19: Secondary | ICD-10-CM

## 2019-03-29 LAB — NOVEL CORONAVIRUS, NAA: SARS-CoV-2, NAA: NOT DETECTED

## 2019-03-30 ENCOUNTER — Telehealth: Payer: Self-pay | Admitting: Hematology

## 2019-03-30 NOTE — Telephone Encounter (Signed)
Pt is aware covid 19 test is neg on 03-30-2019

## 2019-07-22 ENCOUNTER — Encounter (HOSPITAL_COMMUNITY): Payer: Self-pay | Admitting: Emergency Medicine

## 2019-07-22 ENCOUNTER — Other Ambulatory Visit: Payer: Self-pay

## 2019-07-22 ENCOUNTER — Inpatient Hospital Stay (HOSPITAL_COMMUNITY)
Admission: EM | Admit: 2019-07-22 | Discharge: 2019-07-26 | DRG: 287 | Disposition: A | Discharge status: Home / Self Care | Payer: Medicare Other | Attending: Cardiovascular Disease | Admitting: Cardiovascular Disease

## 2019-07-22 ENCOUNTER — Emergency Department (HOSPITAL_COMMUNITY): Payer: Medicare Other

## 2019-07-22 DIAGNOSIS — Z8249 Family history of ischemic heart disease and other diseases of the circulatory system: Secondary | ICD-10-CM

## 2019-07-22 DIAGNOSIS — I200 Unstable angina: Secondary | ICD-10-CM | POA: Diagnosis present

## 2019-07-22 DIAGNOSIS — Z8619 Personal history of other infectious and parasitic diseases: Secondary | ICD-10-CM | POA: Diagnosis not present

## 2019-07-22 DIAGNOSIS — Z7902 Long term (current) use of antithrombotics/antiplatelets: Secondary | ICD-10-CM | POA: Diagnosis not present

## 2019-07-22 DIAGNOSIS — N1831 Chronic kidney disease, stage 3a: Secondary | ICD-10-CM | POA: Diagnosis present

## 2019-07-22 DIAGNOSIS — E860 Dehydration: Secondary | ICD-10-CM | POA: Diagnosis not present

## 2019-07-22 DIAGNOSIS — E785 Hyperlipidemia, unspecified: Secondary | ICD-10-CM | POA: Diagnosis present

## 2019-07-22 DIAGNOSIS — I129 Hypertensive chronic kidney disease with stage 1 through stage 4 chronic kidney disease, or unspecified chronic kidney disease: Secondary | ICD-10-CM | POA: Diagnosis present

## 2019-07-22 DIAGNOSIS — Z85038 Personal history of other malignant neoplasm of large intestine: Secondary | ICD-10-CM | POA: Diagnosis not present

## 2019-07-22 DIAGNOSIS — Z20822 Contact with and (suspected) exposure to covid-19: Secondary | ICD-10-CM | POA: Diagnosis present

## 2019-07-22 DIAGNOSIS — Z881 Allergy status to other antibiotic agents status: Secondary | ICD-10-CM

## 2019-07-22 DIAGNOSIS — Z96653 Presence of artificial knee joint, bilateral: Secondary | ICD-10-CM | POA: Diagnosis present

## 2019-07-22 DIAGNOSIS — I25118 Atherosclerotic heart disease of native coronary artery with other forms of angina pectoris: Secondary | ICD-10-CM | POA: Diagnosis present

## 2019-07-22 DIAGNOSIS — Z882 Allergy status to sulfonamides status: Secondary | ICD-10-CM

## 2019-07-22 DIAGNOSIS — I249 Acute ischemic heart disease, unspecified: Secondary | ICD-10-CM | POA: Diagnosis present

## 2019-07-22 DIAGNOSIS — F17210 Nicotine dependence, cigarettes, uncomplicated: Secondary | ICD-10-CM | POA: Diagnosis present

## 2019-07-22 DIAGNOSIS — Z7982 Long term (current) use of aspirin: Secondary | ICD-10-CM | POA: Diagnosis not present

## 2019-07-22 LAB — CBC
HCT: 44.4 % (ref 39.0–52.0)
Hemoglobin: 14.2 g/dL (ref 13.0–17.0)
MCH: 31.6 pg (ref 26.0–34.0)
MCHC: 32 g/dL (ref 30.0–36.0)
MCV: 98.9 fL (ref 80.0–100.0)
Platelets: 222 10*3/uL (ref 150–400)
RBC: 4.49 MIL/uL (ref 4.22–5.81)
RDW: 14 % (ref 11.5–15.5)
WBC: 8.3 10*3/uL (ref 4.0–10.5)
nRBC: 0 % (ref 0.0–0.2)

## 2019-07-22 LAB — BASIC METABOLIC PANEL
Anion gap: 9 (ref 5–15)
BUN: 17 mg/dL (ref 8–23)
CO2: 20 mmol/L — ABNORMAL LOW (ref 22–32)
Calcium: 9.4 mg/dL (ref 8.9–10.3)
Chloride: 110 mmol/L (ref 98–111)
Creatinine, Ser: 2.03 mg/dL — ABNORMAL HIGH (ref 0.61–1.24)
GFR calc Af Amer: 38 mL/min — ABNORMAL LOW (ref >60)
GFR calc non Af Amer: 33 mL/min — ABNORMAL LOW (ref >60)
Glucose, Bld: 122 mg/dL — ABNORMAL HIGH (ref 70–99)
Potassium: 4.2 mmol/L (ref 3.5–5.1)
Sodium: 139 mmol/L (ref 135–145)

## 2019-07-22 LAB — TROPONIN I (HIGH SENSITIVITY)
Troponin I (High Sensitivity): 7 ng/L (ref <18)
Troponin I (High Sensitivity): 7 ng/L (ref <18)

## 2019-07-22 LAB — SARS CORONAVIRUS 2 BY RT PCR (HOSPITAL ORDER, PERFORMED IN ~~LOC~~ HOSPITAL LAB): SARS Coronavirus 2: NEGATIVE

## 2019-07-22 MED ORDER — METOPROLOL SUCCINATE ER 50 MG PO TB24
50.0000 mg | ORAL_TABLET | Freq: Every day | ORAL | Status: DC
  Filled 2019-07-22: qty 1

## 2019-07-22 MED ORDER — SODIUM CHLORIDE 0.9 % IV BOLUS
500.0000 mL | Freq: Once | INTRAVENOUS | Status: AC
  Administered 2019-07-22: 15:00:00 500 mL via INTRAVENOUS

## 2019-07-22 MED ORDER — ALBUTEROL SULFATE HFA 108 (90 BASE) MCG/ACT IN AERS
1.0000 | INHALATION_SPRAY | Freq: Four times a day (QID) | RESPIRATORY_TRACT | Status: DC | PRN
  Filled 2019-07-22: qty 6.7

## 2019-07-22 MED ORDER — ONDANSETRON HCL 4 MG/2ML IJ SOLN: 4.0000 mg | Freq: Four times a day (QID) | INTRAMUSCULAR | Status: DC | PRN

## 2019-07-22 MED ORDER — NITROGLYCERIN 0.4 MG SL SUBL
0.4000 mg | SUBLINGUAL_TABLET | SUBLINGUAL | Status: DC | PRN
  Administered 2019-07-25: 0.4 mg via SUBLINGUAL
  Filled 2019-07-22: qty 1

## 2019-07-22 MED ORDER — CLOPIDOGREL BISULFATE 75 MG PO TABS
75.0000 mg | ORAL_TABLET | Freq: Every day | ORAL | Status: DC
  Administered 2019-07-23 – 2019-07-26 (×4): 75 mg via ORAL
  Filled 2019-07-22 (×5): qty 1

## 2019-07-22 MED ORDER — HEPARIN BOLUS VIA INFUSION
4000.0000 [IU] | Freq: Once | INTRAVENOUS | Status: AC
  Administered 2019-07-22: 4000 [IU] via INTRAVENOUS
  Filled 2019-07-22: qty 4000

## 2019-07-22 MED ORDER — HEPARIN (PORCINE) 25000 UT/250ML-% IV SOLN
1200.0000 [IU]/h | INTRAVENOUS | Status: DC
  Administered 2019-07-22 – 2019-07-24 (×3): 1200 [IU]/h via INTRAVENOUS
  Filled 2019-07-22 (×3): qty 250

## 2019-07-22 MED ORDER — SPIRONOLACTONE 25 MG PO TABS
25.0000 mg | ORAL_TABLET | Freq: Every day | ORAL | Status: DC
  Administered 2019-07-23 – 2019-07-25 (×3): 25 mg via ORAL
  Filled 2019-07-22 (×4): qty 1

## 2019-07-22 MED ORDER — SODIUM CHLORIDE 0.9 % IV BOLUS
500.0000 mL | Freq: Once | INTRAVENOUS | Status: AC
  Administered 2019-07-22: 500 mL via INTRAVENOUS

## 2019-07-22 MED ORDER — SACUBITRIL-VALSARTAN 49-51 MG PO TABS
1.0000 | ORAL_TABLET | Freq: Two times a day (BID) | ORAL | Status: DC
  Administered 2019-07-22 – 2019-07-25 (×6): 1 via ORAL
  Filled 2019-07-22 (×6): qty 1

## 2019-07-22 MED ORDER — ASPIRIN EC 81 MG PO TBEC
81.0000 mg | DELAYED_RELEASE_TABLET | Freq: Every day | ORAL | Status: DC
  Administered 2019-07-23 – 2019-07-26 (×4): 81 mg via ORAL
  Filled 2019-07-22 (×4): qty 1

## 2019-07-22 MED ORDER — ACETAMINOPHEN 325 MG PO TABS: 650.0000 mg | ORAL_TABLET | ORAL | Status: DC | PRN

## 2019-07-22 MED ORDER — ATORVASTATIN CALCIUM 80 MG PO TABS
80.0000 mg | ORAL_TABLET | Freq: Every day | ORAL | Status: DC
  Administered 2019-07-23 – 2019-07-26 (×4): 80 mg via ORAL
  Filled 2019-07-22 (×4): qty 1

## 2019-07-22 MED ORDER — SODIUM CHLORIDE 0.9% FLUSH
3.0000 mL | Freq: Once | INTRAVENOUS | Status: AC
  Administered 2019-07-22: 14:00:00 3 mL via INTRAVENOUS

## 2019-07-22 MED ORDER — NITROGLYCERIN 0.4 MG/HR TD PT24
0.4000 mg | MEDICATED_PATCH | Freq: Every day | TRANSDERMAL | Status: DC
  Administered 2019-07-23 – 2019-07-26 (×4): 0.4 mg via TRANSDERMAL
  Filled 2019-07-22 (×4): qty 1

## 2019-07-22 NOTE — Progress Notes (Signed)
ANTICOAGULATION CONSULT NOTE - Initial Consult  Pharmacy Consult for heparin Indication: chest pain/ACS  Allergies  Allergen Reactions  . Acyclovir And Related   . Sulfonamide Derivatives Rash and Other (See Comments)    REACTION: penile irritation    Patient Measurements: Height: 5\' 11"  (180.3 cm) Weight: 97.5 kg (215 lb) IBW/kg (Calculated) : 75.3 Heparin Dosing Weight: 97 kg   Vital Signs: Temp: 98 F (36.7 C) (05/10 1115) Temp Source: Oral (05/10 1115) BP: 147/83 (05/10 1645) Pulse Rate: 51 (05/10 1545)  Labs: Recent Labs    07/22/19 1132  HGB 14.2  HCT 44.4  PLT 222  CREATININE 2.03*  TROPONINIHS 7    Estimated Creatinine Clearance: 42.1 mL/min (A) (by C-G formula based on SCr of 2.03 mg/dL (H)).   Medical History: Past Medical History:  Diagnosis Date  . Anginal pain ([place])   . Cancer ([place])    colon  . Coronary artery disease   . Hepatitis    c  . Hyperlipidemia   . Hypertension   . Osteoarthritis of left knee 06/21/2011  . Shortness of breath     Medications:  (Not in a hospital admission)   Assessment: 74 YOM with chest pain to start IV heparin for ACS. H/H and Plt wnl.   Goal of Therapy:  Heparin level 0.3-0.7 units/ml Monitor platelets by anticoagulation protocol: Yes   Plan:  -Heparin 4000 units IV bolus followed by heparin infusion at 1200 units/hr -F/u 6 hr HL -Monitor daily HL, CBC and s/s of bleeding   Albertina Parr, PharmD., BCPS, BCCCP Clinical Pharmacist Clinical phone for 07/22/19 until 11:30pm: [phone] If after 11:30pm, please refer to [facility] for unit-specific pharmacist

## 2019-07-22 NOTE — ED Triage Notes (Signed)
Pt states yesterday at church he noticed at 11am he was having some blurry vision that seemed to come and go- pt states about 1 hour ago he was sitting in a chair with a friend when he suddenly started having cp and then had a syncope event and fell out of chair onto floor. Pt currently has no pain.

## 2019-07-22 NOTE — ED Notes (Signed)
Dinner ordered 

## 2019-07-22 NOTE — ED Provider Notes (Signed)
[facility] EMERGENCY DEPARTMENT Provider Note   CSN: [id number] Arrival date & time: 07/22/19  1106     History Chief Complaint  Patient presents with  . Chest Pain    [name] [name] is a 69 y.o. male.  HPI Patient with history of angina, managed by Dr. Terrence Dupont who reports he has had more frequent episodes of chest pain and has used his NTG more often in the last week. 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 He felt 'funny' after using the NTG but his pain improved. He was weak and unable to continue painting so he sat down on a step stool then fell over. He denies LOC, did not pass all the way out. He reports that Dr. Terrence Dupont has told him that he wanted to do a stent but couldn't due to his kidney function. He is unsure what his baseline Cr is.     Past Medical History:  Diagnosis Date  . Anginal pain ([place])   . Cancer ([place])    colon  . Coronary artery disease   . Hepatitis    c  . Hyperlipidemia   . Hypertension   . Osteoarthritis of left knee 06/21/2011  . Shortness of breath     Patient Active Problem List   Diagnosis Date Noted  . Acute coronary syndrome ([place]) 07/22/2019  . Cancer ([place])   . Unstable angina ([place]) 12/31/2015  . History of colon cancer, stage II 10/30/2014  . ACS (acute coronary syndrome) ([place]) 08/02/2013  . Urinary frequency 10/19/2011  . Acute blood loss anemia 06/22/2011  . Hyponatremia 06/22/2011  . Osteoarthritis of left knee 06/21/2011  . Primary osteoarthritis of left knee 01/17/2011  . CERUMEN IMPACTION, RIGHT 08/19/2009  . SEBACEOUS CYST, INFECTED 10/01/2007  . CARBUNCLE AND FURUNCLE OF TRUNK 09/27/2007  . NUMBNESS 09/27/2007  . MUSCLE SPASM, TRAPEZIUS MUSCLE, RIGHT 09/05/2007  . Acute hepatitis C virus infection 05/29/2007  . HYPERLIPIDEMIA 05/29/2007  . HYPERTENSION 05/29/2007  . HYPERTENSION, BENIGN 05/02/2007  . CORONARY ARTERY DISEASE 03/14/2001    Past  Surgical History:  Procedure Laterality Date  . CARDIAC CATHETERIZATION  12/31/2015  . CARDIAC CATHETERIZATION N/A 12/31/2015   Procedure: Left Heart Cath and Coronary Angiography;  Surgeon: Charolette Forward, MD;  Location: [facility] CV LAB;  Service: Cardiovascular;  Laterality: N/A;  . CARDIAC CATHETERIZATION N/A 12/31/2015   Procedure: Coronary Balloon Angioplasty;  Surgeon: Charolette Forward, MD;  Location: [facility] CV LAB;  Service: Cardiovascular;  Laterality: N/A;  . COLON SURGERY     2002   colon  ca   dr Bubba Camp      chemo  tx  . JOINT REPLACEMENT     rt knee  . LEFT HEART CATHETERIZATION WITH CORONARY ANGIOGRAM N/A 11/24/2011   Procedure: LEFT HEART CATHETERIZATION WITH CORONARY ANGIOGRAM;  Surgeon: Clent Demark, MD;  Location: [facility] CATH LAB;  Service: Cardiovascular;  Laterality: N/A;  . PERCUTANEOUS CORONARY STENT INTERVENTION (PCI-S)  11/24/2011   Procedure: PERCUTANEOUS CORONARY STENT INTERVENTION (PCI-S);  Surgeon: Clent Demark, MD;  Location: [facility] CATH LAB;  Service: Cardiovascular;;  . REPLACEMENT TOTAL KNEE     bilateral knee replacements       Family History  Problem Relation Age of Onset  . Hypertension Mother   . Colon cancer Neg Hx   . Esophageal cancer Neg Hx   . Rectal cancer Neg Hx   . Stomach cancer Neg Hx  Social History   Tobacco Use  . Smoking status: Current Every Day Smoker    Packs/day: 0.25    Years: 7.00    Pack years: 1.75    Types: Cigarettes  . Smokeless tobacco: Never Used  Substance Use Topics  . Alcohol use: Yes    Alcohol/week: 4.0 standard drinks    Types: 4 Cans of beer per week    Comment: oct   2012  . Drug use: No    Home Medications Prior to Admission medications   Medication Sig Start Date End Date Taking? Authorizing Provider  albuterol (PROVENTIL HFA;VENTOLIN HFA) 108 (90 Base) MCG/ACT inhaler Inhale 1-2 puffs into the lungs every 6 (six) hours as needed for wheezing or shortness of breath.   Yes [provider]  aspirin EC 81 MG EC tablet Take 1 tablet (81 mg total) by mouth daily. 08/04/13  Yes Dixie Dials, MD  atorvastatin (LIPITOR) 80 MG tablet Take 80 mg by mouth daily. 07/08/19  Yes [provider]  clopidogrel (PLAVIX) 75 MG tablet Take 75 mg by mouth daily. 07/08/19  Yes [provider]  ENTRESTO 49-51 MG Take 1 tablet by mouth in the morning and at bedtime. 07/08/19  Yes [provider]  metoprolol succinate (TOPROL-XL) 50 MG 24 hr tablet Take 50 mg by mouth daily. 07/09/19  Yes [provider]  nitroGLYCERIN (NITRODUR - DOSED IN MG/24 HR) 0.4 mg/hr patch Place 0.4 mg onto the skin daily. 07/08/19  Yes [provider]  nitroGLYCERIN (NITROSTAT) 0.4 MG SL tablet Place 1 tablet (0.4 mg total) under the tongue every 5 (five) minutes as needed for chest pain. 01/01/16  Yes Charolette Forward, MD  spironolactone (ALDACTONE) 25 MG tablet Take 25 mg by mouth daily. 07/08/19  Yes [provider]    Allergies    Acyclovir and related and Sulfonamide derivatives  Review of Systems   Review of Systems A comprehensive review of systems was completed and negative except as noted in HPI.   Physical Exam Updated Vital Signs BP 124/75   Pulse (!) 51   Temp 98 F (36.7 C) (Oral)   Resp (!) 23   Ht 5\' 11"  (1.803 m)   Wt 97.5 kg   SpO2 98%   BMI 29.99 kg/m   Physical Exam Vitals and nursing note reviewed.  Constitutional:      Appearance: Normal appearance.  HENT:     Head: Normocephalic and atraumatic.     Nose: Nose normal.     Mouth/Throat:     Mouth: Mucous membranes are moist.  Eyes:     Extraocular Movements: Extraocular movements intact.     Conjunctiva/sclera: Conjunctivae normal.  Cardiovascular:     Rate and Rhythm: Normal rate.  Pulmonary:     Effort: Pulmonary effort is normal.     Breath sounds: Normal breath sounds.  Abdominal:     General: Abdomen is flat.     Palpations: Abdomen is soft.     Tenderness: There is no  abdominal tenderness.  Musculoskeletal:        General: No swelling. Normal range of motion.     Cervical back: Neck supple.  Skin:    General: Skin is warm and dry.  Neurological:     General: No focal deficit present.     Mental Status: He is alert.  Psychiatric:        Mood and Affect: Mood normal.     ED Results / Procedures / Treatments  Labs (all labs ordered are listed, but only abnormal results are displayed) Labs Reviewed  BASIC METABOLIC PANEL - Abnormal; Notable for the following components:      Result Value   CO2 20 (*)    Glucose, Bld 122 (*)    Creatinine, Ser 2.03 (*)    GFR calc non Af Amer 33 (*)    GFR calc Af Amer 38 (*)    All other components within normal limits  SARS CORONAVIRUS 2 BY RT PCR (HOSPITAL ORDER, [facility])  CBC  HIV ANTIBODY (ROUTINE TESTING W REFLEX)  BASIC METABOLIC PANEL  LIPID PANEL  CBC  PROTIME-INR  HEPARIN LEVEL (UNFRACTIONATED)  TROPONIN I (HIGH SENSITIVITY)  TROPONIN I (HIGH SENSITIVITY)    EKG EKG Interpretation  Date/Time:  Monday Jul 22 2019 11:19:27 EDT Ventricular Rate:  62 PR Interval:  166 QRS Duration: 74 QT Interval:  404 QTC Calculation: 410 R Axis:   48 Text Interpretation: Normal sinus rhythm Cannot rule out Anterior infarct , age undetermined Abnormal ECG No significant change since last tracing Confirmed by Calvert Cantor [phone]) on 07/22/2019 1:33:14 PM   Radiology DG Chest 2 View  Result Date: 07/22/2019 CLINICAL DATA:  Chest pain. Additional provided: Patient reports severe in sing intermittent chest pain with shortness of breath and sweating, dizziness, syncope, blurred vision yesterday, former smoker. EXAM: CHEST - 2 VIEW COMPARISON:  Chest radiograph 08/02/2013 FINDINGS: Heart size within normal limits. Aortic atherosclerosis. There is no appreciable airspace consolidation. No evidence of pleural effusion or pneumothorax. No acute bony abnormality identified.  IMPRESSION: No evidence of acute cardiopulmonary abnormality. Aortic Atherosclerosis (ICD10-I70.0). Electronically Signed   By: Kellie Simmering DO   On: 07/22/2019 12:01    Procedures Procedures (including critical care time)  Medications Ordered in ED Medications  aspirin EC tablet 81 mg (has no administration in time range)  nitroGLYCERIN (NITROSTAT) SL tablet 0.4 mg (has no administration in time range)  acetaminophen (TYLENOL) tablet 650 mg (has no administration in time range)  ondansetron (ZOFRAN) injection 4 mg (has no administration in time range)  heparin ADULT infusion 100 units/mL (25000 units/2109mL sodium chloride 0.45%) (1,200 Units/hr Intravenous New Bag/Given 07/22/19 1800)  albuterol (VENTOLIN HFA) 108 (90 Base) MCG/ACT inhaler 1-2 puff (has no administration in time range)  atorvastatin (LIPITOR) tablet 80 mg (has no administration in time range)  clopidogrel (PLAVIX) tablet 75 mg (has no administration in time range)  sacubitril-valsartan (ENTRESTO) 49-51 mg per tablet (has no administration in time range)  metoprolol succinate (TOPROL-XL) 24 hr tablet 50 mg (has no administration in time range)  nitroGLYCERIN (NITRODUR - Dosed in mg/24 hr) patch 0.4 mg (has no administration in time range)  spironolactone (ALDACTONE) tablet 25 mg (has no administration in time range)  sodium chloride flush (NS) 0.9 % injection 3 mL (3 mLs Intravenous Given 07/22/19 1426)  sodium chloride 0.9 % bolus 500 mL (0 mLs Intravenous Stopped 07/22/19 1712)  sodium chloride 0.9 % bolus 500 mL (500 mLs Intravenous New Bag/Given 07/22/19 1757)  heparin bolus via infusion 4,000 Units (4,000 Units Intravenous Bolus from Bag 07/22/19 1801)    ED Course  I have reviewed the triage vital signs and the nursing notes.  Pertinent labs & imaging results that were available during my care of the patient were reviewed by me and considered in my medical decision making (see chart for details).  Clinical Course as  of Jul 22 1854  Mon Jul 22, 2019  1404 Patient  with significant PMH of CAD and angina, how using his NTG more frequently. Has known CKD but unknown recent baseline. He is pain free now. Will discuss with Dr. Terrence Dupont.    [CS]  [phone] Care of the patient signed out to Dr. Darl Householder pending call back from Dr. Terrence Dupont.    [CS]    Clinical Course User Index [CS] Truddie Hidden, MD   MDM Rules/Calculators/A&P HEAR Score: 7                     Final Clinical Impression(s) / ED Diagnoses Final diagnoses:  Unstable angina [facility])    Rx / DC Orders ED Discharge Orders    None       Truddie Hidden, MD 07/22/19 [phone]

## 2019-07-22 NOTE — H&P (Signed)
Referring Physician: Terrence Dupont, MD   [name] [name] is an 69 y.o. male.                       Chief Complaint: Chest pain and dizziness  HPI: 69 years old black male with PMH of CAD, HTN, Hyperlipidemia, CKD, II, Hepatitis C and colon cancer with surgery in 2002 has chest pain with increased SL NTG use and near syncope for last few days. Currently he is chest pain free. 1st. HS- troponin I is normal. Chest x-ray is unremarkable. Creatinine is elevated at 2.03 mg. EKG shows NSR and poor r wave progression as before.  Past Medical History:  Diagnosis Date  . Anginal pain ([place])   . Cancer ([place])    colon  . Coronary artery disease   . Hepatitis    c  . Hyperlipidemia   . Hypertension   . Osteoarthritis of left knee 06/21/2011  . Shortness of breath       Past Surgical History:  Procedure Laterality Date  . CARDIAC CATHETERIZATION  12/31/2015  . CARDIAC CATHETERIZATION N/A 12/31/2015   Procedure: Left Heart Cath and Coronary Angiography;  Surgeon: Charolette Forward, MD;  Location: [facility];  Service: Cardiovascular;  Laterality: N/A;  . CARDIAC CATHETERIZATION N/A 12/31/2015   Procedure: Coronary Balloon Angioplasty;  Surgeon: Charolette Forward, MD;  Location: [facility];  Service: Cardiovascular;  Laterality: N/A;  . COLON SURGERY     2002   colon  ca   dr Bubba Camp      chemo  tx  . JOINT REPLACEMENT     rt knee  . LEFT HEART CATHETERIZATION WITH CORONARY ANGIOGRAM N/A 11/24/2011   Procedure: LEFT HEART CATHETERIZATION WITH CORONARY ANGIOGRAM;  Surgeon: Clent Demark, MD;  Location: [facility];  Service: Cardiovascular;  Laterality: N/A;  . PERCUTANEOUS CORONARY STENT INTERVENTION (PCI-S)  11/24/2011   Procedure: PERCUTANEOUS CORONARY STENT INTERVENTION (PCI-S);  Surgeon: Clent Demark, MD;  Location: [facility] CATH LAB;  Service: Cardiovascular;;  . REPLACEMENT TOTAL KNEE     bilateral knee replacements    Family History  Problem Relation Age of Onset  . Hypertension Mother    . Colon cancer Neg Hx   . Esophageal cancer Neg Hx   . Rectal cancer Neg Hx   . Stomach cancer Neg Hx    Social History:  reports that he has been smoking cigarettes. He has a 1.75 pack-year smoking history. He has never used smokeless tobacco. He reports current alcohol use of about 4.0 standard drinks of alcohol per week. He reports that he does not use drugs.  Allergies:  Allergies  Allergen Reactions  . Acyclovir And Related   . Sulfonamide Derivatives Rash and Other (See Comments)    REACTION: penile irritation    (Not in a hospital admission)   Results for orders placed or performed during the hospital encounter of 07/22/19 (from the past 48 hour(s))  Basic metabolic panel     Status: Abnormal   Collection Time: 07/22/19 11:32 AM  Result Value Ref Range   Sodium 139 135 - 145 mmol/L   Potassium 4.2 3.5 - 5.1 mmol/L   Chloride 110 98 - 111 mmol/L   CO2 20 (L) 22 - 32 mmol/L   Glucose, Bld 122 (H) 70 - 99 mg/dL    Comment: Glucose reference range applies only to samples taken after fasting for at least 8 hours.   BUN 17 8 - 23  mg/dL   Creatinine, Ser 2.03 (H) 0.61 - 1.24 mg/dL   Calcium 9.4 8.9 - 10.3 mg/dL   GFR calc non Af Amer 33 (L) >60 mL/min   GFR calc Af Amer 38 (L) >60 mL/min   Anion gap 9 5 - 15    Comment: Performed at [facility] [address], [place] [address]  CBC     Status: None   Collection Time: 07/22/19 11:32 AM  Result Value Ref Range   WBC 8.3 4.0 - 10.5 K/uL   RBC 4.49 4.22 - 5.81 MIL/uL   Hemoglobin 14.2 13.0 - 17.0 g/dL   HCT 44.4 39.0 - 52.0 %   MCV 98.9 80.0 - 100.0 fL   MCH 31.6 26.0 - 34.0 pg   MCHC 32.0 30.0 - 36.0 g/dL   RDW 14.0 11.5 - 15.5 %   Platelets 222 150 - 400 K/uL   nRBC 0.0 0.0 - 0.2 %    Comment: Performed at [facility], [place] [address], [place], [place] [address]  Troponin I (High Sensitivity)     Status: None   Collection Time: 07/22/19 11:32 AM  Result Value Ref Range   Troponin I (High  Sensitivity) 7 <18 ng/L    Comment: (NOTE) Elevated high sensitivity troponin I (hsTnI) values and significant  changes across serial measurements may suggest ACS but many other  chronic and acute conditions are known to elevate hsTnI results.  Refer to the "Links" section for chest pain algorithms and additional  guidance. Performed at [facility], [place] [address], [place], [place] [address]    DG Chest 2 View  Result Date: 07/22/2019 CLINICAL DATA:  Chest pain. Additional provided: Patient reports severe in sing intermittent chest pain with shortness of breath and sweating, dizziness, syncope, blurred vision yesterday, former smoker. EXAM: CHEST - 2 VIEW COMPARISON:  Chest radiograph 08/02/2013 FINDINGS: Heart size within normal limits. Aortic atherosclerosis. There is no appreciable airspace consolidation. No evidence of pleural effusion or pneumothorax. No acute bony abnormality identified. IMPRESSION: No evidence of acute cardiopulmonary abnormality. Aortic Atherosclerosis (ICD10-I70.0). Electronically Signed   By: Kellie Simmering DO   On: 07/22/2019 12:01    Review Of Systems Constitutional: No fever, chills, weight loss or gain. Eyes: No vision change, wears glasses. No discharge or pain. Ears: No hearing loss, No tinnitus. Respiratory: No asthma, COPD, pneumonias. Positive shortness of breath. No hemoptysis. Cardiovascular: Positive chest pain, palpitation, no leg edema. Gastrointestinal: No nausea, vomiting, diarrhea, constipation. No GI bleed. No hepatitis. Genitourinary: No dysuria, hematuria, kidney stone. No incontinance. Neurological: No headache, stroke, seizures.  Psychiatry: No psych facility admission for anxiety, depression, suicide. No detox. Skin: No rash. Musculoskeletal: Positive joint pain, no fibromyalgia. No neck pain, back pain. Lymphadenopathy: No lymphadenopathy. Hematology: No anemia or easy bruising.   Blood pressure 125/76, pulse (!) 51, temperature  98 F (36.7 C), temperature source Oral, resp. rate (!) 24, height 5\' 11"  (1.803 m), weight 97.5 kg, SpO2 98 %. Body mass index is 29.99 kg/m. General appearance: alert, cooperative, appears stated age and no distress Head: Normocephalic, atraumatic. Eyes: Brown eyes, pink conjunctiva, corneas clear. PERRL, EOM's intact. Neck: No adenopathy, no carotid bruit, no JVD, supple, symmetrical, trachea midline and thyroid not enlarged. Resp: Clear to auscultation bilaterally. Cardio: Regular rate and rhythm, S1, S2 normal, II/VI systolic murmur, no click, rub or gallop GI: Soft, non-tender; bowel sounds normal; no organomegaly. Extremities: No edema, cyanosis or clubbing. Skin: Warm and  dry.  Neurologic: Alert and oriented X 3, normal strength. Normal coordination.  Assessment/Plan Acute coronary syndrome Dizziness CAD Hypertension, essential Hyperlipidemia CKD, III  Admit. R/O MI. IV heparin. Monitor. NM myocardial perfusion stresss test in AM  Time spent: Review of old records, Lab, x-rays, EKG, other cardiac tests, examination, discussion with patient over 70 minutes.  Birdie Riddle, MD  07/22/2019, 4:44 PM

## 2019-07-22 NOTE — ED Provider Notes (Signed)
  Physical Exam  BP 125/76   Pulse (!) 51   Temp 98 F (36.7 C) (Oral)   Resp (!) 24   Ht 5\' 11"  (1.803 m)   Wt 97.5 kg   SpO2 98%   BMI 29.99 kg/m   Physical Exam  ED Course/Procedures   Clinical Course as of Jul 21 1616  Mon Jul 22, 2019  1404 Patient with significant PMH of CAD and angina, how using his NTG more frequently. Has known CKD but unknown recent baseline. He is pain free now. Will discuss with Dr. Terrence Dupont.    [CS]  [phone] Care of the patient signed out to Dr. Darl Householder pending call back from Dr. Terrence Dupont.    [CS]    Clinical Course User Index [CS] Truddie Hidden, MD    Procedures  MDM  Patient care signed out to me at 3:15 PM.  I attempted to call Dr. Terrence Dupont and it turned out that he is out of town, I talked to Dr. Doylene Canard who is covering him.  Dr. Doylene Canard will admit patient.      Drenda Freeze, MD 07/22/19 [phone]

## 2019-07-23 ENCOUNTER — Inpatient Hospital Stay (HOSPITAL_COMMUNITY): Payer: Medicare Other

## 2019-07-23 ENCOUNTER — Encounter (HOSPITAL_COMMUNITY): Payer: Self-pay | Admitting: Cardiovascular Disease

## 2019-07-23 LAB — BASIC METABOLIC PANEL
Anion gap: 10 (ref 5–15)
BUN: 17 mg/dL (ref 8–23)
CO2: 21 mmol/L — ABNORMAL LOW (ref 22–32)
Calcium: 8.6 mg/dL — ABNORMAL LOW (ref 8.9–10.3)
Chloride: 107 mmol/L (ref 98–111)
Creatinine, Ser: 1.52 mg/dL — ABNORMAL HIGH (ref 0.61–1.24)
GFR calc Af Amer: 54 mL/min — ABNORMAL LOW (ref >60)
GFR calc non Af Amer: 47 mL/min — ABNORMAL LOW (ref >60)
Glucose, Bld: 104 mg/dL — ABNORMAL HIGH (ref 70–99)
Potassium: 4.2 mmol/L (ref 3.5–5.1)
Sodium: 138 mmol/L (ref 135–145)

## 2019-07-23 LAB — CBC
HCT: 42.6 % (ref 39.0–52.0)
Hemoglobin: 13.7 g/dL (ref 13.0–17.0)
MCH: 31.4 pg (ref 26.0–34.0)
MCHC: 32.2 g/dL (ref 30.0–36.0)
MCV: 97.7 fL (ref 80.0–100.0)
Platelets: 201 10*3/uL (ref 150–400)
RBC: 4.36 MIL/uL (ref 4.22–5.81)
RDW: 13.9 % (ref 11.5–15.5)
WBC: 9.4 10*3/uL (ref 4.0–10.5)
nRBC: 0 % (ref 0.0–0.2)

## 2019-07-23 LAB — ECHOCARDIOGRAM COMPLETE
Height: 71 in
Weight: 3451.2 oz

## 2019-07-23 LAB — LIPID PANEL
Cholesterol: 114 mg/dL (ref 0–200)
HDL: 43 mg/dL (ref >40)
LDL Cholesterol: 63 mg/dL (ref 0–99)
Total CHOL/HDL Ratio: 2.7 RATIO
Triglycerides: 42 mg/dL (ref <150)
VLDL: 8 mg/dL (ref 0–40)

## 2019-07-23 LAB — PROTIME-INR
INR: 1.1 (ref 0.8–1.2)
Prothrombin Time: 13.6 seconds (ref 11.4–15.2)

## 2019-07-23 LAB — HIV ANTIBODY (ROUTINE TESTING W REFLEX): HIV Screen 4th Generation wRfx: NONREACTIVE

## 2019-07-23 LAB — HEPARIN LEVEL (UNFRACTIONATED): Heparin Unfractionated: 0.58 IU/mL (ref 0.30–0.70)

## 2019-07-23 MED ORDER — METOPROLOL SUCCINATE ER 25 MG PO TB24
25.0000 mg | ORAL_TABLET | Freq: Every day | ORAL | Status: DC
  Administered 2019-07-24 – 2019-07-25 (×2): 25 mg via ORAL
  Filled 2019-07-23 (×3): qty 1

## 2019-07-23 MED ORDER — TECHNETIUM TC 99M TETROFOSMIN IV KIT
30.0000 | PACK | Freq: Once | INTRAVENOUS | Status: AC | PRN
  Administered 2019-07-23: 30 via INTRAVENOUS

## 2019-07-23 NOTE — Progress Notes (Signed)
  Echocardiogram 2D Echocardiogram has been performed.  Darlina Sicilian M 07/23/2019, 10:11 AM

## 2019-07-23 NOTE — Progress Notes (Signed)
Ref: Nolene Ebbs, MD   Subjective:  Awake. No chest pain. He was given breakfast tray even when patient reminded he is NPO. VS stable except mild bradycardia.  Echocardiogram shows good LV systolic function per Dr. Terrence Dupont.  Objective:  Vital Signs in the last 24 hours: Temp:  [97.7 F (36.5 C)-100.9 F (38.3 C)] 98 F (36.7 C) (05/11 1612) Pulse Rate:  [52-64] 54 (05/11 1612) Cardiac Rhythm: Sinus bradycardia (05/11 1620) Resp:  [14-26] 18 (05/11 1612) BP: (119-157)/(61-111) 123/79 (05/11 1612) SpO2:  [91 %-99 %] 93 % (05/11 1612) Weight:  [97.8 kg] 97.8 kg (05/11 0828)  Physical Exam: BP Readings from Last 1 Encounters:  07/23/19 123/79     Wt Readings from Last 1 Encounters:  07/23/19 97.8 kg    Weight change:  Body mass index is 30.08 kg/m. HEENT: [place]/AT, Eyes-Brown, PERL, EOMI, Conjunctiva-Pink, Sclera-Non-icteric Neck: No JVD, No bruit, Trachea midline. Lungs:  Clear, Bilateral. Cardiac:  Regular rhythm, normal S1 and S2, no S3. II/VI systolic murmur. Abdomen:  Soft, non-tender. BS present. Extremities:  No edema present. No cyanosis. No clubbing. CNS: AxOx3, Cranial nerves grossly intact, moves all 4 extremities.  Skin: Warm and dry.   Intake/Output from previous day: No intake/output data recorded.    Lab Results: BMET    Component Value Date/Time   NA 138 07/23/2019 0404   NA 139 07/22/2019 1132   NA 136 01/01/2016 0539   NA 141 12/19/2012 1107   K 4.2 07/23/2019 0404   K 4.2 07/22/2019 1132   K 3.9 01/01/2016 0539   K 4.0 12/19/2012 1107   CL 107 07/23/2019 0404   CL 110 07/22/2019 1132   CL 109 01/01/2016 0539   CO2 21 (L) 07/23/2019 0404   CO2 20 (L) 07/22/2019 1132   CO2 24 01/01/2016 0539   CO2 25 12/19/2012 1107   GLUCOSE 104 (H) 07/23/2019 0404   GLUCOSE 122 (H) 07/22/2019 1132   GLUCOSE 93 01/01/2016 0539   GLUCOSE 104 12/19/2012 1107   BUN 17 07/23/2019 0404   BUN 17 07/22/2019 1132   BUN 10 01/01/2016 0539   BUN 9.6  12/19/2012 1107   CREATININE 1.52 (H) 07/23/2019 0404   CREATININE 2.03 (H) 07/22/2019 1132   CREATININE 1.35 (H) 01/01/2016 0539   CREATININE 1.3 12/19/2012 1107   CALCIUM 8.6 (L) 07/23/2019 0404   CALCIUM 9.4 07/22/2019 1132   CALCIUM 8.7 (L) 01/01/2016 0539   CALCIUM 9.3 12/19/2012 1107   GFRNONAA 47 (L) 07/23/2019 0404   GFRNONAA 33 (L) 07/22/2019 1132   GFRNONAA 54 (L) 01/01/2016 0539   GFRAA 54 (L) 07/23/2019 0404   GFRAA 38 (L) 07/22/2019 1132   GFRAA >60 01/01/2016 0539   CBC    Component Value Date/Time   WBC 9.4 07/23/2019 0404   RBC 4.36 07/23/2019 0404   HGB 13.7 07/23/2019 0404   HGB 14.8 12/19/2012 1107   HCT 42.6 07/23/2019 0404   HCT 44.2 12/19/2012 1107   PLT 201 07/23/2019 0404   PLT 215 12/19/2012 1107   MCV 97.7 07/23/2019 0404   MCV 95.5 12/19/2012 1107   MCH 31.4 07/23/2019 0404   MCHC 32.2 07/23/2019 0404   RDW 13.9 07/23/2019 0404   RDW 13.7 12/19/2012 1107   LYMPHSABS 1.7 12/19/2012 1107   MONOABS 0.4 12/19/2012 1107   EOSABS 0.2 12/19/2012 1107   BASOSABS 0.1 12/19/2012 1107   HEPATIC Function Panel No results for input(s): PROT in the last 8760 hours.  Invalid input(s):  ALBUMIN,  AST,  ALT,  ALKPHOS,  BILIDIR,  IBILI HEMOGLOBIN A1C No components found for: HGA1C,  MPG CARDIAC ENZYMES Lab Results  Component Value Date   TROPONINI 0.48 (HH) 08/03/2013   TROPONINI 0.57 (HH) 08/02/2013   BNP No results for input(s): PROBNP in the last 8760 hours. TSH No results for input(s): TSH in the last 8760 hours. CHOLESTEROL Recent Labs    07/23/19 0404  CHOL 114    Scheduled Meds: . aspirin EC  81 mg Oral Daily  . atorvastatin  80 mg Oral Daily  . clopidogrel  75 mg Oral Daily  . metoprolol succinate  50 mg Oral Daily  . nitroGLYCERIN  0.4 mg Transdermal Daily  . sacubitril-valsartan  1 tablet Oral BID  . spironolactone  25 mg Oral Daily   Continuous Infusions: . heparin 1,200 Units/hr (07/23/19 1053)   PRN Meds:.acetaminophen,  albuterol, nitroGLYCERIN, ondansetron (ZOFRAN) IV  Assessment/Plan: Acute coornary syndrome Dizziness CAD HTN Hyperlipidemia CKD, III  Hold AM metoprolol dose. Rest images today and stress images for nuclear stress test tomorrow.   LOS: 1 day   Time spent including chart review, lab review, examination, discussion with patient and nurse : 30 min   Dixie Dials  MD  07/23/2019, 6:21 PM

## 2019-07-23 NOTE — Progress Notes (Signed)
Patient arrived from ED at 0828hrs.  Denies CP at this time. Oriented to unit and plan of care for shift.  Patient states he ate breakfast.  Will notify Dr. Doylene Canard.

## 2019-07-23 NOTE — Progress Notes (Signed)
[facility] for heparin Indication: chest pain/ACS  Allergies  Allergen Reactions  . Acyclovir And Related   . Sulfonamide Derivatives Rash and Other (See Comments)    REACTION: penile irritation    Patient Measurements: Height: 5\' 11"  (180.3 cm) Weight: 97.5 kg (215 lb) IBW/kg (Calculated) : 75.3 Heparin Dosing Weight: 97 kg   Vital Signs: BP: 123/76 (05/10 2300) Pulse Rate: 53 (05/10 2230)  Labs: Recent Labs    07/22/19 1132 07/22/19 1932 07/22/19 2348  HGB 14.2  --   --   HCT 44.4  --   --   PLT 222  --   --   HEPARINUNFRC  --   --  0.58  CREATININE 2.03*  --   --   TROPONINIHS 7 7  --     Estimated Creatinine Clearance: 42.1 mL/min (A) (by C-G formula based on SCr of 2.03 mg/dL (H)).  Assessment: 69 y.o. male with chest pain for heparin  Goal of Therapy:  Heparin level 0.3-0.7 units/ml Monitor platelets by anticoagulation protocol: Yes   Plan:  Continue Heparin at current rate   Phillis Knack, PharmD, BCPS

## 2019-07-24 LAB — CBC
HCT: 41.4 % (ref 39.0–52.0)
Hemoglobin: 13.7 g/dL (ref 13.0–17.0)
MCH: 31.9 pg (ref 26.0–34.0)
MCHC: 33.1 g/dL (ref 30.0–36.0)
MCV: 96.5 fL (ref 80.0–100.0)
Platelets: 214 10*3/uL (ref 150–400)
RBC: 4.29 MIL/uL (ref 4.22–5.81)
RDW: 13.9 % (ref 11.5–15.5)
WBC: 9 10*3/uL (ref 4.0–10.5)
nRBC: 0 % (ref 0.0–0.2)

## 2019-07-24 LAB — HEPARIN LEVEL (UNFRACTIONATED): Heparin Unfractionated: 0.6 IU/mL (ref 0.30–0.70)

## 2019-07-24 MED ORDER — REGADENOSON 0.4 MG/5ML IV SOLN
0.4000 mg | Freq: Once | INTRAVENOUS | Status: AC
  Filled 2019-07-24: qty 5

## 2019-07-24 MED ORDER — HEPARIN SODIUM (PORCINE) 5000 UNIT/ML IJ SOLN: 5000.0000 [IU] | Freq: Three times a day (TID) | INTRAMUSCULAR | Status: DC

## 2019-07-24 MED ORDER — REGADENOSON 0.4 MG/5ML IV SOLN
INTRAVENOUS | Status: AC
  Administered 2019-07-24: 0.4 mg via INTRAVENOUS
  Filled 2019-07-24: qty 5

## 2019-07-24 MED ORDER — TECHNETIUM TC 99M TETROFOSMIN IV KIT
30.7000 | PACK | Freq: Once | INTRAVENOUS | Status: AC | PRN
  Administered 2019-07-24: 30.7 via INTRAVENOUS

## 2019-07-24 NOTE — Progress Notes (Signed)
Subjective:  Denies any chest pain or shortness of breath, seen in nuclear medicine department, tolerated the stress portion without any EKG changes.  Objective:  Vital Signs in the last 24 hours: Temp:  [98.1 F (36.7 C)-98.3 F (36.8 C)] 98.3 F (36.8 C) (05/12 0458) Pulse Rate:  [62-69] 66 (05/12 1133) BP: (108-128)/(61-91) 123/77 (05/12 1133) SpO2:  [94 %-97 %] 94 % (05/12 0458) Weight:  [96.3 kg] 96.3 kg (05/12 0458)  Intake/Output from previous day: 05/11 0701 - 05/12 0700 In: 754.3 [P.O.:358; I.V.:396.3] Out: 1375 [Urine:1375] Intake/Output from this shift: No intake/output data recorded.  Physical Exam: Neck: no adenopathy, no carotid bruit, no JVD and supple, symmetrical, trachea midline Lungs: clear to auscultation bilaterally Heart: regular rate and rhythm, S1, S2 normal and soft systolic murmur noted.  No S3 gallop Abdomen: soft, non-tender; bowel sounds normal; no masses,  no organomegaly Extremities: extremities normal, atraumatic, no cyanosis or edema  Lab Results: Recent Labs    07/23/19 0404 07/24/19 0614  WBC 9.4 9.0  HGB 13.7 13.7  PLT 201 214   Recent Labs    07/22/19 1132 07/23/19 0404  NA 139 138  K 4.2 4.2  CL 110 107  CO2 20* 21*  GLUCOSE 122* 104*  BUN 17 17  CREATININE 2.03* 1.52*   No results for input(s): TROPONINI in the last 72 hours.  Invalid input(s): CK, MB Hepatic Function Panel No results for input(s): PROT, ALBUMIN, AST, ALT, ALKPHOS, BILITOT, BILIDIR, IBILI in the last 72 hours. Recent Labs    07/23/19 0404  CHOL 114   No results for input(s): PROTIME in the last 72 hours.  Imaging: Imaging results have been reviewed and NM Myocar Multi W/Spect W/Wall Motion / EF  Result Date: 07/24/2019 CLINICAL DATA:  69 year old black male with coronary artery disease, presenting with progressively worsening chest pain over the past several days resulting in increasing suboptimal nitroglycerin use. Patient also describes near  syncopal in the past few days. EXAM: MYOCARDIAL IMAGING WITH SPECT (REST AND PHARMACOLOGIC-STRESS - 2 DAY PROTOCOL) GATED LEFT VENTRICULAR WALL MOTION STUDY LEFT VENTRICULAR EJECTION FRACTION TECHNIQUE: Standard myocardial SPECT imaging was performed after resting intravenous injection of 30 mCi Tc-85m tetrofosmin. Subsequently, on a second day, intravenous infusion of Lexiscan was performed under the supervision of the Cardiology staff. At peak effect of the drug, 30.7 mCi Tc-79m tetrofosmin was injected intravenously and standard myocardial SPECT imaging was performed. Quantitative gated imaging was also performed to evaluate left ventricular wall motion, and estimate left ventricular ejection fraction. COMPARISON:  05/07/2018 and earlier. FINDINGS: Perfusion: Large size moderate severity diminished activity involving the INFERIOR wall and apex on the post Lexiscan images, similar to the prior examination in February, 2020. Focal reversibility to the apex indicating peri-infarct ischemia, also similar to the prior examination. No new areas of diminished perfusion. Wall Motion: Global hypokinesis. Left Ventricular Ejection Fraction: [id number] End diastolic volume [id number] ml End systolic volume 61 ml IMPRESSION: 1. INFERIOR wall and apical infarction with minimal peri-infarct ischemia involving the apex, similar to the 05/07/2018 examination. 2. Global hypokinesis. 3. Left ventricular ejection fraction 46% 4. Non invasive risk stratification*: High. *2012 Appropriate Use Criteria for Coronary Revascularization Focused Update: J Am Coll Cardiol. [id number]. http://content.airportbarriers.com.aspx?articleid=1201161 Electronically Signed   By: Evangeline Dakin M.D.   On: 07/24/2019 15:29   ECHOCARDIOGRAM COMPLETE  Result Date: 07/23/2019    ECHOCARDIOGRAM REPORT   Patient Name:   [name] [name] Date of Exam: 07/23/2019 Medical Rec #:  [id number]  Height:       71.0 in Accession #:    [id number]         Weight:       215.0 lb Date of Birth:  October 14, 1950          BSA:          2.174 m Patient Age:    69 years          BP:           125/95 mmHg Patient Gender: M                 HR:           52 bpm. Exam Location:  Inpatient Procedure: 2D Echo Indications:     Acute Cornonary Syndrome I24.9  History:         Patient has no prior history of Echocardiogram examinations.                  CAD, Signs/Symptoms:Chest Pain; Risk Factors:Hypertension and                  Dyslipidemia. Dizziness. Hepatitis C.  Sonographer:     Darlina Sicilian RDCS Referring Phys:  [place] Diagnosing Phys: Charolette Forward MD IMPRESSIONS  1. Left ventricular ejection fraction, by estimation, is 50 to 55%. The left ventricle has normal function. The left ventricle has no regional wall motion abnormalities. Left ventricular diastolic parameters were normal.  2. Right ventricular systolic function is normal. The right ventricular size is normal.  3. The mitral valve is normal in structure. No evidence of mitral valve regurgitation.  4. The aortic valve is normal in structure. Aortic valve regurgitation is not visualized. No aortic stenosis is present.  5. The inferior vena cava is normal in size with greater than 50% respiratory variability, suggesting right atrial pressure of 3 mmHg. FINDINGS  Left Ventricle: Left ventricular ejection fraction, by estimation, is 50 to 55%. The left ventricle has normal function. The left ventricle has no regional wall motion abnormalities. The left ventricular internal cavity size was normal in size. There is  no left ventricular hypertrophy. Left ventricular diastolic parameters were normal. Right Ventricle: The right ventricular size is normal. No increase in right ventricular wall thickness. Right ventricular systolic function is normal. Left Atrium: Left atrial size was normal in size. Right Atrium: Right atrial size was normal in size. Pericardium: There is no evidence of pericardial effusion. Mitral  Valve: The mitral valve is normal in structure. No evidence of mitral valve regurgitation. Tricuspid Valve: The tricuspid valve is normal in structure. Tricuspid valve regurgitation is not demonstrated. Aortic Valve: The aortic valve is normal in structure. Aortic valve regurgitation is not visualized. No aortic stenosis is present. Pulmonic Valve: The pulmonic valve was normal in structure. Pulmonic valve regurgitation is not visualized. Aorta: The aortic root is normal in size and structure. Venous: The inferior vena cava is normal in size with greater than 50% respiratory variability, suggesting right atrial pressure of 3 mmHg. IAS/Shunts: There is right bowing of the interatrial septum, suggestive of elevated left atrial pressure. No atrial level shunt detected by color flow Doppler. There is no evidence of an atrial septal defect.  LEFT VENTRICLE PLAX 2D LVIDd:         5.10 cm  Diastology LVIDs:         3.70 cm  LV e' lateral:   9.00 cm/s LV PW:  0.80 cm  LV E/e' lateral: 6.8 LV IVS:        1.00 cm  LV e' medial:    5.59 cm/s LVOT diam:     2.00 cm  LV E/e' medial:  10.9 LV SV:         57 LV SV Index:   26 LVOT Area:     3.14 cm  RIGHT VENTRICLE RV S prime:     9.73 cm/s TAPSE (M-mode): 1.9 cm LEFT ATRIUM             Index       RIGHT ATRIUM           Index LA diam:        3.70 cm 1.70 cm/m  RA Area:     16.90 cm LA Vol (A2C):   57.6 ml 26.49 ml/m RA Volume:   35.80 ml  16.46 ml/m LA Vol (A4C):   36.3 ml 16.69 ml/m LA Biplane Vol: 45.8 ml 21.06 ml/m  AORTIC VALVE LVOT Vmax:   92.00 cm/s LVOT Vmean:  60.900 cm/s LVOT VTI:    0.183 m  AORTA Ao Root diam: 3.20 cm MITRAL VALVE MV Area (PHT): 2.62 cm    SHUNTS MV Decel Time: 289 msec    Systemic VTI:  0.18 m MV E velocity: 60.90 cm/s  Systemic Diam: 2.00 cm MV A velocity: 72.50 cm/s MV E/A ratio:  0.84 Charolette Forward MD Electronically signed by Charolette Forward MD Signature Date/Time: 07/23/2019/5:59:18 PM    Final     Cardiac  Studies:  Assessment/Plan:  Stable angina.  MI ruled out. Coronary artery disease, history of PCI to LAD in the past. Hypertension. Hyperlipidemia Chronic kidney disease stage III. History of tobacco abuse. Plan Discussed with patient regarding nuclear stress test results .  Has minimal peri-infarct ischemia.  LV function has improved as compared to prior nuclear stress test.  Discussed with patient and agrees for medical management.  Patient states has no ride today.  Will discharge tomorrow morning.  LOS: 2 days    Charolette Forward 07/24/2019, 5:02 PM

## 2019-07-24 NOTE — Progress Notes (Signed)
[facility] for heparin Indication: chest pain/ACS  Allergies  Allergen Reactions  . Acyclovir And Related   . Sulfonamide Derivatives Rash and Other (See Comments)    REACTION: penile irritation    Patient Measurements: Height: 5\' 11"  (180.3 cm) Weight: 96.3 kg (212 lb 3.2 oz) IBW/kg (Calculated) : 75.3 Heparin Dosing Weight: 97 kg   Vital Signs: Temp: 98.3 F (36.8 C) (05/12 0458) Temp Source: Oral (05/12 0458) BP: 123/77 (05/12 1133) Pulse Rate: 66 (05/12 1133)  Labs: Recent Labs    07/22/19 1132 07/22/19 1132 07/22/19 1932 07/22/19 2348 07/23/19 0404 07/24/19 0614  HGB 14.2   < >  --   --  13.7 13.7  HCT 44.4  --   --   --  42.6 41.4  PLT 222  --   --   --  201 214  LABPROT  --   --   --   --  13.6  --   INR  --   --   --   --  1.1  --   HEPARINUNFRC  --   --   --  0.58  --  0.60  CREATININE 2.03*  --   --   --  1.52*  --   TROPONINIHS 7  --  7  --   --   --    < > = values in this interval not displayed.    Estimated Creatinine Clearance: 55.8 mL/min (A) (by C-G formula based on SCr of 1.52 mg/dL (H)).  Assessment: 69 y.o. male with chest pain on heparin. Stress test results pending -hg= 13.7  Goal of Therapy:  Heparin level 0.3-0.7 units/ml Monitor platelets by anticoagulation protocol: Yes   Plan:  No heparin changes needed Daily heparin level and CBC  Hildred Laser, PharmD Clinical Pharmacist **Pharmacist phone directory can now be found on [email or web address] ([email or web address]).  Listed under [place].

## 2019-07-25 LAB — CBC
HCT: 44.5 % (ref 39.0–52.0)
Hemoglobin: 14.3 g/dL (ref 13.0–17.0)
MCH: 31.5 pg (ref 26.0–34.0)
MCHC: 32.1 g/dL (ref 30.0–36.0)
MCV: 98 fL (ref 80.0–100.0)
Platelets: 252 10*3/uL (ref 150–400)
RBC: 4.54 MIL/uL (ref 4.22–5.81)
RDW: 13.9 % (ref 11.5–15.5)
WBC: 8.5 10*3/uL (ref 4.0–10.5)
nRBC: 0 % (ref 0.0–0.2)

## 2019-07-25 MED ORDER — AMLODIPINE BESYLATE 2.5 MG PO TABS
2.5000 mg | ORAL_TABLET | Freq: Every day | ORAL | Status: DC
  Administered 2019-07-26: 2.5 mg via ORAL
  Filled 2019-07-25: qty 1

## 2019-07-25 MED ORDER — ASPIRIN 81 MG PO CHEW
81.0000 mg | CHEWABLE_TABLET | ORAL | Status: AC
  Administered 2019-07-26: 81 mg via ORAL
  Filled 2019-07-25: qty 1

## 2019-07-25 MED ORDER — SODIUM CHLORIDE 0.9 % IV SOLN: 250.0000 mL | INTRAVENOUS | Status: DC | PRN

## 2019-07-25 MED ORDER — SACUBITRIL-VALSARTAN 24-26 MG PO TABS
1.0000 | ORAL_TABLET | Freq: Two times a day (BID) | ORAL | Status: DC
  Administered 2019-07-25 – 2019-07-26 (×2): 1 via ORAL
  Filled 2019-07-25 (×2): qty 1

## 2019-07-25 MED ORDER — SODIUM CHLORIDE 0.9 % WEIGHT BASED INFUSION
1.0000 mL/kg/h | INTRAVENOUS | Status: DC
  Administered 2019-07-25: 1 mL/kg/h via INTRAVENOUS
  Administered 2019-07-26: 500 mL via INTRAVENOUS

## 2019-07-25 MED ORDER — SODIUM CHLORIDE 0.9% FLUSH: 3.0000 mL | INTRAVENOUS | Status: DC | PRN

## 2019-07-25 MED ORDER — AMLODIPINE BESYLATE 2.5 MG PO TABS
2.5000 mg | ORAL_TABLET | Freq: Every day | ORAL | 11 refills | Status: AC
Start: 2019-07-25 — End: 2023-02-08

## 2019-07-25 MED ORDER — SODIUM CHLORIDE 0.9% FLUSH
3.0000 mL | Freq: Two times a day (BID) | INTRAVENOUS | Status: DC
  Administered 2019-07-25: 3 mL via INTRAVENOUS

## 2019-07-25 NOTE — H&P (View-Only) (Signed)
Subjective:  Complains of recurrent retrosternal chest pain earlier this morning and again this morning resolved with 1 sublingual nitro without associated symptoms.  Discussed with patient various options of treatment his risk and benefits and consents for PCI.  Patient presently denies any chest pain.  Objective:  Vital Signs in the last 24 hours: Temp:  [97.6 F (36.4 C)-98 F (36.7 C)] 97.6 F (36.4 C) (05/13 0408) Pulse Rate:  [58-66] 64 (05/13 0408) Resp:  [14-16] 16 (05/13 0408) BP: (81-123)/(58-94) 116/70 (05/13 0629) SpO2:  [95 %-96 %] 96 % (05/13 0408) Weight:  [95 kg] 95 kg (05/13 0629)  Intake/Output from previous day: 05/12 0701 - 05/13 0700 In: 720 [P.O.:720] Out: 575 [Urine:575] Intake/Output from this shift: No intake/output data recorded.  Physical Exam: Neck: no adenopathy, no carotid bruit, no JVD and supple, symmetrical, trachea midline Lungs: clear to auscultation bilaterally Heart: regular rate and rhythm, S1, S2 normal and Soft systolic murmur noted no S3 gallop Abdomen: soft, non-tender; bowel sounds normal; no masses,  no organomegaly Extremities: extremities normal, atraumatic, no cyanosis or edema  Lab Results: Recent Labs    07/24/19 0614 07/25/19 0858  WBC 9.0 8.5  HGB 13.7 14.3  PLT 214 252   Recent Labs    07/22/19 1132 07/23/19 0404  NA 139 138  K 4.2 4.2  CL 110 107  CO2 20* 21*  GLUCOSE 122* 104*  BUN 17 17  CREATININE 2.03* 1.52*   No results for input(s): TROPONINI in the last 72 hours.  Invalid input(s): CK, MB Hepatic Function Panel No results for input(s): PROT, ALBUMIN, AST, ALT, ALKPHOS, BILITOT, BILIDIR, IBILI in the last 72 hours. Recent Labs    07/23/19 0404  CHOL 114   No results for input(s): PROTIME in the last 72 hours.  Imaging: Imaging results have been reviewed and NM Myocar Multi W/Spect W/Wall Motion / EF  Result Date: 07/24/2019 CLINICAL DATA:  69 year old black male with coronary artery disease,  presenting with progressively worsening chest pain over the past several days resulting in increasing suboptimal nitroglycerin use. Patient also describes near syncopal in the past few days. EXAM: MYOCARDIAL IMAGING WITH SPECT (REST AND PHARMACOLOGIC-STRESS - 2 DAY PROTOCOL) GATED LEFT VENTRICULAR WALL MOTION STUDY LEFT VENTRICULAR EJECTION FRACTION TECHNIQUE: Standard myocardial SPECT imaging was performed after resting intravenous injection of 30 mCi Tc-7m tetrofosmin. Subsequently, on a second day, intravenous infusion of Lexiscan was performed under the supervision of the Cardiology staff. At peak effect of the drug, 30.7 mCi Tc-91m tetrofosmin was injected intravenously and standard myocardial SPECT imaging was performed. Quantitative gated imaging was also performed to evaluate left ventricular wall motion, and estimate left ventricular ejection fraction. COMPARISON:  05/07/2018 and earlier. FINDINGS: Perfusion: Large size moderate severity diminished activity involving the INFERIOR wall and apex on the post Lexiscan images, similar to the prior examination in February, 2020. Focal reversibility to the apex indicating peri-infarct ischemia, also similar to the prior examination. No new areas of diminished perfusion. Wall Motion: Global hypokinesis. Left Ventricular Ejection Fraction: [id number] End diastolic volume XX123456 ml End systolic volume 61 ml IMPRESSION: 1. INFERIOR wall and apical infarction with minimal peri-infarct ischemia involving the apex, similar to the 05/07/2018 examination. 2. Global hypokinesis. 3. Left ventricular ejection fraction 46% 4. Non invasive risk stratification*: High. *2012 Appropriate Use Criteria for Coronary Revascularization Focused Update: J Am Coll Cardiol. N6492421. http://content.[email or web address]?[email or web address] Electronically Signed   By: Evangeline Dakin M.D.   On: 07/24/2019 15:29    Cardiac Studies:  Assessment/Plan:  Unstable angina positive  nuclear stress test Coronary artery disease, history of PCI to LAD in the past. Hypertension. Hyperlipidemia Chronic kidney disease stage III. History of tobacco abuse. History of hepatitis C in the past Plan Discussed with patient at length various options for treatment as he is having recurrent chest pain i.e. invasive left cath possible PTCA stenting its risk and benefits i.e. death MI stroke need for emergency CABG local vascular complications staging the procedure in view of chronic kidney disease etc. and consents for PCI.  Patient had breakfast earlier today and due to scheduling issues we will schedule first case early in the morning.  LOS: 3 days    Charolette Forward 07/25/2019, 10:15 AM

## 2019-07-25 NOTE — Progress Notes (Signed)
Subjective:  Complains of recurrent retrosternal chest pain earlier this morning and again this morning resolved with 1 sublingual nitro without associated symptoms.  Discussed with patient various options of treatment his risk and benefits and consents for PCI.  Patient presently denies any chest pain.  Objective:  Vital Signs in the last 24 hours: Temp:  [97.6 F (36.4 C)-98 F (36.7 C)] 97.6 F (36.4 C) (05/13 0408) Pulse Rate:  [58-66] 64 (05/13 0408) Resp:  [14-16] 16 (05/13 0408) BP: (81-123)/(58-94) 116/70 (05/13 0629) SpO2:  [95 %-96 %] 96 % (05/13 0408) Weight:  [95 kg] 95 kg (05/13 0629)  Intake/Output from previous day: 05/12 0701 - 05/13 0700 In: 720 [P.O.:720] Out: 575 [Urine:575] Intake/Output from this shift: No intake/output data recorded.  Physical Exam: Neck: no adenopathy, no carotid bruit, no JVD and supple, symmetrical, trachea midline Lungs: clear to auscultation bilaterally Heart: regular rate and rhythm, S1, S2 normal and Soft systolic murmur noted no S3 gallop Abdomen: soft, non-tender; bowel sounds normal; no masses,  no organomegaly Extremities: extremities normal, atraumatic, no cyanosis or edema  Lab Results: Recent Labs    07/24/19 0614 07/25/19 0858  WBC 9.0 8.5  HGB 13.7 14.3  PLT 214 252   Recent Labs    07/22/19 1132 07/23/19 0404  NA 139 138  K 4.2 4.2  CL 110 107  CO2 20* 21*  GLUCOSE 122* 104*  BUN 17 17  CREATININE 2.03* 1.52*   No results for input(s): TROPONINI in the last 72 hours.  Invalid input(s): CK, MB Hepatic Function Panel No results for input(s): PROT, ALBUMIN, AST, ALT, ALKPHOS, BILITOT, BILIDIR, IBILI in the last 72 hours. Recent Labs    07/23/19 0404  CHOL 114   No results for input(s): PROTIME in the last 72 hours.  Imaging: Imaging results have been reviewed and NM Myocar Multi W/Spect W/Wall Motion / EF  Result Date: 07/24/2019 CLINICAL DATA:  69 year old black male with coronary artery disease,  69 year old presenting with progressively worsening chest pain over the past several days resulting in increasing suboptimal nitroglycerin use. Patient also describes near syncopal in the past few days. EXAM: MYOCARDIAL IMAGING WITH SPECT (REST AND PHARMACOLOGIC-STRESS - 2 DAY PROTOCOL) GATED LEFT VENTRICULAR WALL MOTION STUDY LEFT VENTRICULAR EJECTION FRACTION TECHNIQUE: Standard myocardial SPECT imaging was performed after resting intravenous injection of 30 mCi Tc-38m tetrofosmin. Subsequently, on a second day, intravenous infusion of Lexiscan was performed under the supervision of the Cardiology staff. At peak effect of the drug, 30.7 mCi Tc-53m tetrofosmin was injected intravenously and standard myocardial SPECT imaging was performed. Quantitative gated imaging was also performed to evaluate left ventricular wall motion, and estimate left ventricular ejection fraction. COMPARISON:  05/07/2018 and earlier. FINDINGS: Perfusion: Large size moderate severity diminished activity involving the INFERIOR wall and apex on the post Lexiscan images, similar to the prior examination in February, 2020. Focal reversibility to the apex indicating peri-infarct ischemia, also similar to the prior examination. No new areas of diminished perfusion. Wall Motion: Global hypokinesis. Left Ventricular Ejection Fraction: [id number] End diastolic volume XX123456 ml End systolic volume 61 ml IMPRESSION: 1. INFERIOR wall and apical infarction with minimal peri-infarct ischemia involving the apex, similar to the 05/07/2018 examination. 2. Global hypokinesis. 3. Left ventricular ejection fraction 46% 4. Non invasive risk stratification*: High. *2012 Appropriate Use Criteria for Coronary Revascularization Focused Update: J Am Coll Cardiol. N6492421. http://[email or web address]?[email or web address] Electronically Signed   By: Evangeline Dakin M.D.   On: 07/24/2019 15:29    Cardiac Studies:  Assessment/Plan:  Unstable angina positive  nuclear stress test Coronary artery disease, history of PCI to LAD in the past. Hypertension. Hyperlipidemia Chronic kidney disease stage III. History of tobacco abuse. History of hepatitis C in the past Plan Discussed with patient at length various options for treatment as he is having recurrent chest pain i.e. invasive left cath possible PTCA stenting its risk and benefits i.e. death MI stroke need for emergency CABG local vascular complications staging the procedure in view of chronic kidney disease etc. and consents for PCI.  Patient had breakfast earlier today and due to scheduling issues we will schedule first case early in the morning.  LOS: 3 days    Charolette Forward 07/25/2019, 10:15 AM

## 2019-07-25 NOTE — Progress Notes (Signed)
Patient complained of 4/10 chest pain and requested a sublingual nitro. Post nitro BP 81/61 pt asymptomatic and chest pain free. EKG done

## 2019-07-25 NOTE — Progress Notes (Signed)
Pt called out stating he was having chest pain. By the time this RN went into his room, he advised the pain was about an 8/10 but had resolved on it's own.

## 2019-07-26 ENCOUNTER — Encounter (HOSPITAL_COMMUNITY)
Admission: EM | Disposition: A | Discharge status: Home / Self Care | Payer: Self-pay | Source: Home / Self Care | Attending: Cardiovascular Disease

## 2019-07-26 HISTORY — PX: LEFT HEART CATH AND CORONARY ANGIOGRAPHY: CATH118249

## 2019-07-26 LAB — BASIC METABOLIC PANEL
Anion gap: 9 (ref 5–15)
BUN: 13 mg/dL (ref 8–23)
CO2: 20 mmol/L — ABNORMAL LOW (ref 22–32)
Calcium: 8.5 mg/dL — ABNORMAL LOW (ref 8.9–10.3)
Chloride: 108 mmol/L (ref 98–111)
Creatinine, Ser: 1.45 mg/dL — ABNORMAL HIGH (ref 0.61–1.24)
GFR calc Af Amer: 57 mL/min — ABNORMAL LOW (ref >60)
GFR calc non Af Amer: 49 mL/min — ABNORMAL LOW (ref >60)
Glucose, Bld: 96 mg/dL (ref 70–99)
Potassium: 4.1 mmol/L (ref 3.5–5.1)
Sodium: 137 mmol/L (ref 135–145)

## 2019-07-26 LAB — CBC
HCT: 40.3 % (ref 39.0–52.0)
Hemoglobin: 13 g/dL (ref 13.0–17.0)
MCH: 31.5 pg (ref 26.0–34.0)
MCHC: 32.3 g/dL (ref 30.0–36.0)
MCV: 97.6 fL (ref 80.0–100.0)
Platelets: 179 10*3/uL (ref 150–400)
RBC: 4.13 MIL/uL — ABNORMAL LOW (ref 4.22–5.81)
RDW: 13.9 % (ref 11.5–15.5)
WBC: 8.5 10*3/uL (ref 4.0–10.5)
nRBC: 0 % (ref 0.0–0.2)

## 2019-07-26 SURGERY — LEFT HEART CATH AND CORONARY ANGIOGRAPHY
Anesthesia: LOCAL

## 2019-07-26 MED ORDER — SODIUM CHLORIDE 0.9 % IV SOLN: INTRAVENOUS | Status: AC

## 2019-07-26 MED ORDER — FENTANYL CITRATE (PF) 100 MCG/2ML IJ SOLN
INTRAMUSCULAR | Status: DC | PRN
  Administered 2019-07-26 (×2): 25 ug via INTRAVENOUS

## 2019-07-26 MED ORDER — SACUBITRIL-VALSARTAN 24-26 MG PO TABS: 1.0000 | ORAL_TABLET | Freq: Two times a day (BID) | ORAL | 3 refills | Status: AC

## 2019-07-26 MED ORDER — HEPARIN (PORCINE) IN NACL 1000-0.9 UT/500ML-% IV SOLN
INTRAVENOUS | Status: DC | PRN
  Administered 2019-07-26 (×2): 500 mL

## 2019-07-26 MED ORDER — SODIUM CHLORIDE 0.9 % IV SOLN: 250.0000 mL | INTRAVENOUS | Status: DC | PRN

## 2019-07-26 MED ORDER — FENTANYL CITRATE (PF) 100 MCG/2ML IJ SOLN
INTRAMUSCULAR | Status: AC
  Filled 2019-07-26: qty 2

## 2019-07-26 MED ORDER — METOPROLOL SUCCINATE ER 25 MG PO TB24: 25.0000 mg | ORAL_TABLET | Freq: Every day | ORAL | 3 refills | Status: AC

## 2019-07-26 MED ORDER — SODIUM CHLORIDE 0.9% FLUSH: 3.0000 mL | INTRAVENOUS | Status: DC | PRN

## 2019-07-26 MED ORDER — LIDOCAINE HCL (PF) 1 % IJ SOLN
INTRAMUSCULAR | Status: DC | PRN
  Administered 2019-07-26: 15 mL

## 2019-07-26 MED ORDER — ONDANSETRON HCL 4 MG/2ML IJ SOLN: 4.0000 mg | Freq: Four times a day (QID) | INTRAMUSCULAR | Status: DC | PRN

## 2019-07-26 MED ORDER — ACETAMINOPHEN 325 MG PO TABS: 650.0000 mg | ORAL_TABLET | ORAL | Status: DC | PRN

## 2019-07-26 MED ORDER — SODIUM CHLORIDE 0.9% FLUSH: 3.0000 mL | Freq: Two times a day (BID) | INTRAVENOUS | Status: DC

## 2019-07-26 MED ORDER — MIDAZOLAM HCL 2 MG/2ML IJ SOLN
INTRAMUSCULAR | Status: AC
  Filled 2019-07-26: qty 2

## 2019-07-26 MED ORDER — IOHEXOL 350 MG/ML SOLN
INTRAVENOUS | Status: DC | PRN
  Administered 2019-07-26: 50 mL via INTRA_ARTERIAL

## 2019-07-26 MED ORDER — LIDOCAINE HCL (PF) 1 % IJ SOLN
INTRAMUSCULAR | Status: AC
  Filled 2019-07-26: qty 30

## 2019-07-26 MED ORDER — MIDAZOLAM HCL 2 MG/2ML IJ SOLN
INTRAMUSCULAR | Status: DC | PRN
  Administered 2019-07-26 (×2): 1 mg via INTRAVENOUS

## 2019-07-26 MED ORDER — HEPARIN (PORCINE) IN NACL 1000-0.9 UT/500ML-% IV SOLN
INTRAVENOUS | Status: AC
  Filled 2019-07-26: qty 1000

## 2019-07-26 SURGICAL SUPPLY — 8 items
CATH INFINITI 5 FR 3DRC (CATHETERS) ×1 IMPLANT
CATH INFINITI 5FR MULTPACK ANG (CATHETERS) ×1 IMPLANT
KIT HEART LEFT (KITS) ×2 IMPLANT
PACK CARDIAC CATHETERIZATION (CUSTOM PROCEDURE TRAY) ×2 IMPLANT
SHEATH PINNACLE 5F 10CM (SHEATH) ×1 IMPLANT
SHEATH PINNACLE 6F 10CM (SHEATH) ×1 IMPLANT
TRANSDUCER W/STOPCOCK (MISCELLANEOUS) ×1 IMPLANT
WIRE EMERALD 3MM-J .035X150CM (WIRE) ×1 IMPLANT

## 2019-07-26 NOTE — Progress Notes (Signed)
Patient discharged in stable condition to private vehicle. He had discharge instructions and had no further questions

## 2019-07-26 NOTE — Interval H&P Note (Signed)
Cath Lab Visit (complete for each Cath Lab visit)  Clinical Evaluation Leading to the Procedure:   ACS: Yes.    Non-ACS:    Anginal Classification: CCS IV  Anti-ischemic medical therapy: Maximal Therapy (2 or more classes of medications)  Non-Invasive Test Results: High-risk stress test findings: cardiac mortality >3%/year  Prior CABG: No previous CABG      History and Physical Interval Note:  07/26/2019 7:24 AM  Brynda Greathouse  has presented today for surgery, with the diagnosis of unstable angina.  The various methods of treatment have been discussed with the patient and family. After consideration of risks, benefits and other options for treatment, the patient has consented to  Procedure(s): LEFT HEART CATH AND CORONARY ANGIOGRAPHY (N/A) as a surgical intervention.  The patient's history has been reviewed, patient examined, no change in status, stable for surgery.  I have reviewed the patient's chart and labs.  Questions were answered to the patient's satisfaction.     Charolette Forward

## 2019-07-26 NOTE — Progress Notes (Signed)
Site area: Right groin a 5 french arterial sheath was removed by Janith Lima RN 2 H  Site Prior to Removal:  Level 0  Pressure Applied For 15 MINUTES    Bedrest Beginning at 0855am  Manual:   Yes.    Patient Status During Pull:  stable  Post Pull Groin Site:  Level 0  Post Pull Instructions Given:  Yes.    Post Pull Pulses Present:  Yes.    Dressing Applied:  Yes.    Comments:

## 2019-07-26 NOTE — Discharge Summary (Signed)
Discharge summary dictated on 07/26/2019, dictation number is [phone]

## 2019-07-26 NOTE — Discharge Instructions (Signed)
Coronary Angiogram A coronary angiogram is an X-ray procedure that is used to examine the arteries in the heart. Contrast dye is injected through a long, thin tube (catheter) into these arteries. Then X-rays are taken to show any blockage in these arteries. You may have this procedure if you:  Are having chest pain, or other symptoms of angina, and you are at risk for heart disease.  Have an abnormal stress test or test of your heart's electrical activity (electrocardiogram, or ECG).  Have chest pain and heart failure.  Are having irregular heart rhythms. A coronary angiogram or heart catheterization can show if you have valve disease or a disease of the aorta. This procedure can also be used to check the overall function of your heart muscle. Let your health care provider know about:  Any allergies you have, including allergies to medicines or contrast dye.  All medicines you are taking, including vitamins, herbs, eye drops, creams, and over-the-counter medicines.  Any problems you or family members have had with anesthetic medicines.  Any blood disorders you have.  Any surgeries you have had.  Any history of kidney problems or kidney failure.  Any medical conditions you have.  Whether you are pregnant or may be pregnant.  Whether you are breastfeeding. What are the risks? Generally, this is a safe procedure. However, problems may occur, including:  Infection.  Allergic reaction to medicines or dyes that are used.  Bleeding from the insertion site or other places.  Damage to nearby structures, such as blood vessels, or damage to kidneys from contrast dye.  Irregular heart rhythms.  Stroke (rare).  Heart attack (rare). What happens before the procedure? Staying hydrated Follow instructions from your health care provider about hydration, which may include:  Up to 2 hours before the procedure - you may continue to drink clear liquids, such as water, clear fruit juice,  black coffee, and plain tea.  Eating and drinking restrictions Follow instructions from your health care provider about eating and drinking, which may include:  8 hours before the procedure - stop eating heavy meals or foods, such as meat, fried foods, or fatty foods.  6 hours before the procedure - stop eating light meals or foods, such as toast or cereal.  6 hours before the procedure - stop drinking milk or drinks that contain milk.  2 hours before the procedure - stop drinking clear liquids. Medicines Ask your health care provider about:  Changing or stopping your regular medicines. This is especially important if you are taking diabetes medicines or blood thinners.  Taking medicines such as aspirin and ibuprofen. These medicines can thin your blood. Do not take these medicines unless your health care provider tells you to take them. Aspirin may be recommended before coronary angiograms even if you do not normally take it.  Taking over-the-counter medicines, vitamins, herbs, and supplements. General instructions  Do not use any products that contain nicotine or tobacco for at least 4 weeks before the procedure. These products include cigarettes, e-cigarettes, and chewing tobacco. If you need help quitting, ask your health care provider.  You may have an exam or testing.  Plan to have someone take you home from the hospital or clinic.  If you will be going home right after the procedure, plan to have someone with you for 24 hours.  Ask your health care provider: ? How your insertion site will be marked. ? What steps will be taken to help prevent infection. These may include:  Removing  hair at the insertion site.  Washing skin with a germ-killing soap.  Taking antibiotic medicine. What happens during the procedure?   You will lie on your back on an X-ray table.  An IV will be inserted into one of your veins.  Electrodes will be placed on your chest.  You will be  given one or more of the following: ? A medicine to help you relax (sedative). ? A medicine to numb the catheter insertion area (local anesthetic).  You will be connected to a continuous ECG monitor.  The catheter will be inserted into an artery in one of these areas: ? Your groin area in your upper thigh. ? Your wrist. ? The fold of your arm, near your elbow.  An X-ray procedure (fluoroscopy) will be used to help guide the catheter to the opening of the blood vessel to be used.  A dye will be injected into the catheter and X-rays will be taken. The dye will help to show any narrowing or blockages in the heart arteries.  Tell your health care provider if you have chest pain or trouble breathing.  If blockages are found, another procedure may be done to open the artery.  The catheter will be removed after the fluoroscopy is complete.  A bandage (dressing) will be placed over the insertion site. Pressure will be applied to stop bleeding.  The IV will be removed. The procedure may vary among health care providers and hospitals. What happens after the procedure?  Your blood pressure, heart rate, breathing rate, and blood oxygen level will be monitored until you leave the hospital or clinic.  You will need to lie still for a few hours, or for as long as told by your health care provider. ? If the procedure is done through the groin, you will be told not to bend or cross your legs.  The insertion site and the pulse in your foot or wrist will be checked often.  More blood tests, X-rays, and an ECG may be done.  Do not drive for 24 hours if you were given a sedative during your procedure. Summary  A coronary angiogram is an X-ray procedure that is used to examine the arteries in the heart.  Contrast dye is injected through a long, thin tube (catheter) into each artery.  Tell your health care provider about any allergies you have, including allergies to contrast dye.  After the  procedure, you will need to lie still for a few hours and drink plenty of fluids. This information is not intended to replace advice given to you by your health care provider. Make sure you discuss any questions you have with your health care provider. Document Revised: 09/20/2018 Document Reviewed: 09/20/2018 Elsevier Patient Education  [place]. Angina  Angina is very bad discomfort or pain in the chest, neck, arm, jaw, or back. The discomfort is caused by a lack of blood in the middle layer of the heart wall (myocardium). What are the causes? This condition is caused by a buildup of fat and cholesterol (plaque) in your arteries (atherosclerosis). This buildup narrows the arteries and makes it hard for blood to flow. What increases the risk? You are more likely to develop this condition if:  You have high levels of cholesterol in your blood.  You have high blood pressure (hypertension).  You have diabetes.  You have a family history of heart disease.  You are not active, or you do not exercise enough.  You feel sad (  depressed).  You have been treated with high energy rays (radiation) on the left side of your chest. Other risk factors are:  Using tobacco.  Being very overweight (obese).  Eating a diet high in unhealthy fats (saturated fats).  Having stress, or being exposed to things that cause stress.  Using drugs, such as cocaine. Women have a greater risk for angina if:  They are older than 95.  They have stopped having their period (are in postmenopause). What are the signs or symptoms? Common symptoms of this condition in both men and women may include:  Chest pain, which may: ? Feel like a crushing or squeezing in the chest. ? Feel like a tightness, pressure, fullness, or heaviness in the chest. ? Last for more than a few minutes at a time. ? Stop and come back (recur) after a few minutes.  Pain in the neck, arm, jaw, or back.  Heartburn or upset  stomach (indigestion) for no reason.  Being short of breath.  Feeling sick to your stomach (nauseous).  Sudden cold sweats. Women and people with diabetes may have other symptoms that are not usual, such as feeling:  Tired (fatigue).  Worried or nervous (anxious) for no reason.  Weak for no reason.  Dizzy or passing out (fainting). How is this treated? This condition may be treated with:  Medicines. These are given to: ? Prevent blood clots. ? Prevent heart attack. ? Relax blood vessels and improve blood flow to the heart (nitrates). ? Reduce blood pressure. ? Improve the pumping action of the heart. ? Reduce fat and cholesterol in the blood.  A procedure to widen a narrowed or blocked artery in the heart (angioplasty).  Surgery to allow blood to go around a blocked artery (coronary artery bypass surgery). Follow these instructions at home: Medicines  Take over-the-counter and prescription medicines only as told by your doctor.  Do not take these medicines unless your doctor says that you can: ? NSAIDs. These include:  Ibuprofen.  Naproxen. ? Vitamin supplements that have vitamin A, vitamin E, or both. ? Hormone therapy that contains estrogen with or without progestin. Eating and drinking   Eat a heart-healthy diet that includes: ? Lots of fresh fruits and vegetables. ? Whole grains. ? Low-fat (lean) protein. ? Low-fat dairy products.  Follow instructions from your doctor about what you cannot eat or drink. Activity  Follow an exercise program that your doctor tells you.  Talk with your doctor about joining a program to help improve the health of your heart (cardiac rehab).  When you feel tired, take a break. Plan breaks if you know you are going to feel tired. Lifestyle   Do not use any products that contain nicotine or tobacco. This includes cigarettes, e-cigarettes, and chewing tobacco. If you need help quitting, ask your doctor.  If your doctor  says you can drink alcohol: ? Limit how much you use to:  0-1 drink a day for women who are not pregnant.  0-2 drinks a day for men. ? Be aware of how much alcohol is in your drink. In the [place] one drink equals:  One 12 oz bottle of beer (355 mL).  One 5 oz glass of wine (148 mL).  One 1 oz glass of hard liquor (44 mL). General instructions  Stay at a healthy weight. If your doctor tells you to do so, work with him or her to lose weight.  Learn to deal with stress. If you need help, ask your  doctor.  Keep your vaccines up to date. Get a flu shot every year.  Talk with your doctor if you feel sad. Take a screening test to see if you are at risk for depression.  Work with your doctor to manage any other health problems that you have. These may include diabetes or high blood pressure.  Keep all follow-up visits as told by your doctor. This is important. Get help right away if:  You have pain in your chest, neck, arm, jaw, or back, and the pain: ? Lasts more than a few minutes. ? Comes back. ? Does not get better after you take medicine under your tongue (sublingual nitroglycerin). ? Keeps getting worse. ? Comes more often.  You have any of these problems for no reason: ? Sweating a lot. ? Heartburn or upset stomach. ? Shortness of breath. ? Trouble breathing. ? Feeling sick to your stomach. ? Throwing up (vomiting). ? Feeling more tired than normal. ? Feeling nervous or worrying more than normal. ? Weakness.  You are suddenly dizzy or light-headed.  You pass out. These symptoms may be an emergency. Do not wait to see if the symptoms will go away. Get medical help right away. Call your local emergency services (911 in the [place] Do not drive yourself to the hospital. Summary  Angina is very bad discomfort or pain in the chest, neck, arm, neck, or back.  Symptoms include chest pain, heartburn or upset stomach for no reason, and shortness of breath.  Women or  people with diabetes may have symptoms that are not usual, such as feeling nervous or worried for no reason, weak for no reason, or tired.  Take all medicines only as told by your doctor.  You should eat a heart-healthy diet and follow an exercise program. This information is not intended to replace advice given to you by your health care provider. Make sure you discuss any questions you have with your health care provider. Document Revised: 10/16/2017 Document Reviewed: 10/16/2017 Elsevier Patient Education  [place].

## 2019-07-26 NOTE — Progress Notes (Signed)
Patient ambulated post cath without difficulty and pain and groin site stable. bandaid applied

## 2019-07-27 NOTE — Discharge Summary (Signed)
NAME: [name] [name], [name] [name] MEDICAL RECORD [id number] ACCOUNT [id number] DATE OF BIRTH:21-May-1950 FACILITY: [facility] LOCATION: [facility] PHYSICIAN:Keajah Killough Daivd Council, MD  DISCHARGE SUMMARY  DATE OF DISCHARGE:  07/26/2019  ADMITTING DIAGNOSES: 1.  Acute coronary syndrome. 2.  Dizziness. 3.  Coronary artery disease. 4.  Hypertension. 5.  Hyperlipidemia. 6.  Chronic kidney disease stage III.  FINAL DIAGNOSES: 1.  Stable angina, positive nuclear stress test, status post cardiac catheterization, noted to have mild to moderate CAD with patent LAD stent. 2.  Coronary artery disease, history of PCI to LAD in the past. 3.  Hypertension. 4.  Hyperlipidemia. 5.  Chronic kidney disease stage III. 6.  History of tobacco abuse. 7.  History of hepatitis in the past. 8.  History of colon cancer.  DISCHARGE HOME MEDICATIONS: 1.  Amlodipine 2.5 mg daily. 2.  Entresto 24/26 mg 1 tablet twice daily. 3.  Albuterol inhaler 2 puffs every six hours as needed. 4.  Aspirin 81 mg daily. 5.  Atorvastatin 80 mg daily. 6.  Clopidogrel 75 mg daily. 7.  Nitrostat 0.4 mg sublingual p.r.n. 8.  Nitroglycerin patch 0.4 mg per hour daily. 9.  Metoprolol succinate 25 mg daily.  The patient has been advised to stop spironolactone and reduce the Entresto as above.  DIET:  Low salt, low cholesterol.  ACTIVITY:  As tolerated.  Post-cardiac catheterization instructions have been given.  CONDITION AT DISCHARGE:  Stable.  Follow up with me early next week.  BRIEF HISTORY AND HOSPITAL COURSE:  The patient is a 69 year old male with past medical history significant for coronary artery disease, status post PTCA stenting to mid LAD, hypertension, hyperlipidemia, chronic kidney disease stage II, hepatitis C,  history of colon cancer with surgery in 2002 had chest pain with increased sublingual nitro use and near syncopal episode for the last few days.  The patient currently is chest pain free.  First set of high sensitivity  troponin I is normal.  Chest x-ray  was unremarkable.  Creatinine was elevated to 2.03.  EKG showed normal sinus rhythm with poor R-wave progression as before.  The patient was admitted by Dr. Doylene Canard.   PHYSICAL EXAMINATION: GENERAL:  He was alert, awake, oriented x3. VITAL SIGNS:  Blood pressure was 125/76, pulse 51.  He was afebrile. HEENT:  Conjunctivae pink. NECK:  Supple, no JVD, no bruit. LUNGS:  Clear to auscultation bilaterally. CARDIOVASCULAR:  Bradycardic, S1, S2 was normal.  There was II/VI systolic murmur.  No click, rub, or gallop. ABDOMEN:  Soft.  Bowel sounds are present and normal.  No organomegaly. EXTREMITIES:  No clubbing, cyanosis or edema. NEUROLOGIC:  Alert, awake, oriented x3.  No focal deficits.  LABORATORY DATA:  Sodium was 139, potassium 4.2, BUN 17, creatinine 2.03.  Last electrolytes, sodium 137, potassium 4.1, glucose 96, BUN 13, creatinine 1.45.  His cholesterol was 114, HDL 43, LDL 63, triglycerides of 42.  Hemoglobin was 14.2, hematocrit  44.2, white count of 8.3.  Two sets of high sensitivity troponin I were normal, 7 and 7.  HIV screen was nonreactive.    Chest x-ray showed no evidence of acute cardiopulmonary abnormality.  The patient underwent a Lexiscan Myoview, which showed inferior and apical infarction with minimal peri-infarct ischemia involving the apex similar to 04/2018.  Global hypokinesia.   Left ventricular ejection fraction 46%.  Noninvasive risk stratification high.  BRIEF HOSPITAL COURSE:  The patient was admitted to telemetry unit.  MI was ruled out by serial enzymes and EKG.  The patient had recurrent episodes  of chest pain during the hospital stay requiring sublingual nitro with relief of chest pain.  The patient  initially opted for medical management, but agreed for cardiac catheterization and possible PCI due to recurrent chest pain.  The patient subsequently underwent a left cardiac catheterization today as per procedure report.  The  patient tolerated the  procedure well and was noted to have patent LAD stent.  The patient did not have any further episodes of chest pain during the hospital stay.  His groin is stable with no evidence of hematoma or bruit.  The patient will be discharged home on above  medications and will be followed up in my office early next week.  Post-cardiac catheterization instructions have been given.  AN/NUANCE D:07/26/2019 T:07/27/2019 JOB:[id number]

## 2019-12-05 ENCOUNTER — Other Ambulatory Visit: Payer: Self-pay | Admitting: General Practice

## 2019-12-05 ENCOUNTER — Encounter: Payer: Self-pay | Admitting: Gastroenterology

## 2019-12-05 DIAGNOSIS — F17210 Nicotine dependence, cigarettes, uncomplicated: Secondary | ICD-10-CM

## 2019-12-18 ENCOUNTER — Ambulatory Visit: Payer: Medicare Other

## 2019-12-26 ENCOUNTER — Ambulatory Visit: Payer: Medicare Other

## 2020-01-08 ENCOUNTER — Encounter: Payer: Self-pay | Admitting: Gastroenterology

## 2020-01-08 ENCOUNTER — Ambulatory Visit (INDEPENDENT_AMBULATORY_CARE_PROVIDER_SITE_OTHER): Payer: Medicare Other | Admitting: Gastroenterology

## 2020-01-08 ENCOUNTER — Telehealth: Payer: Self-pay | Admitting: *Deleted

## 2020-01-08 VITALS — BP 130/80 | HR 60 | Ht 71.0 in | Wt 216.0 lb

## 2020-01-08 DIAGNOSIS — Z85038 Personal history of other malignant neoplasm of large intestine: Secondary | ICD-10-CM | POA: Diagnosis not present

## 2020-01-08 DIAGNOSIS — Z7902 Long term (current) use of antithrombotics/antiplatelets: Secondary | ICD-10-CM | POA: Insufficient documentation

## 2020-01-08 MED ORDER — SUTAB 1479-225-188 MG PO TABS: 1.0000 | ORAL_TABLET | Freq: Once | ORAL | 0 refills | Status: AC

## 2020-01-08 NOTE — Patient Instructions (Addendum)
If you are age 67 or older, your body mass index should be between 23-30. Your Body mass index is 30.13 kg/m. If this is out of the aforementioned range listed, please consider follow up with your Primary Care Provider.  If you are age 56 or younger, your body mass index should be between 19-25. Your Body mass index is 30.13 kg/m. If this is out of the aformentioned range listed, please consider follow up with your Primary Care Provider.   You have been scheduled for a colonoscopy. Please follow written instructions given to you at your visit today.  Please pick up your prep supplies at the pharmacy within the next 1-3 days. If you use inhalers (even only as needed), please bring them with you on the day of your procedure.  Due to recent changes in healthcare laws, you may see the results of your imaging and laboratory studies on MyChart before your provider has had a chance to review them.  We understand that in some cases there may be results that are confusing or concerning to you. Not all laboratory results come back in the same time frame and the provider may be waiting for multiple results in order to interpret others.  Please give [place] 48 hours in order for your provider to thoroughly review all the results before contacting the office for clarification of your results.

## 2020-01-08 NOTE — Progress Notes (Signed)
Reviewed and agree with management plans. ? ?Joseff Luckman L. Filomeno Cromley, MD, MPH  ?

## 2020-01-08 NOTE — Progress Notes (Signed)
01/08/2020 [name] [name] [id number] Nov 08, 1950   HISTORY OF PRESENT ILLNESS: This is a 69 year old male who is previously a patient of Dr. Buel Ream.  He has a history of colon cancer in 2002.  His last colonoscopy was in December 2014 at which time he had one polyp removed, but pathology is not available.  He is here today to repeat colonoscopy.  He is on Plavix for coronary artery disease and stents.  His cardiologist is Dr. Terrence Dupont.  He denies any GI complaints.  Says that he moves his bowels well for the most part.  Denies any rectal bleeding or persistent abdominal pains.   Past Medical History:  Diagnosis Date  . Anginal pain ([place])   . Cancer ([place])    colon  . Colon cancer ([place])   . Coronary artery disease   . Hepatitis    c  . Hyperlipidemia   . Hypertension   . Osteoarthritis of left knee 06/21/2011  . Shortness of breath    Past Surgical History:  Procedure Laterality Date  . CARDIAC CATHETERIZATION  12/31/2015  . CARDIAC CATHETERIZATION N/A 12/31/2015   Procedure: Left Heart Cath and Coronary Angiography;  Surgeon: Charolette Forward, MD;  Location: [facility];  Service: Cardiovascular;  Laterality: N/A;  . CARDIAC CATHETERIZATION N/A 12/31/2015   Procedure: Coronary Balloon Angioplasty;  Surgeon: Charolette Forward, MD;  Location: [facility];  Service: Cardiovascular;  Laterality: N/A;  . COLON SURGERY     2002   colon  ca   dr Bubba Camp      chemo  tx  . JOINT REPLACEMENT     rt knee  . LEFT HEART CATH AND CORONARY ANGIOGRAPHY N/A 07/26/2019   Procedure: LEFT HEART CATH AND CORONARY ANGIOGRAPHY;  Surgeon: Charolette Forward, MD;  Location: [facility];  Service: Cardiovascular;  Laterality: N/A;  . LEFT HEART CATHETERIZATION WITH CORONARY ANGIOGRAM N/A 11/24/2011   Procedure: LEFT HEART CATHETERIZATION WITH CORONARY ANGIOGRAM;  Surgeon: Clent Demark, MD;  Location: [facility];  Service: Cardiovascular;  Laterality: N/A;  . PERCUTANEOUS CORONARY STENT  INTERVENTION (PCI-S)  11/24/2011   Procedure: PERCUTANEOUS CORONARY STENT INTERVENTION (PCI-S);  Surgeon: Clent Demark, MD;  Location: [facility];  Service: Cardiovascular;;  . REPLACEMENT TOTAL KNEE     bilateral knee replacements    reports that he has been smoking cigarettes. He has a 1.75 pack-year smoking history. He has never used smokeless tobacco. He reports current alcohol use of about 4.0 standard drinks of alcohol per week. He reports that he does not use drugs. family history includes Hypertension in his mother. Allergies  Allergen Reactions  . Acyclovir And Related   . Sulfonamide Derivatives Rash and Other (See Comments)    REACTION: penile irritation      Outpatient Encounter Medications as of 01/08/2020  Medication Sig  . amLODipine (NORVASC) 2.5 MG tablet Take 1 tablet (2.5 mg total) by mouth daily.  Marland Kitchen aspirin EC 81 MG EC tablet Take 1 tablet (81 mg total) by mouth daily.  Marland Kitchen atorvastatin (LIPITOR) 80 MG tablet Take 80 mg by mouth daily.  . clopidogrel (PLAVIX) 75 MG tablet Take 75 mg by mouth daily.  . metoprolol succinate (TOPROL-XL) 25 MG 24 hr tablet Take 1 tablet (25 mg total) by mouth daily.  . nitroGLYCERIN (NITRODUR - DOSED IN MG/24 HR) 0.4 mg/hr patch Place 0.4 mg onto the skin daily.  . nitroGLYCERIN (NITROSTAT) 0.4 MG SL tablet Place 1 tablet (  0.4 mg total) under the tongue every 5 (five) minutes as needed for chest pain.  . sacubitril-valsartan (ENTRESTO) 24-26 MG Take 1 tablet by mouth 2 (two) times daily.  Marland Kitchen albuterol (PROVENTIL HFA;VENTOLIN HFA) 108 (90 Base) MCG/ACT inhaler Inhale 1-2 puffs into the lungs every 6 (six) hours as needed for wheezing or shortness of breath. (Patient not taking: Reported on 01/08/2020)   No facility-administered encounter medications on file as of 01/08/2020.     REVIEW OF SYSTEMS  : All other systems reviewed and negative except where noted in the History of Present Illness.   PHYSICAL EXAM: BP 130/80   Pulse 60    Ht 5\' 11"  (1.803 m)   Wt 216 lb (98 kg)   SpO2 94%   BMI 30.13 kg/m  General: Well developed AA male in no acute distress Head: Normocephalic and atraumatic Eyes:  Sclerae anicteric, conjunctiva pink. Ears: Normal auditory acuity Lungs: Clear throughout to auscultation; no W/R/R. Heart: Regular rate and rhythm; no M/R/G. Abdomen: Soft, non-distended.  BS present.  Non-tender.  Midline scar noted from previous surgery. Rectal:  Will be done at the time of colonoscopy. Musculoskeletal: Symmetrical with no gross deformities  Skin: No lesions on visible extremities Extremities: No edema  Neurological: Alert oriented x 4, grossly non-focal Psychological:  Alert and cooperative. Normal mood and affect  ASSESSMENT AND PLAN: *Personal history of colon cancer in 2002: Last colonoscopy in December 2014 with a polyp removed, but there is no pathology available.  He is already scheduled for colonoscopy in November with Dr. Tarri Glenn. *Chronic antiplatelet use with Plavix due to coronary artery disease with stents:  Hold Plavix for 5 days before procedure - will instruct when and how to resume after procedure. Risks and benefits of procedure including bleeding, perforation, infection, missed lesions, medication reactions and possible hospitalization or surgery if complications occur explained. Additional rare but real risk of cardiovascular event such as heart attack or ischemia/infarct of other organs off Plavix explained and need to seek urgent help if this occurs. Will communicate by phone or EMR with patient's prescribing provider, Dr. Terrence Dupont, to confirm that holding Plavix is reasonable in this case.    CC:  Nolene Ebbs, MD

## 2020-01-08 NOTE — Telephone Encounter (Signed)
Faxed letter to Dr. Terrence Dupont for Plavix clearance.

## 2020-01-10 ENCOUNTER — Ambulatory Visit: Payer: Medicare Other

## 2020-01-10 NOTE — Telephone Encounter (Signed)
Received clearance approval from Dr. Terrence Dupont for patient to hold Plavix 5 days prior to procedure. Patient informed and voiced understanding.

## 2020-01-23 ENCOUNTER — Ambulatory Visit
Admission: RE | Admit: 2020-01-23 | Discharge: 2020-01-23 | Disposition: A | Discharge status: Home / Self Care | Payer: Medicare Other | Source: Ambulatory Visit | Attending: General Practice | Admitting: General Practice

## 2020-01-23 ENCOUNTER — Other Ambulatory Visit: Payer: Self-pay

## 2020-01-23 DIAGNOSIS — F17210 Nicotine dependence, cigarettes, uncomplicated: Secondary | ICD-10-CM

## 2020-01-29 ENCOUNTER — Encounter: Payer: Self-pay | Admitting: Gastroenterology

## 2020-01-31 ENCOUNTER — Other Ambulatory Visit: Payer: Self-pay

## 2020-01-31 ENCOUNTER — Ambulatory Visit (AMBULATORY_SURGERY_CENTER): Payer: Medicare Other | Admitting: Gastroenterology

## 2020-01-31 ENCOUNTER — Encounter: Payer: Self-pay | Admitting: Gastroenterology

## 2020-01-31 VITALS — BP 114/51 | HR 53 | Temp 97.1°F | Resp 24 | Ht 71.0 in | Wt 216.0 lb

## 2020-01-31 DIAGNOSIS — D122 Benign neoplasm of ascending colon: Secondary | ICD-10-CM

## 2020-01-31 DIAGNOSIS — D124 Benign neoplasm of descending colon: Secondary | ICD-10-CM | POA: Diagnosis not present

## 2020-01-31 DIAGNOSIS — Z86010 Personal history of colonic polyps: Secondary | ICD-10-CM

## 2020-01-31 DIAGNOSIS — D123 Benign neoplasm of transverse colon: Secondary | ICD-10-CM

## 2020-01-31 DIAGNOSIS — Z85038 Personal history of other malignant neoplasm of large intestine: Secondary | ICD-10-CM

## 2020-01-31 MED ORDER — SODIUM CHLORIDE 0.9 % IV SOLN: 500.0000 mL | Freq: Once | INTRAVENOUS | Status: DC

## 2020-01-31 NOTE — Progress Notes (Signed)
To PACU, VSS. Report to [email or web address] 

## 2020-01-31 NOTE — Op Note (Addendum)
[facility] Patient Name: [name] [name] Procedure Date: 01/31/2020 11:12 AM MRN: [id number] Endoscopist: Thornton Park MD, MD Age: 69 Referring MD:  Date of Birth: 05-Feb-1951 Gender: Male Account #: [id number] Procedure:                Colonoscopy Indications:              High risk colon cancer surveillance: Personal                            history of colonic polyps                           Polyp removed on colonoscopy with Dr. Sharlett Iles                            2014 (path results not available)                           No known family history of colon cancer or polyps Medicines:                Monitored Anesthesia Care Procedure:                Pre-Anesthesia Assessment:                           - Prior to the procedure, a History and Physical                            was performed, and patient medications and                            allergies were reviewed. The patient's tolerance of                            previous anesthesia was also reviewed. The risks                            and benefits of the procedure and the sedation                            options and risks were discussed with the patient.                            All questions were answered, and informed consent                            was obtained. Prior Anticoagulants: The patient has                            taken Plavix (clopidogrel), last dose was 5 days                            prior to procedure. ASA Grade Assessment: III - A  patient with severe systemic disease. After                            reviewing the risks and benefits, the patient was                            deemed in satisfactory condition to undergo the                            procedure.                           After obtaining informed consent, the colonoscope                            was passed under direct vision. Throughout the                            procedure,  the patient's blood pressure, pulse, and                            oxygen saturations were monitored continuously. The                            Colonoscope was introduced through the anus and                            advanced to the 3 cm into the ileum. The                            colonoscopy was performed without difficulty. The                            patient tolerated the procedure well. The quality                            of the bowel preparation was good. The terminal                            ileum, ileocecal valve, appendiceal orifice, and                            rectum were photographed. Scope In: 11:24:49 AM Scope Out: 11:37:46 AM Scope Withdrawal Time: 0 hours 11 minutes 9 seconds  Total Procedure Duration: 0 hours 12 minutes 57 seconds  Findings:                 The perianal and digital rectal examinations were                            normal.                           A 5 mm polyp was found in the descending colon. The  polyp was semi-pedunculated. The polyp was removed                            with a cold snare. Resection and retrieval were                            complete. Estimated blood loss was minimal.                           A 2 mm polyp was found in the transverse colon. The                            polyp was flat. The polyp was removed with a cold                            snare. Resection and retrieval were complete.                            Estimated blood loss was minimal.                           Two sessile polyps were found in the ascending                            colon. The polyps were 4 to 10 mm in size. These                            polyps were removed with a cold snare. Resection                            and retrieval were complete. Estimated blood loss                            was minimal.                           The exam was otherwise without abnormality on                             direct and retroflexion views. Complications:            No immediate complications. Estimated blood loss:                            Minimal. Estimated Blood Loss:     Estimated blood loss was minimal. Impression:               - One 5 mm polyp in the descending colon, removed                            with a cold snare. Resected and retrieved.                           - One 2 mm polyp in the transverse  colon, removed                            with a cold snare. Resected and retrieved.                           - Two 4 to 10 mm polyps in the ascending colon,                            removed with a cold snare. Resected and retrieved.                           - The examination was otherwise normal on direct                            and retroflexion views. Recommendation:           - Patient has a contact number available for                            emergencies. The signs and symptoms of potential                            delayed complications were discussed with the                            patient. Return to normal activities tomorrow.                            Written discharge instructions were provided to the                            patient.                           - Resume previous diet.                           - Continue present medications.                           - Resume Plavix tomorrow.                           - Await pathology results.                           - Repeat colonoscopy date to be determined after                            pending pathology results are reviewed for                            surveillance.                           - Emerging evidence supports eating a  diet of                            fruits, vegetables, grains, calcium, and yogurt                            while reducing red meat and alcohol may reduce the                            risk of colon cancer.                           - Thank you for allowing me to be  involved in your                            colon cancer prevention. Thornton Park MD, MD 01/31/2020 11:43:14 AM This report has been signed electronically.

## 2020-01-31 NOTE — Patient Instructions (Signed)
YOU HAD AN ENDOSCOPIC PROCEDURE TODAY AT THE [facility]:   Refer to the procedure report that was given to you for any specific questions about what was found during the examination.  If the procedure report does not answer your questions, please call your gastroenterologist to clarify.  If you requested that your care partner not be given the details of your procedure findings, then the procedure report has been included in a sealed envelope for you to review at your convenience later.  YOU SHOULD EXPECT: Some feelings of bloating in the abdomen. Passage of more gas than usual.  Walking can help get rid of the air that was put into your GI tract during the procedure and reduce the bloating. If you had a lower endoscopy (such as a colonoscopy or flexible sigmoidoscopy) you may notice spotting of blood in your stool or on the toilet paper. If you underwent a bowel prep for your procedure, you may not have a normal bowel movement for a few days.  Please Note:  You might notice some irritation and congestion in your nose or some drainage.  This is from the oxygen used during your procedure.  There is no need for concern and it should clear up in a day or so.  SYMPTOMS TO REPORT IMMEDIATELY:   Following lower endoscopy (colonoscopy or flexible sigmoidoscopy):  Excessive amounts of blood in the stool  Significant tenderness or worsening of abdominal pains  Swelling of the abdomen that is new, acute  Fever of 100F or higher  For urgent or emergent issues, a gastroenterologist can be reached at any hour by calling [phone]. Do not use MyChart messaging for urgent concerns.    DIET:  We do recommend a small meal at first, but then you may proceed to your regular diet.  Drink plenty of fluids but you should avoid alcoholic beverages for 24 hours.  ACTIVITY:  You should plan to take it easy for the rest of today and you should NOT DRIVE or use heavy machinery until tomorrow (because  of the sedation medicines used during the test).    FOLLOW UP: Our staff will call the number listed on your records 48-72 hours following your procedure to check on you and address any questions or concerns that you may have regarding the information given to you following your procedure. If we do not reach you, we will leave a message.  We will attempt to reach you two times.  During this call, we will ask if you have developed any symptoms of COVID 19. If you develop any symptoms (ie: fever, flu-like symptoms, shortness of breath, cough etc.) before then, please call [phone].  If you test positive for Covid 19 in the 2 weeks post procedure, please call and report this information to us.    If any biopsies were taken you will be contacted by phone or by letter within the next 1-3 weeks.  Please call us at [phone] if you have not heard about the biopsies in 3 weeks.    SIGNATURES/CONFIDENTIALITY: You and/or your care partner have signed paperwork which will be entered into your electronic medical record.  These signatures attest to the fact that that the information above on your After Visit Summary has been reviewed and is understood.  Full responsibility of the confidentiality of this discharge information lies with you and/or your care-partner. 

## 2020-01-31 NOTE — Progress Notes (Signed)
Called to room to assist during endoscopic procedure.  Patient ID and intended procedure confirmed with present staff. Received instructions for my participation in the procedure from the performing physician.  

## 2020-02-04 ENCOUNTER — Telehealth: Payer: Self-pay | Admitting: *Deleted

## 2020-02-04 ENCOUNTER — Telehealth: Payer: Self-pay

## 2020-02-04 NOTE — Telephone Encounter (Signed)
First post procedure follow up call, no answer 

## 2020-02-04 NOTE — Telephone Encounter (Signed)
Poor connection for second follow up call attempt.

## 2020-02-10 ENCOUNTER — Encounter: Payer: Self-pay | Admitting: Gastroenterology

## 2020-11-02 ENCOUNTER — Ambulatory Visit
Admission: RE | Admit: 2020-11-02 | Discharge: 2020-11-02 | Disposition: A | Discharge status: Home / Self Care | Payer: Medicare Other | Source: Ambulatory Visit | Attending: Family Medicine | Admitting: Family Medicine

## 2020-11-02 ENCOUNTER — Other Ambulatory Visit: Payer: Self-pay | Admitting: Family Medicine

## 2020-11-02 ENCOUNTER — Other Ambulatory Visit: Payer: Self-pay

## 2020-11-02 DIAGNOSIS — R0602 Shortness of breath: Secondary | ICD-10-CM

## 2021-05-03 ENCOUNTER — Encounter (HOSPITAL_COMMUNITY): Payer: Self-pay

## 2021-05-03 ENCOUNTER — Ambulatory Visit (HOSPITAL_COMMUNITY)
Admission: RE | Admit: 2021-05-03 | Discharge: 2021-05-03 | Disposition: A | Discharge status: Home / Self Care | Payer: Medicare Other | Source: Ambulatory Visit | Attending: Physician Assistant | Admitting: Physician Assistant

## 2021-05-03 ENCOUNTER — Ambulatory Visit (INDEPENDENT_AMBULATORY_CARE_PROVIDER_SITE_OTHER): Payer: Medicare Other

## 2021-05-03 ENCOUNTER — Other Ambulatory Visit: Payer: Self-pay

## 2021-05-03 VITALS — BP 133/73 | HR 64 | Temp 98.2°F | Resp 17

## 2021-05-03 DIAGNOSIS — J329 Chronic sinusitis, unspecified: Secondary | ICD-10-CM

## 2021-05-03 DIAGNOSIS — J40 Bronchitis, not specified as acute or chronic: Secondary | ICD-10-CM

## 2021-05-03 DIAGNOSIS — R051 Acute cough: Secondary | ICD-10-CM

## 2021-05-03 DIAGNOSIS — R059 Cough, unspecified: Secondary | ICD-10-CM | POA: Diagnosis not present

## 2021-05-03 MED ORDER — DOXYCYCLINE HYCLATE 100 MG PO CAPS: 100.0000 mg | ORAL_CAPSULE | Freq: Two times a day (BID) | ORAL | 0 refills | Status: DC

## 2021-05-03 MED ORDER — PREDNISONE 20 MG PO TABS: 40.0000 mg | ORAL_TABLET | Freq: Every day | ORAL | 0 refills | Status: AC

## 2021-05-03 NOTE — ED Triage Notes (Signed)
Pt presents with a cough, chest congestion and SOB x Thursday.  States he has been taking mucinex.

## 2021-05-03 NOTE — ED Provider Notes (Signed)
[place]    CSN: [id number] Arrival date & time: 05/03/21  0844      History   Chief Complaint Chief Complaint  Patient presents with   Appointment    HPI [name] [name] is a 71 y.o. male.   Patient presents today with a year-long history of intermittent shortness of breath and chest congestion with current episode starting within the past few weeks.  He has been taking Mucinex and Coricidin without improvement of symptoms.  Reports productive cough and shortness of breath.  Denies any fever, headache, dizziness, nausea, vomiting, chest pain.  He denies history of chronic lung condition.  He is a social smoker but reports smoking infrequently.  He has albuterol sent on his chart but reports that he does not use this medication regularly.  He has taken Z-Pak's within the past 90 days which have been ineffective in managing symptoms.  Denies additional antibiotic use.  He denies history of diabetes.  He is up-to-date on influenza, pneumonia, COVID-19 vaccination.  He had COVID approximately 1 year ago.   Past Medical History:  Diagnosis Date   Anginal pain ([place])    Cancer ([place])    colon   Clotting disorder ([place])    blood clot in neck   Colon cancer ([place])    Coronary artery disease    Hepatitis    c   Hyperlipidemia    Hypertension    Osteoarthritis of left knee 06/21/2011   Shortness of breath     Patient Active Problem List   Diagnosis Date Noted   Antiplatelet or antithrombotic long-term use 01/08/2020   Acute coronary syndrome ([place]) 07/22/2019   Cancer ([place])    Unstable angina ([place]) 12/31/2015   History of colon cancer 10/30/2014   ACS (acute coronary syndrome) ([place]) 08/02/2013   Urinary frequency 10/19/2011   Acute blood loss anemia 06/22/2011   Hyponatremia 06/22/2011   Osteoarthritis of left knee 06/21/2011   Primary osteoarthritis of left knee 01/17/2011   CERUMEN IMPACTION, RIGHT 08/19/2009   SEBACEOUS CYST, INFECTED 10/01/2007   CARBUNCLE  AND FURUNCLE OF TRUNK 09/27/2007   NUMBNESS 09/27/2007   MUSCLE SPASM, TRAPEZIUS MUSCLE, RIGHT 09/05/2007   Acute hepatitis C virus infection 05/29/2007   HYPERLIPIDEMIA 05/29/2007   HYPERTENSION 05/29/2007   HYPERTENSION, BENIGN 05/02/2007   CORONARY ARTERY DISEASE 03/14/2001    Past Surgical History:  Procedure Laterality Date   CARDIAC CATHETERIZATION  12/31/2015   CARDIAC CATHETERIZATION N/A 12/31/2015   Procedure: Left Heart Cath and Coronary Angiography;  Surgeon: Charolette Forward, MD;  Location: [facility];  Service: Cardiovascular;  Laterality: N/A;   CARDIAC CATHETERIZATION N/A 12/31/2015   Procedure: Coronary Balloon Angioplasty;  Surgeon: Charolette Forward, MD;  Location: [facility];  Service: Cardiovascular;  Laterality: N/A;   COLON SURGERY     2002   colon  ca   dr Bubba Camp      chemo  tx   JOINT REPLACEMENT     rt knee   LEFT HEART CATH AND CORONARY ANGIOGRAPHY N/A 07/26/2019   Procedure: LEFT HEART CATH AND CORONARY ANGIOGRAPHY;  Surgeon: Charolette Forward, MD;  Location: [facility];  Service: Cardiovascular;  Laterality: N/A;   LEFT HEART CATHETERIZATION WITH CORONARY ANGIOGRAM N/A 11/24/2011   Procedure: LEFT HEART CATHETERIZATION WITH CORONARY ANGIOGRAM;  Surgeon: Clent Demark, MD;  Location: [facility];  Service: Cardiovascular;  Laterality: N/A;   PERCUTANEOUS CORONARY STENT INTERVENTION (PCI-S)  11/24/2011   Procedure: PERCUTANEOUS CORONARY  STENT INTERVENTION (PCI-S);  Surgeon: Clent Demark, MD;  Location: [facility] C[facility];  Service: Cardiovascular;;   REPLACEMENT TOTAL KNEE     bilateral knee replacements       Home Medications    Prior to Admission medications   Medication Sig Start Date End Date Taking? Authorizing Provider  doxycycline (VIBRAMYCIN) 100 MG capsule Take 1 capsule (100 mg total) by mouth 2 (two) times daily. 05/03/21  Yes Rorey Hodges K, PA-C  predniSONE (DELTASONE) 20 MG tablet Take 2 tablets (40 mg total) by mouth daily for 4  days. 05/03/21 05/07/21 Yes Ashlynn Gunnels K, PA-C  albuterol (PROVENTIL HFA;VENTOLIN HFA) 108 (90 Base) MCG/ACT inhaler Inhale 1-2 puffs into the lungs every 6 (six) hours as needed for wheezing or shortness of breath. Patient not taking: Reported on 01/08/2020    [provider]  amLODipine (NORVASC) 2.5 MG tablet Take 1 tablet (2.5 mg total) by mouth daily. 07/25/19 07/24/20  Charolette Forward, MD  aspirin EC 81 MG EC tablet Take 1 tablet (81 mg total) by mouth daily. 08/04/13   Dixie Dials, MD  atorvastatin (LIPITOR) 80 MG tablet Take 80 mg by mouth daily. 07/08/19   [provider]  clopidogrel (PLAVIX) 75 MG tablet Take 75 mg by mouth daily. 07/08/19   [provider]  metoprolol succinate (TOPROL-XL) 25 MG 24 hr tablet Take 1 tablet (25 mg total) by mouth daily. 07/27/19   Charolette Forward, MD  nitroGLYCERIN (NITRODUR - DOSED IN MG/24 HR) 0.4 mg/hr patch Place 0.4 mg onto the skin daily. 07/08/19   [provider]  nitroGLYCERIN (NITROSTAT) 0.4 MG SL tablet Place 1 tablet (0.4 mg total) under the tongue every 5 (five) minutes as needed for chest pain. 01/01/16   Charolette Forward, MD  sacubitril-valsartan (ENTRESTO) 24-26 MG Take 1 tablet by mouth 2 (two) times daily. 07/26/19   Charolette Forward, MD    Family History Family History  Problem Relation Age of Onset   Hypertension Mother    Colon cancer Neg Hx    Esophageal cancer Neg Hx    Rectal cancer Neg Hx    Stomach cancer Neg Hx     Social History Social History   Tobacco Use   Smoking status: Some Days    Packs/day: 0.25    Years: 7.00    Pack years: 1.75    Types: Cigarettes   Smokeless tobacco: Never  Vaping Use   Vaping Use: Never used  Substance Use Topics   Alcohol use: Yes    Alcohol/week: 4.0 standard drinks    Types: 4 Cans of beer per week    Comment: oct   2012   Drug use: No     Allergies   Acyclovir and related and Sulfonamide derivatives   Review of Systems Review of Systems   Constitutional:  Positive for activity change. Negative for appetite change, fatigue and fever.  HENT:  Negative for congestion, sinus pressure, sneezing and sore throat.   Respiratory:  Positive for cough and shortness of breath. Negative for chest tightness.   Cardiovascular:  Negative for chest pain.  Gastrointestinal:  Negative for abdominal pain, diarrhea, nausea and vomiting.  Neurological:  Negative for dizziness, light-headedness and headaches.    Physical Exam Triage Vital Signs ED Triage Vitals  Enc Vitals Group     BP 05/03/21 0931 133/73     Pulse Rate 05/03/21 0931 64     Resp 05/03/21 0931 17     Temp 05/03/21 0933 98.2  F (36.8 C)     Temp Source 05/03/21 0933 Oral     SpO2 05/03/21 0931 100 %     Weight --      Height --      Head Circumference --      Peak Flow --      Pain Score 05/03/21 0930 3     Pain Loc --      Pain Edu? --      Excl. in [place]? --    No data found.  Updated Vital Signs BP 133/73 (BP Location: Left Arm)    Pulse 64    Temp 98.2 F (36.8 C) (Oral)    Resp 17    SpO2 100%   Visual Acuity Right Eye Distance:   Left Eye Distance:   Bilateral Distance:    Right Eye Near:   Left Eye Near:    Bilateral Near:     Physical Exam Vitals reviewed.  Constitutional:      General: He is awake.     Appearance: Normal appearance. He is well-developed. He is not ill-appearing.     Comments: Very pleasant male appears stated age no acute distress sitting comfortably in exam room  HENT:     Head: Normocephalic and atraumatic.     Right Ear: Tympanic membrane, ear canal and external ear normal. Tympanic membrane is not erythematous or bulging.     Left Ear: Tympanic membrane, ear canal and external ear normal. Tympanic membrane is not erythematous or bulging.     Nose: Nose normal.     Mouth/Throat:     Pharynx: Uvula midline. Posterior oropharyngeal erythema present. No oropharyngeal exudate.  Cardiovascular:     Rate and Rhythm: Normal rate  and regular rhythm.     Heart sounds: Normal heart sounds, S1 normal and S2 normal. No murmur heard. Pulmonary:     Effort: Pulmonary effort is normal. No accessory muscle usage or respiratory distress.     Breath sounds: No stridor. Decreased breath sounds present. No wheezing, rhonchi or rales.  Neurological:     Mental Status: He is alert.  Psychiatric:        Behavior: Behavior is cooperative.     UC Treatments / Results  Labs (all labs ordered are listed, but only abnormal results are displayed) Labs Reviewed - No data to display  EKG   Radiology DG Chest 2 View  Result Date: 05/03/2021 CLINICAL DATA:  Cough EXAM: CHEST - 2 VIEW COMPARISON:  11/02/2020 FINDINGS: The heart size and mediastinal contours are within normal limits. No focal airspace consolidation, pleural effusion, or pneumothorax. The visualized skeletal structures are unremarkable. IMPRESSION: No active cardiopulmonary disease. Electronically Signed   By: Davina Poke D.O.   On: 05/03/2021 10:08    Procedures Procedures (including critical care time)  Medications Ordered in UC Medications - No data to display  Initial Impression / Assessment and Plan / UC Course  I have reviewed the triage vital signs and the nursing notes.  Pertinent labs & imaging results that were available during my care of the patient were reviewed by me and considered in my medical decision making (see chart for details).     Patient is well-appearing, nontachycardic, afebrile, nontoxic.  Chest x-ray was obtained which showed no acute cardiopulmonary disease.  Given prolonged and worsening symptoms concern for COPD exacerbation versus sinobronchitis.  Will cover with antibiotic and he was started on doxycycline with instruction to avoid prolonged sun exposure due to photosensitivity associated  with this medication.  We will start prednisone burst and he was instructed to avoid NSAIDs.  Recommended Tylenol, Mucinex, Flonase for  symptom relief.  He is to rest and drink plenty of fluid.  He was provided work excuse note.  Discussed that if symptoms or not improving by the end of the week he should return here or see your PCP.  Discussed alarm symptoms that warrant emergent evaluation.  Strict return precautions given to which he expressed understanding.  Final Clinical Impressions(s) / UC Diagnoses   Final diagnoses:  Sinobronchitis  Acute cough     Discharge Instructions      Your chest x-ray was normal.  We are going to cover you with steroids and an antibiotic.  Take doxycycline 100 mg twice daily.  Stay out of the sun while on this medication.  Take prednisone 40 mg in the morning for 4 days.  You should not take NSAIDs including aspirin, ibuprofen/Advil, naproxen/Aleve with this medication as it can cause stomach bleeding.  You can take Tylenol, Mucinex, Flonase for additional symptom relief.  Rest and drink plenty of fluid.  If your symptoms not improving by next week please return here or see your PCP.  If anything worsens and you have high fever, chest pain, shortness of breath, nausea/vomiting interfering with oral intake, weakness you need to go to the emergency room.     ED Prescriptions     Medication Sig Dispense Auth. Provider   doxycycline (VIBRAMYCIN) 100 MG capsule Take 1 capsule (100 mg total) by mouth 2 (two) times daily. 20 capsule Channin Agustin K, PA-C   predniSONE (DELTASONE) 20 MG tablet Take 2 tablets (40 mg total) by mouth daily for 4 days. 8 tablet Ocean Kearley, Derry Skill, PA-C      PDMP not reviewed this encounter.   Terrilee Croak, PA-C 05/03/21 1019

## 2021-05-03 NOTE — Discharge Instructions (Signed)
Your chest x-ray was normal.  We are going to cover you with steroids and an antibiotic.  Take doxycycline 100 mg twice daily.  Stay out of the sun while on this medication.  Take prednisone 40 mg in the morning for 4 days.  You should not take NSAIDs including aspirin, ibuprofen/Advil, naproxen/Aleve with this medication as it can cause stomach bleeding.  You can take Tylenol, Mucinex, Flonase for additional symptom relief.  Rest and drink plenty of fluid.  If your symptoms not improving by next week please return here or see your PCP.  If anything worsens and you have high fever, chest pain, shortness of breath, nausea/vomiting interfering with oral intake, weakness you need to go to the emergency room.

## 2022-02-23 ENCOUNTER — Ambulatory Visit
Admission: RE | Admit: 2022-02-23 | Discharge: 2022-02-23 | Disposition: A | Discharge status: Home / Self Care | Payer: Medicare Other | Source: Ambulatory Visit | Attending: Family Medicine | Admitting: Family Medicine

## 2022-02-23 ENCOUNTER — Other Ambulatory Visit: Payer: Self-pay | Admitting: Family Medicine

## 2022-02-23 DIAGNOSIS — R059 Cough, unspecified: Secondary | ICD-10-CM

## 2022-06-13 ENCOUNTER — Inpatient Hospital Stay: Payer: 59

## 2022-06-13 ENCOUNTER — Other Ambulatory Visit: Payer: Self-pay

## 2022-06-13 ENCOUNTER — Encounter: Payer: Self-pay | Admitting: Physician Assistant

## 2022-06-13 ENCOUNTER — Inpatient Hospital Stay: Payer: 59 | Attending: Physician Assistant | Admitting: Physician Assistant

## 2022-06-13 VITALS — BP 139/74 | HR 56 | Temp 97.3°F | Resp 20 | Wt 227.5 lb

## 2022-06-13 DIAGNOSIS — E785 Hyperlipidemia, unspecified: Secondary | ICD-10-CM | POA: Diagnosis not present

## 2022-06-13 DIAGNOSIS — I2510 Atherosclerotic heart disease of native coronary artery without angina pectoris: Secondary | ICD-10-CM | POA: Diagnosis not present

## 2022-06-13 DIAGNOSIS — J449 Chronic obstructive pulmonary disease, unspecified: Secondary | ICD-10-CM | POA: Diagnosis not present

## 2022-06-13 DIAGNOSIS — Z85038 Personal history of other malignant neoplasm of large intestine: Secondary | ICD-10-CM

## 2022-06-13 DIAGNOSIS — Z79899 Other long term (current) drug therapy: Secondary | ICD-10-CM | POA: Insufficient documentation

## 2022-06-13 DIAGNOSIS — K1370 Unspecified lesions of oral mucosa: Secondary | ICD-10-CM

## 2022-06-13 DIAGNOSIS — I10 Essential (primary) hypertension: Secondary | ICD-10-CM | POA: Insufficient documentation

## 2022-06-13 DIAGNOSIS — F17210 Nicotine dependence, cigarettes, uncomplicated: Secondary | ICD-10-CM | POA: Diagnosis not present

## 2022-06-13 DIAGNOSIS — Z7982 Long term (current) use of aspirin: Secondary | ICD-10-CM | POA: Insufficient documentation

## 2022-06-13 LAB — CMP (CANCER CENTER ONLY)
ALT: 21 U/L (ref 0–44)
AST: 18 U/L (ref 15–41)
Albumin: 4.1 g/dL (ref 3.5–5.0)
Alkaline Phosphatase: 98 U/L (ref 38–126)
Anion gap: 5 (ref 5–15)
BUN: 18 mg/dL (ref 8–23)
CO2: 27 mmol/L (ref 22–32)
Calcium: 9.6 mg/dL (ref 8.9–10.3)
Chloride: 106 mmol/L (ref 98–111)
Creatinine: 1.42 mg/dL — ABNORMAL HIGH (ref 0.61–1.24)
GFR, Estimated: 53 mL/min — ABNORMAL LOW (ref >60)
Glucose, Bld: 99 mg/dL (ref 70–99)
Potassium: 4.4 mmol/L (ref 3.5–5.1)
Sodium: 138 mmol/L (ref 135–145)
Total Bilirubin: 0.6 mg/dL (ref 0.3–1.2)
Total Protein: 7.4 g/dL (ref 6.5–8.1)

## 2022-06-13 LAB — CBC WITH DIFFERENTIAL (CANCER CENTER ONLY)
Abs Immature Granulocytes: 0.02 10*3/uL (ref 0.00–0.07)
Basophils Absolute: 0.1 10*3/uL (ref 0.0–0.1)
Basophils Relative: 1 %
Eosinophils Absolute: 0.2 10*3/uL (ref 0.0–0.5)
Eosinophils Relative: 2 %
HCT: 43.6 % (ref 39.0–52.0)
Hemoglobin: 14.5 g/dL (ref 13.0–17.0)
Immature Granulocytes: 0 %
Lymphocytes Relative: 30 %
Lymphs Abs: 2.3 10*3/uL (ref 0.7–4.0)
MCH: 31.9 pg (ref 26.0–34.0)
MCHC: 33.3 g/dL (ref 30.0–36.0)
MCV: 95.8 fL (ref 80.0–100.0)
Monocytes Absolute: 0.6 10*3/uL (ref 0.1–1.0)
Monocytes Relative: 7 %
Neutro Abs: 4.5 10*3/uL (ref 1.7–7.7)
Neutrophils Relative %: 60 %
Platelet Count: 147 10*3/uL — ABNORMAL LOW (ref 150–400)
RBC: 4.55 MIL/uL (ref 4.22–5.81)
RDW: 14.1 % (ref 11.5–15.5)
WBC Count: 7.6 10*3/uL (ref 4.0–10.5)
nRBC: 0 % (ref 0.0–0.2)

## 2022-06-13 NOTE — Progress Notes (Signed)
[facility] [phone]   [phone]  INITIAL CONSULTATION:  Patient Care Team: Thomes Dinning, MD as PCP - General (Internal Medicine)  CHIEF COMPLAINTS/PURPOSE OF CONSULTATION:  Oral lesion  HISTORY OF PRESENTING ILLNESS:  [name] [name] 72 y.o. male with medical history significant for colon cancer s/p resection and chemotherapy, COPD, CAD, hyperlipidemia and hypertension presents to the diagnostic clinic for evaluation of oral lesion.   On review of the previous records, [name] [name] presented to his PCP on 06/09/2022 due to an oral lesion located on left side of his oral mucosa that was present for several months which recently began to bleed.    [name] [name] reports the area in question has healed and is not causing him any discomfort anymore. He does have chronic use of dentures without any recent changes. He denies any difficulty with chewing or eating. He denies any repeat bleeding episodes since initial presentation. He does have chronic fatigue that can affect his ADLs. He denies nausea, vomiting or bowel habit changes.He doe shave shortness of breath but was recently diagnosed with COPD. He smokes 1-2 cigarettes a day. He denies fevers, chills, sweats, chest pain, cough, headaches, dizziness, peripheral neuropathy or edema. He has no other complaints. Rest of the 10 point ROS is below.  MEDICAL HISTORY:  Past Medical History:  Diagnosis Date   Anginal pain    Cancer    colon   Clotting disorder    blood clot in neck   Colon cancer    Coronary artery disease    Hepatitis    c   Hyperlipidemia    Hypertension    Osteoarthritis of left knee 06/21/2011   Shortness of breath     SURGICAL HISTORY: Past Surgical History:  Procedure Laterality Date   CARDIAC CATHETERIZATION  12/31/2015   CARDIAC CATHETERIZATION N/A 12/31/2015   Procedure: Left Heart Cath and Coronary Angiography;  Surgeon: Charolette Forward,  MD;  Location: [facility];  Service: Cardiovascular;  Laterality: N/A;   CARDIAC CATHETERIZATION N/A 12/31/2015   Procedure: Coronary Balloon Angioplasty;  Surgeon: Charolette Forward, MD;  Location: [facility];  Service: Cardiovascular;  Laterality: N/A;   COLON SURGERY     2002   colon  ca   dr Bubba Camp      chemo  tx   LEFT HEART CATH AND CORONARY ANGIOGRAPHY N/A 07/26/2019   Procedure: LEFT HEART CATH AND CORONARY ANGIOGRAPHY;  Surgeon: Charolette Forward, MD;  Location: [facility];  Service: Cardiovascular;  Laterality: N/A;   LEFT HEART CATHETERIZATION WITH CORONARY ANGIOGRAM N/A 11/24/2011   Procedure: LEFT HEART CATHETERIZATION WITH CORONARY ANGIOGRAM;  Surgeon: Clent Demark, MD;  Location: [facility];  Service: Cardiovascular;  Laterality: N/A;   PERCUTANEOUS CORONARY STENT INTERVENTION (PCI-S)  11/24/2011   Procedure: PERCUTANEOUS CORONARY STENT INTERVENTION (PCI-S);  Surgeon: Clent Demark, MD;  Location: [facility];  Service: Cardiovascular;;   REPLACEMENT TOTAL KNEE     bilateral knee replacements    SOCIAL HISTORY: Social History   Socioeconomic History   Marital status: Married    Spouse name: Not on file   Number of children: Not on file   Years of education: Not on file   Highest education level: Not on file  Occupational History   Not on file  Tobacco Use   Smoking status: Some Days    Years: 50    Types: Cigarettes   Smokeless tobacco: Never  Tobacco comments:    1-2 cigarettes/day  Vaping Use   Vaping Use: Never used  Substance and Sexual Activity   Alcohol use: Yes    Alcohol/week: 1.0 standard drink of alcohol    Types: 1 Cans of beer per week   Drug use: Yes    Types: Cocaine   Sexual activity: Not on file  Other Topics Concern   Not on file  Social History Narrative   Not on file   Social Determinants of Health   Financial Resource Strain: Not on file  Food Insecurity: Not on file  Transportation Needs: Not on file   Physical Activity: Not on file  Stress: Not on file  Social Connections: Not on file  Intimate Partner Violence: Not on file    FAMILY HISTORY: Family History  Problem Relation Age of Onset   Hypertension Mother    Colon cancer Neg Hx    Esophageal cancer Neg Hx    Rectal cancer Neg Hx    Stomach cancer Neg Hx     ALLERGIES:  is allergic to acyclovir and related and sulfonamide derivatives.  MEDICATIONS:  Current Outpatient Medications  Medication Sig Dispense Refill   albuterol (PROVENTIL HFA;VENTOLIN HFA) 108 (90 Base) MCG/ACT inhaler Inhale 1-2 puffs into the lungs every 6 (six) hours as needed for wheezing or shortness of breath.     aspirin EC 81 MG EC tablet Take 1 tablet (81 mg total) by mouth daily. 30 tablet 3   atorvastatin (LIPITOR) 80 MG tablet Take 80 mg by mouth daily.     metoprolol succinate (TOPROL-XL) 25 MG 24 hr tablet Take 1 tablet (25 mg total) by mouth daily. 30 tablet 3   nitroGLYCERIN (NITRODUR - DOSED IN MG/24 HR) 0.4 mg/hr patch Place 0.4 mg onto the skin daily.     nitroGLYCERIN (NITROSTAT) 0.4 MG SL tablet Place 1 tablet (0.4 mg total) under the tongue every 5 (five) minutes as needed for chest pain. 25 tablet 12   sacubitril-valsartan (ENTRESTO) 24-26 MG Take 1 tablet by mouth 2 (two) times daily. 60 tablet 3   amLODipine (NORVASC) 2.5 MG tablet Take 1 tablet (2.5 mg total) by mouth daily. 30 tablet 11   No current facility-administered medications for this visit.    REVIEW OF SYSTEMS:   Constitutional: ( - ) fevers, ( - )  chills , ( - ) night sweats Eyes: ( - ) blurriness of vision, ( - ) double vision, ( - ) watery eyes Ears, nose, mouth, throat, and face: ( - ) mucositis, ( - ) sore throat Respiratory: ( - ) cough, ( - ) dyspnea, ( - ) wheezes Cardiovascular: ( - ) palpitation, ( - ) chest discomfort, ( - ) lower extremity swelling Gastrointestinal:  ( - ) nausea, ( - ) heartburn, ( - ) change in bowel habits Skin: ( - ) abnormal skin  rashes Lymphatics: ( - ) new lymphadenopathy, ( - ) easy bruising Neurological: ( - ) numbness, ( - ) tingling, ( - ) new weaknesses Behavioral/Psych: ( - ) mood change, ( - ) new changes  All other systems were reviewed with the patient and are negative.  PHYSICAL EXAMINATION: ECOG PERFORMANCE STATUS: 1 - Symptomatic but completely ambulatory  Vitals:   06/13/22 0940  BP: 139/74  Pulse: (!) 56  Resp: 20  Temp: (!) 97.3 F (36.3 C)  SpO2: 99%   Filed Weights   06/13/22 0940  Weight: 227 lb 8 oz (103.2 kg)  GENERAL: well appearing male in NAD  SKIN: skin color, texture, turgor are normal, no rashes or significant lesions EYES: conjunctiva are pink and non-injected, sclera clear OROPHARYNX: no exudate, no erythema; lips, buccal mucosa, and tongue normal  NECK: supple, non-tender LYMPH:  no palpable lymphadenopathy in the cervical or supraclavicular lymph nodes.  LUNGS: clear to auscultation and percussion with normal breathing effort HEART: regular rate & rhythm and no murmurs and no lower extremity edema Musculoskeletal: no cyanosis of digits and no clubbing  PSYCH: alert & oriented x 3, fluent speech NEURO: no focal motor/sensory deficits  LABORATORY DATA:  I have reviewed the data as listed    Latest Ref Rng & Units 06/13/2022   10:56 AM 07/26/2019    5:44 AM 07/25/2019    8:58 AM  CBC  WBC 4.0 - 10.5 K/uL 7.6  8.5  8.5   Hemoglobin 13.0 - 17.0 g/dL 14.5  13.0  14.3   Hematocrit 39.0 - 52.0 % 43.6  40.3  44.5   Platelets 150 - 400 K/uL 147  179  252        Latest Ref Rng & Units 06/13/2022   10:56 AM 07/26/2019    5:44 AM 07/23/2019    4:04 AM  CMP  Glucose 70 - 99 mg/dL 99  96  104   BUN 8 - 23 mg/dL 18  13  17    Creatinine 0.61 - 1.24 mg/dL 1.42  1.45  1.52   Sodium 135 - 145 mmol/L 138  137  138   Potassium 3.5 - 5.1 mmol/L 4.4  4.1  4.2   Chloride 98 - 111 mmol/L 106  108  107   CO2 22 - 32 mmol/L 27  20  21    Calcium 8.9 - 10.3 mg/dL 9.6  8.5  8.6    Total Protein 6.5 - 8.1 g/dL 7.4     Total Bilirubin 0.3 - 1.2 mg/dL 0.6     Alkaline Phos 38 - 126 U/L 98     AST 15 - 41 U/L 18     ALT 0 - 44 U/L 21         ASSESSMENT & PLAN [name] [name] is a 72 y.o. male who presents to the diagnostic clinic for concerning oral lesion. Dr. Lorenso Courier and I both examined his oral cavity without any concerning findings except for mild discoloration in the left buccal mucosa. No bleeding, ulceration or pain with palpation. Recommend a complete examination with ENT specialist so referral will be placed. Labs today to check baseline CBC and CMP. If there is biopsy proven malignancy, we will order PET scan for staging. Patient expressed understanding with the plan provided.   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 Last colonoscopy was in December 201 with only one polyp removed, pathology not available.  Currently under the care of [facility] with plans to undergo repeat colonoscopy in November 2024. Not further medical oncology surveillance is recommended at this time.   Follow up: --RTC as needed   Orders Placed This Encounter  Procedures   CBC with Differential ([place] [facility] Only)    Standing Status:   Future    Number of Occurrences:   1    Standing Expiration Date:   06/13/2023   CMP ([place] only)    Standing Status:   Future    Number of Occurrences:  1    Standing Expiration Date:   06/13/2023   Ambulatory referral to ENT    Referral Priority:   Routine    Referral Type:   Consultation    Referral Reason:   Specialty Services Required    Requested Specialty:   Otolaryngology    Number of Visits Requested:   1    All questions were answered. The patient knows to call the clinic with any problems, questions or concerns.  I have spent a total of 45 minutes minutes of face-to-face and non-face-to-face time,  preparing to see the patient, obtaining and/or reviewing separately obtained history, performing a medically appropriate examination, counseling and educating the patient, ordering tests/procedures, referring and communicating with other health care professionals, documenting clinical information in the electronic health record, and care coordination.   Dede Query, PA-C Department of Hematology/Oncology [place] at [name] Phone: [phone]  Patient was seen with Dr. Lorenso Courier  I have read the above note and personally examined the patient. I agree with the assessment and plan as noted above.  Briefly [name] [name] is a 72 year old male who presents for evaluation of an upper lip lesion.  The patient reports that it was quite severe and caused quite a lot of bleeding and at 1 point in time it was a "hole in his lip".  Remarkably on exam today there is virtually nothing left of this lesion.  He does have some mild discoloration on the upper portion of his lip but does not have any clear lesion.  No palpable abnormality.  At this time would recommend baseline level labs with consideration of referral to ENT to further evaluate his upper lip to assure that there are no abnormalities.  The patient voiced understanding of our findings and the plan moving forward.   Ledell Peoples, MD Department of Hematology/Oncology [place] at [facility] Phone: [phone] Pager: [phone] Email: [email or web address]

## 2022-06-14 ENCOUNTER — Telehealth: Payer: Self-pay

## 2022-06-14 NOTE — Telephone Encounter (Signed)
can you send referral to ENT please   IT   Referral faxed to [facility]  confirmation received

## 2022-07-18 ENCOUNTER — Other Ambulatory Visit (INDEPENDENT_AMBULATORY_CARE_PROVIDER_SITE_OTHER): Payer: 59

## 2022-07-18 ENCOUNTER — Ambulatory Visit (INDEPENDENT_AMBULATORY_CARE_PROVIDER_SITE_OTHER): Payer: 59 | Admitting: Orthopedic Surgery

## 2022-07-18 DIAGNOSIS — M79645 Pain in left finger(s): Secondary | ICD-10-CM

## 2022-07-18 DIAGNOSIS — G8929 Other chronic pain: Secondary | ICD-10-CM

## 2022-07-18 DIAGNOSIS — M25511 Pain in right shoulder: Secondary | ICD-10-CM

## 2022-07-18 DIAGNOSIS — M65312 Trigger thumb, left thumb: Secondary | ICD-10-CM

## 2022-08-04 ENCOUNTER — Encounter: Payer: Self-pay | Admitting: Orthopedic Surgery

## 2022-08-04 DIAGNOSIS — G8929 Other chronic pain: Secondary | ICD-10-CM

## 2022-08-04 DIAGNOSIS — M65312 Trigger thumb, left thumb: Secondary | ICD-10-CM

## 2022-08-04 DIAGNOSIS — M25511 Pain in right shoulder: Secondary | ICD-10-CM

## 2022-08-04 MED ORDER — METHYLPREDNISOLONE ACETATE 40 MG/ML IJ SUSP
20.0000 mg | INTRAMUSCULAR | Status: AC | PRN
  Administered 2022-08-04: 20 mg

## 2022-08-04 MED ORDER — METHYLPREDNISOLONE ACETATE 40 MG/ML IJ SUSP
40.0000 mg | INTRAMUSCULAR | Status: AC | PRN
  Administered 2022-08-04: 40 mg via INTRA_ARTICULAR

## 2022-08-04 MED ORDER — LIDOCAINE HCL 1 % IJ SOLN
5.0000 mL | INTRAMUSCULAR | Status: AC | PRN
  Administered 2022-08-04: 5 mL

## 2022-08-04 MED ORDER — LIDOCAINE HCL 1 % IJ SOLN
0.5000 mL | INTRAMUSCULAR | Status: AC | PRN
  Administered 2022-08-04: .5 mL

## 2022-08-04 NOTE — Progress Notes (Signed)
Office Visit Note   Patient: [name] [name]           Date of Birth: 20-Mar-1950           MRN: [id number] Visit Date: 07/18/2022              Requested by: Worthy Rancher, MD [phone] W. [address] [place],  [place] [address] PCP: Worthy Rancher, MD  Chief Complaint  Patient presents with   Left Thumb - Pain   Right Shoulder - Pain      HPI: Patient is a 72 year old gentleman who complains of triggering of the left thumb.  Patient also complains of chronic right shoulder pain.  Patient denies any injury to the right shoulder he states that it stiff.  Patient states he does have kidney disease.  Assessment & Plan: Visit Diagnoses:  1. Chronic right shoulder pain   2. Chronic pain of left thumb   3. Trigger thumb, left thumb     Plan: Left A1 pulley was injected right subacromial space was injected.  Follow-Up Instructions: Return if symptoms worsen or fail to improve.   Ortho Exam  Patient is alert, oriented, no adenopathy, well-dressed, normal affect, normal respiratory effort. Examination patient has palpable triggering at the A1 pulley left thumb.  The scaphoid scapholunate and TFCC are nontender to palpation.  Semination of the right shoulder patient has pain with Neer and Hawkins impingement test.  Pain to palpation of the biceps tendon.  Imaging: No results found. No images are attached to the encounter.  Labs: Lab Results  Component Value Date   ESRSEDRATE 121 (H) 01/22/2011   CRP 12.88 (H) 01/22/2011   LABURIC 7.1 (H) 08/26/2006   REPTSTATUS 12/28/2017 FINAL 12/27/2017   GRAMSTAIN  01/23/2011    FEW WBC PRESENT,BOTH PMN AND MONONUCLEAR NO ORGANISMS SEEN Gram Stain Report Called to,Read Back By and Verified With: Gram Stain Report Called to,Read Back By and Verified With: DAY S RN 1200 01/23/11 SCALES N Performed at [facility]   GRAMSTAIN  01/23/2011    FEW WBC PRESENT,BOTH PMN AND MONONUCLEAR NO ORGANISMS SEEN Gram Stain Report Called  to,Read Back By and Verified With: DAY S,RN 1200 01/23/11 SCALES H   CULT  12/27/2017    NO GROWTH Performed at [facility], [address], [place], [place] [address]      Lab Results  Component Value Date   ALBUMIN 4.1 06/13/2022   ALBUMIN 3.4 (L) 08/02/2013   ALBUMIN 3.3 (L) 12/19/2012   PREALBUMIN 9.0 (L) 01/22/2011   PREALBUMIN 6.4 (L) 01/21/2011    Lab Results  Component Value Date   MG 2.2 01/24/2011   MG 2.3 01/23/2011   MG 2.4 01/22/2011   No results found for: "VD25OH"  Lab Results  Component Value Date   PREALBUMIN 9.0 (L) 01/22/2011   PREALBUMIN 6.4 (L) 01/21/2011      Latest Ref Rng & Units 06/13/2022   10:56 AM 07/26/2019    5:44 AM 07/25/2019    8:58 AM  CBC EXTENDED  WBC 4.0 - 10.5 K/uL 7.6  8.5  8.5   RBC 4.22 - 5.81 MIL/uL 4.55  4.13  4.54   Hemoglobin 13.0 - 17.0 g/dL 41.3  24.4  01.0   HCT 39.0 - 52.0 % 43.6  40.3  44.5   Platelets 150 - 400 K/uL 147  179  252   NEUT# 1.7 - 7.7 K/uL 4.5     Lymph# 0.7 - 4.0 K/uL 2.3  There is no height or weight on file to calculate BMI.  Orders:  Orders Placed This Encounter  Procedures   XR Hand Complete Left   XR Shoulder Right   No orders of the defined types were placed in this encounter.    Procedures: Hand/UE Inj: L thumb A1 for trigger finger on 08/04/2022 7:56 AM Indications: diagnostic and therapeutic Details: 22 G needle Medications: 0.5 mL lidocaine 1 %; 20 mg methylPREDNISolone acetate 40 MG/ML Outcome: tolerated well, no immediate complications Procedure, treatment alternatives, risks and benefits explained, specific risks discussed. Consent was given by the patient. Immediately prior to procedure a time out was called to verify the correct patient, procedure, equipment, support staff and site/side marked as required. Patient was prepped and draped in the usual sterile fashion.    Large Joint Inj: R subacromial bursa on 08/04/2022 7:57 AM Indications: diagnostic evaluation and  pain Details: 22 G 1.5 in needle, posterior approach  Arthrogram: No  Medications: 5 mL lidocaine 1 %; 40 mg methylPREDNISolone acetate 40 MG/ML Outcome: tolerated well, no immediate complications Procedure, treatment alternatives, risks and benefits explained, specific risks discussed. Consent was given by the patient. Immediately prior to procedure a time out was called to verify the correct patient, procedure, equipment, support staff and site/side marked as required. Patient was prepped and draped in the usual sterile fashion.      Clinical Data: No additional findings.  ROS:  All other systems negative, except as noted in the HPI. Review of Systems  Objective: Vital Signs: There were no vitals taken for this visit.  Specialty Comments:  No specialty comments available.  PMFS History: Patient Active Problem List   Diagnosis Date Noted   Antiplatelet or antithrombotic long-term use 01/08/2020   Acute coronary syndrome (HCC) 07/22/2019   Cancer (HCC)    Unstable angina (HCC) 12/31/2015   History of colon cancer 10/30/2014   ACS (acute coronary syndrome) (HCC) 08/02/2013   Urinary frequency 10/19/2011   Acute blood loss anemia 06/22/2011   Hyponatremia 06/22/2011   Osteoarthritis of left knee 06/21/2011   Primary osteoarthritis of left knee 01/17/2011   CERUMEN IMPACTION, RIGHT 08/19/2009   SEBACEOUS CYST, INFECTED 10/01/2007   CARBUNCLE AND FURUNCLE OF TRUNK 09/27/2007   NUMBNESS 09/27/2007   MUSCLE SPASM, TRAPEZIUS MUSCLE, RIGHT 09/05/2007   Acute hepatitis C virus infection 05/29/2007   HYPERLIPIDEMIA 05/29/2007   HYPERTENSION 05/29/2007   HYPERTENSION, BENIGN 05/02/2007   CORONARY ARTERY DISEASE 03/14/2001   Past Medical History:  Diagnosis Date   Anginal pain (HCC)    Cancer (HCC)    colon   Clotting disorder (HCC)    blood clot in neck   Colon cancer (HCC)    Coronary artery disease    Hepatitis    c   Hyperlipidemia    Hypertension     Osteoarthritis of left knee 06/21/2011   Shortness of breath     Family History  Problem Relation Age of Onset   Hypertension Mother    Colon cancer Neg Hx    Esophageal cancer Neg Hx    Rectal cancer Neg Hx    Stomach cancer Neg Hx     Past Surgical History:  Procedure Laterality Date   CARDIAC CATHETERIZATION  12/31/2015   CARDIAC CATHETERIZATION N/A 12/31/2015   Procedure: Left Heart Cath and Coronary Angiography;  Surgeon: Rinaldo Cloud, MD;  Location: [facility] INVASIVE CV LAB;  Service: Cardiovascular;  Laterality: N/A;   CARDIAC CATHETERIZATION N/A 12/31/2015   Procedure: Coronary Balloon  Angioplasty;  Surgeon: Rinaldo Cloud, MD;  Location: [facility] INVASIVE CV LAB;  Service: Cardiovascular;  Laterality: N/A;   COLON SURGERY     2002   colon  ca   dr Lurene Shadow      chemo  tx   LEFT HEART CATH AND CORONARY ANGIOGRAPHY N/A 07/26/2019   Procedure: LEFT HEART CATH AND CORONARY ANGIOGRAPHY;  Surgeon: Rinaldo Cloud, MD;  Location: [facility] INVASIVE CV LAB;  Service: Cardiovascular;  Laterality: N/A;   LEFT HEART CATHETERIZATION WITH CORONARY ANGIOGRAM N/A 11/24/2011   Procedure: LEFT HEART CATHETERIZATION WITH CORONARY ANGIOGRAM;  Surgeon: Robynn Pane, MD;  Location: [facility] CATH LAB;  Service: Cardiovascular;  Laterality: N/A;   PERCUTANEOUS CORONARY STENT INTERVENTION (PCI-S)  11/24/2011   Procedure: PERCUTANEOUS CORONARY STENT INTERVENTION (PCI-S);  Surgeon: Robynn Pane, MD;  Location: [facility] CATH LAB;  Service: Cardiovascular;;   REPLACEMENT TOTAL KNEE     bilateral knee replacements   Social History   Occupational History   Not on file  Tobacco Use   Smoking status: Some Days    Years: 50    Types: Cigarettes   Smokeless tobacco: Never   Tobacco comments:    1-2 cigarettes/day  Vaping Use   Vaping Use: Never used  Substance and Sexual Activity   Alcohol use: Yes    Alcohol/week: 1.0 standard drink of alcohol    Types: 1 Cans of beer per week   Drug use: Yes    Types: Cocaine   Sexual  activity: Not on file

## 2022-09-06 ENCOUNTER — Other Ambulatory Visit: Payer: Self-pay | Admitting: Family Medicine

## 2022-09-06 DIAGNOSIS — K7460 Unspecified cirrhosis of liver: Secondary | ICD-10-CM

## 2022-09-23 ENCOUNTER — Encounter: Payer: Self-pay | Admitting: Internal Medicine

## 2022-09-27 ENCOUNTER — Ambulatory Visit
Admission: RE | Admit: 2022-09-27 | Discharge: 2022-09-27 | Disposition: A | Discharge status: Home / Self Care | Payer: 59 | Source: Ambulatory Visit | Attending: Family Medicine | Admitting: Family Medicine

## 2022-09-27 DIAGNOSIS — K7460 Unspecified cirrhosis of liver: Secondary | ICD-10-CM

## 2023-01-13 ENCOUNTER — Ambulatory Visit (AMBULATORY_SURGERY_CENTER): Payer: 59

## 2023-01-13 VITALS — Ht 71.0 in | Wt 230.0 lb

## 2023-01-13 DIAGNOSIS — Z85038 Personal history of other malignant neoplasm of large intestine: Secondary | ICD-10-CM

## 2023-01-13 DIAGNOSIS — Z860100 Personal history of colon polyps, unspecified: Secondary | ICD-10-CM

## 2023-01-13 MED ORDER — SUTAB 1479-225-188 MG PO TABS: 12.0000 | ORAL_TABLET | ORAL | 0 refills | Status: DC

## 2023-01-13 NOTE — Progress Notes (Signed)

## 2023-02-04 ENCOUNTER — Encounter: Payer: Self-pay | Admitting: Certified Registered Nurse Anesthetist

## 2023-02-07 NOTE — Progress Notes (Unsigned)
[facility] History and Physical   Primary Care Physician:  Ellyn Hack, MD   Reason for Procedure:  History of colon cancer and colon polyps  Plan:    Colonoscopy     HPI: [name] [name] is a 72 y.o. male with a remote history of colon cancer resected in 2002, whose last colonoscopy was in 2021 performed by Dr. Orvan Falconer with the following polyp findings:  - One 5 mm polyp in the descending colon, removed                            with a cold snare. Resected and retrieved.                           - One 2 mm polyp in the transverse colon, removed                            with a cold snare. Resected and retrieved.                           - Two 4 to 10 mm polyps in the ascending colon,                            removed with a cold snare. Resected and retrieved.  1. Surgical [P], colon, ascending, transverse, polyp (3) - TUBULAR ADENOMA (2 OF 4 FRAGMENTS) - BENIGN COLONIC MUCOSA (2 OF 4 FRAGMENTS) - NO HIGH GRADE DYSPLASIA OR MALIGNANCY IDENTIFIED 2. Surgical [P], colon, descending, polyp - TUBULAR ADENOMA (1 OF 1 FRAGMENTS) - NO HIGH GRADE DYSPLASIA OR MALIGNANCY IDENTIFIED Past Medical History:  Diagnosis Date   Anginal pain (HCC)    Cancer (HCC)    colon   Clotting disorder (HCC)    blood clot in neck   Colon cancer (HCC)    Coronary artery disease    Hepatitis    c   Hyperlipidemia    Hypertension    Osteoarthritis of left knee 06/21/2011   Shortness of breath     Past Surgical History:  Procedure Laterality Date   CARDIAC CATHETERIZATION  12/31/2015   CARDIAC CATHETERIZATION N/A 12/31/2015   Procedure: Left Heart Cath and Coronary Angiography;  Surgeon: Rinaldo Cloud, MD;  Location: [facility] INVASIVE CV LAB;  Service: Cardiovascular;  Laterality: N/A;   CARDIAC CATHETERIZATION N/A 12/31/2015   Procedure: Coronary Balloon Angioplasty;  Surgeon: Rinaldo Cloud, MD;  Location: [facility] INVASIVE CV LAB;  Service: Cardiovascular;  Laterality: N/A;    COLON SURGERY     2002   colon  ca   dr Lurene Shadow      chemo  tx   LEFT HEART CATH AND CORONARY ANGIOGRAPHY N/A 07/26/2019   Procedure: LEFT HEART CATH AND CORONARY ANGIOGRAPHY;  Surgeon: Rinaldo Cloud, MD;  Location: [facility]VAS[facility] CV LAB;  Service: Cardiovascular;  Laterality: N/A;   LEFT HEART CATHETERIZATION WITH CORONARY ANGIOGRAM N/A 11/24/2011   Procedure: LEFT HEART CATHETERIZATION WITH CORONARY ANGIOGRAM;  Surgeon: Robynn Pane, MD;  Location: [facility] CATH LAB;  Service: Cardiovascular;  Laterality: N/A;   PERCUTANEOUS CORONARY STENT INTERVENTION (PCI-S)  11/24/2011   Procedure: PERCUTANEOUS CORONARY STENT INTERVENTION (PCI-S);  Surgeon: Robynn Pane, MD;  Location: [facility] CATH LAB;  Service: Cardiovascular;;   REPLACEMENT TOTAL KNEE  bilateral knee replacements    Prior to Admission medications   Medication Sig Start Date End Date Taking? Authorizing Provider  albuterol (PROVENTIL HFA;VENTOLIN HFA) 108 (90 Base) MCG/ACT inhaler Inhale 1-2 puffs into the lungs every 6 (six) hours as needed for wheezing or shortness of breath.    [provider]  amLODipine (NORVASC) 2.5 MG tablet Take 1 tablet (2.5 mg total) by mouth daily. 07/25/19 07/24/20  Rinaldo Cloud, MD  aspirin EC 81 MG EC tablet Take 1 tablet (81 mg total) by mouth daily. 08/04/13   Orpah Cobb, MD  atorvastatin (LIPITOR) 80 MG tablet Take 80 mg by mouth daily. 07/08/19   [provider]  azelastine (OPTIVAR) 0.05 % ophthalmic solution INSTIL 1 DROP IN EACH EYE TWICE DAILY AS NEEDED FOR WATERY EYES. 05/03/22   [provider]  busPIRone (BUSPAR) 7.5 MG tablet Take 7.5 mg by mouth 2 (two) times daily. 12/10/22   [provider]  gabapentin (NEURONTIN) 300 MG capsule Take 300 mg by mouth 2 (two) times daily. 12/26/22   [provider]  hydrocortisone cream 1 % SMARTSIG:Sparingly Topical Twice Daily 09/12/22   [provider]  metoprolol succinate (TOPROL-XL) 25 MG 24 hr tablet Take 1  tablet (25 mg total) by mouth daily. 07/27/19   Rinaldo Cloud, MD  nitroGLYCERIN (NITRODUR - DOSED IN MG/24 HR) 0.4 mg/hr patch Place 0.4 mg onto the skin daily. 07/08/19   [provider]  nitroGLYCERIN (NITROSTAT) 0.4 MG SL tablet Place 1 tablet (0.4 mg total) under the tongue every 5 (five) minutes as needed for chest pain. 01/01/16   Rinaldo Cloud, MD  sacubitril-valsartan (ENTRESTO) 24-26 MG Take 1 tablet by mouth 2 (two) times daily. 07/26/19   Rinaldo Cloud, MD  Sodium Sulfate-Mag Sulfate-KCl (SUTAB) [phone] MG TABS Take 12 tablets by mouth as directed. 01/13/23   Iva Boop, MD    Current Outpatient Medications  Medication Sig Dispense Refill   albuterol (PROVENTIL HFA;VENTOLIN HFA) 108 (90 Base) MCG/ACT inhaler Inhale 1-2 puffs into the lungs every 6 (six) hours as needed for wheezing or shortness of breath.     amLODipine (NORVASC) 2.5 MG tablet Take 1 tablet (2.5 mg total) by mouth daily. 30 tablet 11   aspirin EC 81 MG EC tablet Take 1 tablet (81 mg total) by mouth daily. 30 tablet 3   atorvastatin (LIPITOR) 80 MG tablet Take 80 mg by mouth daily.     azelastine (OPTIVAR) 0.05 % ophthalmic solution INSTIL 1 DROP IN EACH EYE TWICE DAILY AS NEEDED FOR WATERY EYES.     busPIRone (BUSPAR) 7.5 MG tablet Take 7.5 mg by mouth 2 (two) times daily.     gabapentin (NEURONTIN) 300 MG capsule Take 300 mg by mouth 2 (two) times daily.     hydrocortisone cream 1 % SMARTSIG:Sparingly Topical Twice Daily     metoprolol succinate (TOPROL-XL) 25 MG 24 hr tablet Take 1 tablet (25 mg total) by mouth daily. 30 tablet 3   nitroGLYCERIN (NITRODUR - DOSED IN MG/24 HR) 0.4 mg/hr patch Place 0.4 mg onto the skin daily.     nitroGLYCERIN (NITROSTAT) 0.4 MG SL tablet Place 1 tablet (0.4 mg total) under the tongue every 5 (five) minutes as needed for chest pain. 25 tablet 12   sacubitril-valsartan (ENTRESTO) 24-26 MG Take 1 tablet by mouth 2 (two) times daily. 60 tablet 3   Sodium Sulfate-Mag  Sulfate-KCl (SUTAB) [phone] MG TABS Take 12 tablets by mouth as directed. 24 tablet 0  No current facility-administered medications for this visit.    Allergies as of 02/08/2023 - Review Complete 02/04/2023  Allergen Reaction Noted   Acyclovir and related  01/01/2016   Sulfonamide derivatives Rash and Other (See Comments) 09/27/2007    Family History  Problem Relation Age of Onset   Hypertension Mother    Colon cancer Neg Hx    Esophageal cancer Neg Hx    Rectal cancer Neg Hx    Stomach cancer Neg Hx    Colon polyps Neg Hx     Social History   Socioeconomic History   Marital status: Married    Spouse name: Not on file   Number of children: Not on file   Years of education: Not on file   Highest education level: Not on file  Occupational History   Not on file  Tobacco Use   Smoking status: Some Days    Types: Cigarettes   Smokeless tobacco: Never   Tobacco comments:    1-2 cigarettes/day  Vaping Use   Vaping status: Never Used  Substance and Sexual Activity   Alcohol use: Yes    Alcohol/week: 1.0 standard drink of alcohol    Types: 1 Cans of beer per week   Drug use: Yes    Types: Cocaine   Sexual activity: Not on file  Other Topics Concern   Not on file  Social History Narrative   Not on file   Social Determinants of Health   Financial Resource Strain: Not on file  Food Insecurity: Low Risk  (06/24/2022)   [facility]   Hunger Vital Sign    Worried About Running Out of Food in the Last Year: Never true    Ran Out of Food in the Last Year: Never true  Transportation Needs: Not on file (06/24/2022)  Physical Activity: Not on file  Stress: Not on file  Social Connections: Not on file  Intimate Partner Violence: Not on file    Review of Systems: Positive for *** All other review of systems negative except as mentioned in the HPI.  Physical Exam: Vital signs There were no vitals taken for this visit.  General:    Alert,  Well-developed, well-nourished, pleasant and cooperative in NAD Lungs:  Clear throughout to auscultation.   Heart:  Regular rate and rhythm; no murmurs, clicks, rubs,  or gallops. Abdomen:  Soft, nontender and nondistended. Normal bowel sounds.   Neuro/Psych:  Alert and cooperative. Normal mood and affect. A and O x 3   @Tomoya Ringwald  Sena Slate, MD, [facility] [phone] (pager) 02/07/2023 6:04 PM@

## 2023-02-08 ENCOUNTER — Encounter: Payer: Self-pay | Admitting: Internal Medicine

## 2023-02-08 ENCOUNTER — Ambulatory Visit: Payer: 59 | Admitting: Internal Medicine

## 2023-02-08 VITALS — BP 116/83 | HR 56 | Temp 97.4°F | Resp 26 | Ht 71.0 in | Wt 230.0 lb

## 2023-02-08 DIAGNOSIS — Z860100 Personal history of colon polyps, unspecified: Secondary | ICD-10-CM

## 2023-02-08 DIAGNOSIS — D123 Benign neoplasm of transverse colon: Secondary | ICD-10-CM | POA: Diagnosis not present

## 2023-02-08 DIAGNOSIS — Z1211 Encounter for screening for malignant neoplasm of colon: Secondary | ICD-10-CM | POA: Diagnosis not present

## 2023-02-08 DIAGNOSIS — Z860101 Personal history of adenomatous and serrated colon polyps: Secondary | ICD-10-CM | POA: Diagnosis not present

## 2023-02-08 DIAGNOSIS — Z85038 Personal history of other malignant neoplasm of large intestine: Secondary | ICD-10-CM | POA: Diagnosis not present

## 2023-02-08 MED ORDER — SODIUM CHLORIDE 0.9 % IV SOLN: 500.0000 mL | Freq: Once | INTRAVENOUS | Status: DC

## 2023-02-08 NOTE — Progress Notes (Signed)
Pt's states no medical or surgical changes since previsit or office visit. 

## 2023-02-08 NOTE — Progress Notes (Signed)
Notified Dr Leone Payor and Charlsie Merles ,CRNA that client took NTG subligual on 01/27/23 for chest pain. No chest pain since. Ok to proceed with procedure.

## 2023-02-08 NOTE — Patient Instructions (Addendum)
I found and removed 3 tiny polyps today. I will let you know pathology results and when to have another routine colonoscopy by mail and/or My Chart.  All else ok.  I appreciate the opportunity to care for you. Iva Boop, MD, [facility]  Await pathology results.  Resume previous diet and medications.  Handout on polyps provided.   YOU HAD AN ENDOSCOPIC PROCEDURE TODAY AT THE  ENDOSCOPY CENTER:   Refer to the procedure report that was given to you for any specific questions about what was found during the examination.  If the procedure report does not answer your questions, please call your gastroenterologist to clarify.  If you requested that your care partner not be given the details of your procedure findings, then the procedure report has been included in a sealed envelope for you to review at your convenience later.  YOU SHOULD EXPECT: Some feelings of bloating in the abdomen. Passage of more gas than usual.  Walking can help get rid of the air that was put into your GI tract during the procedure and reduce the bloating. If you had a lower endoscopy (such as a colonoscopy or flexible sigmoidoscopy) you may notice spotting of blood in your stool or on the toilet paper. If you underwent a bowel prep for your procedure, you may not have a normal bowel movement for a few days.  Please Note:  You might notice some irritation and congestion in your nose or some drainage.  This is from the oxygen used during your procedure.  There is no need for concern and it should clear up in a day or so.  SYMPTOMS TO REPORT IMMEDIATELY:  Following lower endoscopy (colonoscopy or flexible sigmoidoscopy):  Excessive amounts of blood in the stool  Significant tenderness or worsening of abdominal pains  Swelling of the abdomen that is new, acute  Fever of 100F or higher   For urgent or emergent issues, a gastroenterologist can be reached at any hour by calling [phone]. Do not use MyChart  messaging for urgent concerns.    DIET:  We do recommend a small meal at first, but then you may proceed to your regular diet.  Drink plenty of fluids but you should avoid alcoholic beverages for 24 hours.  ACTIVITY:  You should plan to take it easy for the rest of today and you should NOT DRIVE or use heavy machinery until tomorrow (because of the sedation medicines used during the test).    FOLLOW UP: Our staff will call the number listed on your records the next business day following your procedure.  We will call around 7:15- 8:00 am to check on you and address any questions or concerns that you may have regarding the information given to you following your procedure. If we do not reach you, we will leave a message.     If any biopsies were taken you will be contacted by phone or by letter within the next 1-3 weeks.  Please call us at [phone] if you have not heard about the biopsies in 3 weeks.    SIGNATURES/CONFIDENTIALITY: You and/or your care partner have signed paperwork which will be entered into your electronic medical record.  These signatures attest to the fact that that the information above on your After Visit Summary has been reviewed and is understood.  Full responsibility of the confidentiality of this discharge information lies with you and/or your care-partner.

## 2023-02-08 NOTE — Progress Notes (Signed)
Report given to PACU, vss 

## 2023-02-08 NOTE — Progress Notes (Signed)
Called to room to assist during endoscopic procedure.  Patient ID and intended procedure confirmed with present staff. Received instructions for my participation in the procedure from the performing physician.  

## 2023-02-08 NOTE — Op Note (Signed)
[facility] Patient Name: [name] [name] Procedure Date: 02/08/2023 10:51 AM MRN: [id number] Endoscopist: Iva Boop , MD, [id number] Age: 72 Referring MD:  Date of Birth: 1950-12-25 Gender: Male Account #: [id number] Procedure:                Colonoscopy Indications:              Surveillance: Personal history of adenomatous                            polyps on last colonoscopy 3 years ago, High risk                            colon cancer surveillance: Personal history of                            colon cancer, Last colonoscopy: 2021 Medicines:                Monitored Anesthesia Care Procedure:                Pre-Anesthesia Assessment:                           - Prior to the procedure, a History and Physical                            was performed, and patient medications and                            allergies were reviewed. The patient's tolerance of                            previous anesthesia was also reviewed. The risks                            and benefits of the procedure and the sedation                            options and risks were discussed with the patient.                            All questions were answered, and informed consent                            was obtained. Prior Anticoagulants: The patient has                            taken no anticoagulant or antiplatelet agents. ASA                            Grade Assessment: III - A patient with severe                            systemic disease. After reviewing the risks and  benefits, the patient was deemed in satisfactory                            condition to undergo the procedure.                           After obtaining informed consent, the colonoscope                            was passed under direct vision. Throughout the                            procedure, the patient's blood pressure, pulse, and                            oxygen saturations were  monitored continuously. The                            CF [id number] #[id number] was introduced through the anus                            and advanced to the the cecum, identified by                            appendiceal orifice and ileocecal valve. The                            colonoscopy was performed without difficulty. The                            patient tolerated the procedure well. The quality                            of the bowel preparation was good. The ileocecal                            valve, appendiceal orifice, and rectum were                            photographed. The bowel preparation used was SUPREP                            via split dose instruction. Scope In: 11:00:26 AM Scope Out: 11:12:52 AM Scope Withdrawal Time: 0 hours 10 minutes 43 seconds  Total Procedure Duration: 0 hours 12 minutes 26 seconds  Findings:                 The perianal and digital rectal examinations were                            normal.                           Three sessile polyps were found in the distal  transverse colon. The polyps were diminutive in                            size. These polyps were removed with a cold snare.                            Resection was complete, but the polyp tissue was                            only partially retrieved. Verification of patient                            identification for the specimen was done. Estimated                            blood loss was minimal.                           There was evidence of a prior end-to-side                            colo-colonic anastomosis in the sigmoid colon. This                            was patent and was characterized by healthy                            appearing mucosa.                           The exam was otherwise without abnormality on                            direct and retroflexion views. Complications:            No immediate complications. Estimated  Blood Loss:     Estimated blood loss was minimal. Impression:               - Three diminutive polyps in the distal transverse                            colon, removed with a cold snare. Complete                            resection. Partial retrieval.                           - Patent end-to-side colo-colonic anastomosis,                            characterized by healthy appearing mucosa.                           - The examination was otherwise normal on direct  and retroflexion views.                           - Personal history of malignant neoplasm of the                            colon. resected from sigmoid 2202                           - Personal history of colonic polyps. last                            colonoscopy 2021 - had 3 adenomas Recommendation:           - Patient has a contact number available for                            emergencies. The signs and symptoms of potential                            delayed complications were discussed with the                            patient. Return to normal activities tomorrow.                            Written discharge instructions were provided to the                            patient.                           - Resume previous diet.                           - Continue present medications.                           - Await pathology results.                           - Repeat colonoscopy is recommended for                            surveillance. The colonoscopy date will be                            determined after pathology results from today's                            exam become available for review. Iva Boop, MD 02/08/2023 11:19:40 AM This report has been signed electronically.

## 2023-02-13 ENCOUNTER — Telehealth: Payer: Self-pay | Admitting: *Deleted

## 2023-02-13 NOTE — Telephone Encounter (Signed)
Left message on f/u call 

## 2023-02-13 NOTE — Telephone Encounter (Signed)
Patient returning call states he is doing well.

## 2023-02-14 LAB — SURGICAL PATHOLOGY

## 2023-02-16 ENCOUNTER — Encounter: Payer: Self-pay | Admitting: Internal Medicine

## 2023-05-30 ENCOUNTER — Ambulatory Visit
Admission: RE | Admit: 2023-05-30 | Discharge: 2023-05-30 | Disposition: A | Discharge status: Home / Self Care | Source: Ambulatory Visit | Attending: Family Medicine | Admitting: Family Medicine

## 2023-05-30 ENCOUNTER — Other Ambulatory Visit: Payer: Self-pay | Admitting: Family Medicine

## 2023-05-30 DIAGNOSIS — R062 Wheezing: Secondary | ICD-10-CM

## 2024-02-01 ENCOUNTER — Encounter: Payer: Self-pay | Admitting: Oncology

## 2024-02-27 ENCOUNTER — Encounter (HOSPITAL_COMMUNITY): Payer: Self-pay

## 2024-02-27 ENCOUNTER — Other Ambulatory Visit: Payer: Self-pay

## 2024-02-27 ENCOUNTER — Emergency Department (HOSPITAL_COMMUNITY)

## 2024-02-27 ENCOUNTER — Emergency Department (HOSPITAL_COMMUNITY): Admission: EM | Admit: 2024-02-27 | Discharge: 2024-02-27 | Discharge status: Against Medical Advice

## 2024-02-27 DIAGNOSIS — J449 Chronic obstructive pulmonary disease, unspecified: Secondary | ICD-10-CM | POA: Diagnosis not present

## 2024-02-27 DIAGNOSIS — J101 Influenza due to other identified influenza virus with other respiratory manifestations: Secondary | ICD-10-CM | POA: Diagnosis not present

## 2024-02-27 DIAGNOSIS — R059 Cough, unspecified: Secondary | ICD-10-CM | POA: Diagnosis present

## 2024-02-27 LAB — RESP PANEL BY RT-PCR (RSV, FLU A&B, COVID)  RVPGX2
Influenza A by PCR: POSITIVE — AB
Influenza B by PCR: NEGATIVE
Resp Syncytial Virus by PCR: NEGATIVE
SARS Coronavirus 2 by RT PCR: NEGATIVE

## 2024-02-27 LAB — CBC WITH DIFFERENTIAL/PLATELET
Abs Immature Granulocytes: 0.03 K/uL (ref 0.00–0.07)
Basophils Absolute: 0.1 K/uL (ref 0.0–0.1)
Basophils Relative: 1 %
Eosinophils Absolute: 0.1 K/uL (ref 0.0–0.5)
Eosinophils Relative: 1 %
HCT: 45.2 % (ref 39.0–52.0)
Hemoglobin: 15 g/dL (ref 13.0–17.0)
Immature Granulocytes: 0 %
Lymphocytes Relative: 16 %
Lymphs Abs: 1.3 K/uL (ref 0.7–4.0)
MCH: 31.4 pg (ref 26.0–34.0)
MCHC: 33.2 g/dL (ref 30.0–36.0)
MCV: 94.6 fL (ref 80.0–100.0)
Monocytes Absolute: 0.5 K/uL (ref 0.1–1.0)
Monocytes Relative: 7 %
Neutro Abs: 6.1 K/uL (ref 1.7–7.7)
Neutrophils Relative %: 75 %
Platelets: 188 K/uL (ref 150–400)
RBC: 4.78 MIL/uL (ref 4.22–5.81)
RDW: 16.2 % — ABNORMAL HIGH (ref 11.5–15.5)
WBC: 8.1 K/uL (ref 4.0–10.5)
nRBC: 0 % (ref 0.0–0.2)

## 2024-02-27 LAB — BASIC METABOLIC PANEL WITH GFR
Anion gap: 13 (ref 5–15)
BUN: 17 mg/dL (ref 8–23)
CO2: 21 mmol/L — ABNORMAL LOW (ref 22–32)
Calcium: 9.2 mg/dL (ref 8.9–10.3)
Chloride: 103 mmol/L (ref 98–111)
Creatinine, Ser: 1.63 mg/dL — ABNORMAL HIGH (ref 0.61–1.24)
GFR, Estimated: 45 mL/min — ABNORMAL LOW (ref >60)
Glucose, Bld: 95 mg/dL (ref 70–99)
Potassium: 4.1 mmol/L (ref 3.5–5.1)
Sodium: 136 mmol/L (ref 135–145)

## 2024-02-27 NOTE — ED Notes (Signed)
 Pt called x3 for vital signs, no answer.

## 2024-02-27 NOTE — ED Triage Notes (Signed)
 Sob since Friday.  Coughing up yellow sputum.  Denies fevers.

## 2024-02-27 NOTE — ED Provider Triage Note (Signed)
 Emergency Medicine Provider Triage Evaluation Note  [name] [name] , a 73 y.o. male  was evaluated in triage.  Pt complains of productive cough.  Patient reports a productive cough that began on Friday and is getting worse, describes yellow sputum production.  No fever at home.  Reports that he does have a history of COPD for which he takes Trelegy, not on oxygen at home.  Review of Systems  Positive: As above Negative: As above  Physical Exam  BP 126/70   Pulse 88   Temp 99.1 F (37.3 C)   Resp 18   Ht 5' 11 (1.803 m)   Wt 104.3 kg   SpO2 94%   BMI 32.07 kg/m  Gen:   Awake, no distress   Resp:  Normal effort  MSK:   Moves extremities without difficulty  Other:    Medical Decision Making  Medically screening exam initiated at 2:06 PM.  Appropriate orders placed.  [name] [name] was informed that the remainder of the evaluation will be completed by another provider, this initial triage assessment does not replace that evaluation, and the importance of remaining in the ED until their evaluation is complete.     Glendia Rocky SAILOR, [place] 02/27/24 1407

## 2024-02-27 NOTE — ED Triage Notes (Signed)
 C/O [facility] for 4 days. Denies CP. C/O cough since Friday. Axox4.

## 2024-03-13 ENCOUNTER — Other Ambulatory Visit: Payer: Self-pay | Admitting: Family Medicine

## 2024-03-13 ENCOUNTER — Ambulatory Visit
Admission: RE | Admit: 2024-03-13 | Discharge: 2024-03-13 | Disposition: A | Discharge status: Home / Self Care | Source: Ambulatory Visit | Attending: Family Medicine | Admitting: Family Medicine

## 2024-03-13 DIAGNOSIS — R058 Other specified cough: Secondary | ICD-10-CM
